# Patient Record
Sex: Male | Born: 1950
Health system: Southern US, Community
[De-identification: ages and names within clinical notes are randomized; demographics above are authoritative.]

## PROBLEM LIST (undated history)

## (undated) DIAGNOSIS — G8190 Hemiplegia, unspecified affecting unspecified side: Secondary | ICD-10-CM

## (undated) DIAGNOSIS — C4490 Unspecified malignant neoplasm of skin, unspecified: Secondary | ICD-10-CM

## (undated) DIAGNOSIS — I739 Peripheral vascular disease, unspecified: Secondary | ICD-10-CM

## (undated) DIAGNOSIS — E785 Hyperlipidemia, unspecified: Secondary | ICD-10-CM

## (undated) DIAGNOSIS — A809 Acute poliomyelitis, unspecified: Secondary | ICD-10-CM

## (undated) DIAGNOSIS — C801 Malignant (primary) neoplasm, unspecified: Secondary | ICD-10-CM

## (undated) DIAGNOSIS — M1990 Unspecified osteoarthritis, unspecified site: Secondary | ICD-10-CM

## (undated) DIAGNOSIS — J449 Chronic obstructive pulmonary disease, unspecified: Secondary | ICD-10-CM

## (undated) DIAGNOSIS — Z95818 Presence of other cardiac implants and grafts: Secondary | ICD-10-CM

## (undated) DIAGNOSIS — F32A Depression, unspecified: Secondary | ICD-10-CM

## (undated) DIAGNOSIS — J189 Pneumonia, unspecified organism: Secondary | ICD-10-CM

## (undated) DIAGNOSIS — K219 Gastro-esophageal reflux disease without esophagitis: Secondary | ICD-10-CM

## (undated) DIAGNOSIS — I639 Cerebral infarction, unspecified: Secondary | ICD-10-CM

## (undated) DIAGNOSIS — F329 Major depressive disorder, single episode, unspecified: Secondary | ICD-10-CM

## (undated) HISTORY — DX: Unspecified malignant neoplasm of skin, unspecified: C44.90

## (undated) HISTORY — PX: BRAIN TUMOR EXCISION: SHX577

## (undated) HISTORY — DX: Acute poliomyelitis, unspecified: A80.9

## (undated) HISTORY — DX: Unspecified osteoarthritis, unspecified site: M19.90

---

## 2011-04-09 HISTORY — PX: COLONOSCOPY: SHX174

## 2012-02-21 ENCOUNTER — Ambulatory Visit: Payer: Self-pay | Admitting: Unknown Physician Specialty

## 2014-03-14 ENCOUNTER — Ambulatory Visit: Payer: Self-pay | Admitting: Physician Assistant

## 2014-03-17 ENCOUNTER — Encounter: Payer: Self-pay | Admitting: General Surgery

## 2014-03-17 ENCOUNTER — Ambulatory Visit (INDEPENDENT_AMBULATORY_CARE_PROVIDER_SITE_OTHER): Payer: Worker's Compensation | Admitting: General Surgery

## 2014-03-17 VITALS — BP 130/82 | HR 80 | Resp 16 | Ht 67.0 in | Wt 132.0 lb

## 2014-03-17 DIAGNOSIS — K4090 Unilateral inguinal hernia, without obstruction or gangrene, not specified as recurrent: Secondary | ICD-10-CM

## 2014-03-17 NOTE — Progress Notes (Signed)
Patient ID: Jeremy Balfour., male   DOB: [birth year], 63 y.o.   MRN: [id number]  Chief Complaint  Patient presents with  . Hernia    HPI Jeremy Kenley Sr. is a 63 y.o. male.  Here today for evaluation of possible right inguinal hernia. The date of injury was Sunday night at work around 1:30 am on 03-14-14. He states he was lifting a piece of heavy equipment at work when he first noticed the right groin pain and felt "pull". He also states there is swelling in the area. Bowels are regular.  HPI  Past Medical History  Diagnosis Date  . Arthritis pain   . Polio     age 63    Past Surgical History  Procedure Laterality Date  . Colonoscopy  2013    Dr Vira Agar    Family History  Problem Relation Age of Onset  . Cancer Father     colon    Social History History  Substance Use Topics  . Smoking status: Current Every Day Smoker -- 1.00 packs/day for 40 years    Types: Cigarettes  . Smokeless tobacco: Never Used  . Alcohol Use: 0.0 oz/week    0 Not specified per week    No Known Allergies  No current outpatient prescriptions on file.   No current facility-administered medications for this visit.    Review of Systems Review of Systems  Constitutional: Negative.   Respiratory: Negative.   Cardiovascular: Negative.   Gastrointestinal: Negative for diarrhea and constipation.    Blood pressure 130/82, pulse 80, resp. rate 16, height 5\' 7"  (1.702 m), weight 132 lb (59.875 kg).  Physical Exam Physical Exam  Constitutional: He is oriented to person, place, and time. He appears well-developed and well-nourished.  Eyes: Conjunctivae are normal. No scleral icterus.  Neck: Neck supple.  Cardiovascular: Normal rate, regular rhythm and normal heart sounds.   Pulmonary/Chest: Effort normal and breath sounds normal.  Abdominal: Soft. Normal appearance and bowel sounds are normal. There is tenderness. A hernia is present. Hernia confirmed positive in the right  inguinal area.  Reducible right inguinal hernia , mildly tender right groin.   Lymphadenopathy:    He has no cervical adenopathy.  Neurological: He is alert and oriented to person, place, and time.  Skin: Skin is warm and dry.    Data Reviewed Referral note  Assessment    Right inguinal hernia. Likely this is cause of his pain but it is also possible he has a muscular starin from recent lifting. He does state he had no swelling in that area till now. Repair discusse in full. Given he is a a smoker would recommend open repair with mesh and can be done with local/sedation. Pt agreeable.     Plan    Hernia precautions and incarceration were discussed with the patient. If they develop symptoms of an incarcerated hernia, they were encouraged to seek prompt medical attention.  I have recommended repair of the hernia using mesh on an outpatient basis in the near future. The risk of infection was reviewed. The role of prosthetic mesh to minimize the risk of recurrence was reviewed.     Patient's surgery has been scheduled for 03-28-14 at [facility].  PCP:  Alex Gardener 03/17/2014, 2:28 PM

## 2014-03-17 NOTE — Patient Instructions (Addendum)

## 2014-03-28 ENCOUNTER — Ambulatory Visit: Payer: Self-pay | Admitting: General Surgery

## 2014-03-28 DIAGNOSIS — K4090 Unilateral inguinal hernia, without obstruction or gangrene, not specified as recurrent: Secondary | ICD-10-CM | POA: Diagnosis not present

## 2014-03-28 HISTORY — PX: HERNIA REPAIR: SHX51

## 2014-04-04 ENCOUNTER — Encounter: Payer: Self-pay | Admitting: General Surgery

## 2014-04-06 ENCOUNTER — Telehealth: Payer: Self-pay | Admitting: *Deleted

## 2014-04-06 NOTE — Telephone Encounter (Signed)
Advised he may try milk of magnesia or miralax, pt agrees.

## 2014-04-06 NOTE — Telephone Encounter (Signed)
Pt is calling because he had surgery on 03/28/14 and have taken a whole bottle of stool softners and has not went to the bathroom, he wanted to know what he should do.

## 2014-04-07 ENCOUNTER — Telehealth: Payer: Self-pay | Admitting: *Deleted

## 2014-04-07 NOTE — Telephone Encounter (Signed)
He will try dulcolax supp or fleets enema. He has not tried MOM as of yet.

## 2014-04-07 NOTE — Telephone Encounter (Signed)
Pt is still having trouble moving bowels after surgery, he feels bloated. He was wanting to know if there was an enemia he could use.

## 2014-04-14 ENCOUNTER — Encounter: Payer: Self-pay | Admitting: General Surgery

## 2014-04-14 ENCOUNTER — Ambulatory Visit (INDEPENDENT_AMBULATORY_CARE_PROVIDER_SITE_OTHER): Payer: Worker's Compensation | Admitting: General Surgery

## 2014-04-14 VITALS — BP 124/76 | HR 74 | Resp 12 | Ht 66.0 in | Wt 137.0 lb

## 2014-04-14 DIAGNOSIS — K4090 Unilateral inguinal hernia, without obstruction or gangrene, not specified as recurrent: Secondary | ICD-10-CM

## 2014-04-14 NOTE — Patient Instructions (Signed)
Patient to return in 1 month for follow up. Patient advised to start doing more things as tolerated. The patient is aware to call back for any questions or concerns.

## 2014-04-14 NOTE — Progress Notes (Signed)
Patient ID: Jeremy Harrell., male   DOB: 01/01/1951, 64 y.o.   MRN: [id number]  The patient presents for a post op right inguinal hernia repair. The procedure was performed on 03/28/14. He is doing well. No new complaints at this time.  He is still somewhat tender in right groin.   Repair intact. Incision is clean with no signs of infection or seroma  Patient to return in 1 month for follow up. He is asked to increase activity slowly as tolerated.

## 2014-04-15 ENCOUNTER — Encounter: Payer: Self-pay | Admitting: General Surgery

## 2014-05-12 ENCOUNTER — Encounter: Payer: Self-pay | Admitting: General Surgery

## 2014-05-12 ENCOUNTER — Ambulatory Visit: Payer: Self-pay | Admitting: General Surgery

## 2014-05-12 VITALS — BP 122/72 | HR 70 | Resp 12 | Ht 66.0 in | Wt 141.0 lb

## 2014-05-12 DIAGNOSIS — K4090 Unilateral inguinal hernia, without obstruction or gangrene, not specified as recurrent: Secondary | ICD-10-CM

## 2014-05-12 NOTE — Patient Instructions (Signed)
The patient is aware to call back for any questions or concerns.  

## 2014-05-12 NOTE — Progress Notes (Signed)
Here today for postoperative visit, right inguinal hernia 03-28-14. States he is doing well. Bowels moving regular. Still complains of some intermittent pain and also pain in the testicular area.   Exam shows well healed incision with intact repair. Moderate tenderness over the right testicle without any palpable nodule or visible swelling.   Advise use of Aleve daily for 2 wks and also requested Urology consult for testicular tenderness. Discussed fully with patient.  Return to work 05-30-14

## 2014-06-23 ENCOUNTER — Encounter: Payer: Self-pay | Admitting: General Surgery

## 2014-06-23 ENCOUNTER — Ambulatory Visit (INDEPENDENT_AMBULATORY_CARE_PROVIDER_SITE_OTHER): Payer: Self-pay | Admitting: General Surgery

## 2014-06-23 VITALS — BP 120/78 | HR 74 | Resp 12 | Ht 66.0 in | Wt 134.0 lb

## 2014-06-23 DIAGNOSIS — K4090 Unilateral inguinal hernia, without obstruction or gangrene, not specified as recurrent: Secondary | ICD-10-CM

## 2014-06-23 NOTE — Progress Notes (Signed)
Patient is here today for postoperative visit, right inguinal hernia 03-28-14. He states he doing ok.  Still has some pain over right pubic area but is getting better. Incision is intact and healing well. No hernia noted. Patient to return as needed.

## 2014-06-23 NOTE — Patient Instructions (Signed)
Patient to return as needed. 

## 2014-07-11 ENCOUNTER — Ambulatory Visit: Payer: Self-pay | Admitting: General Surgery

## 2014-07-30 NOTE — Op Note (Signed)
PATIENT NAME:  Jeremy Harrell, Jeremy Harrell MR#:  [id number] DATE OF BIRTH:  06/17/1950  DATE OF PROCEDURE:  03/28/2014  PREOPERATIVE DIAGNOSIS: Right inguinal hernia.   POSTOPERATIVE DIAGNOSIS: Right inguinal hernia.     OPERATION: Repair right inguinal hernia.   ANESTHESIA:  Local with a mixture of 0.5% Marcaine and 1% Xylocaine.   COMPLICATIONS: None.   ESTIMATED BLOOD LOSS: Minimal.   DRAINS: None.   DESCRIPTION OF PROCEDURE: The patient was placed in the supine position on the operating table. With adequate sedation and monitoring, the right groin was prepped and draped out as a sterile field. Timeout was performed and the anterior/superior iliac spine and the pubic tubercle area were marked, an incision was made along the medial two thirds of the line connecting these. Local anesthetic was used to a total of about 25-27 mL total. The incision was deepened through down to the external oblique and bleeding was controlled with cautery and ligatures of 3-0 Vicryl. The external oblique was opened along the line of its fibers and the contents were explored. The patient had an indirect sac along with a small lipoma of the cord.  The lipoma was excised and ligated with 3-0 Vicryl. The sac was then dissected free from the surrounding cord structures and it was somewhat adhered in places and this was carefully dissected all the way to the internal ring and a little bit higher. It was then opened to reveal there were no contents. Suture ligation of the sac was performed high with 2-0 Vicryl and was then amputated above this. The suture was used to transfix the stump to the undersurface of the internal oblique muscle. Fascia covering very cord structures was reapproximated with 2-0 Vicryl. The posterior wall was well-exposed. The Parietex Pro Grip mesh was brought up to the field. The edges were trimmed to match the small inguinal canal in this patient and placed around the cord and laid down. The medial end was  tacked to the pubic tubercle with a single stitch of 2-0 PDS and the lateral ends were tucked underneath the external oblique. After ensuring hemostasis, the wound was irrigated and the external oblique closed with running 2-0 PDS. The subcutaneous tissue was closed with 3-0 Vicryl and the skin closed with subcuticular 4-0 Vicryl, reinforced covered with LiquiBand.  The procedure was well tolerated.  He was subsequently extubated and returned to the recovery room in stable condition.    ____________________________ S.Robinette Haines, MD sgs:at D: 03/28/2014 13:23:00 ET T: 03/28/2014 14:51:00 ET JOB#: [id number]  cc: S.G. Jamal Collin, MD, <Dictator> [facility] Robinette Haines MD ELECTRONICALLY SIGNED 03/29/2014 8:44

## 2015-06-11 DIAGNOSIS — K5900 Constipation, unspecified: Secondary | ICD-10-CM | POA: Diagnosis not present

## 2015-06-11 DIAGNOSIS — I63032 Cerebral infarction due to thrombosis of left carotid artery: Secondary | ICD-10-CM | POA: Diagnosis not present

## 2015-06-11 DIAGNOSIS — I69391 Dysphagia following cerebral infarction: Secondary | ICD-10-CM | POA: Diagnosis not present

## 2015-06-11 DIAGNOSIS — I739 Peripheral vascular disease, unspecified: Secondary | ICD-10-CM | POA: Diagnosis not present

## 2015-06-11 DIAGNOSIS — R414 Neurologic neglect syndrome: Secondary | ICD-10-CM | POA: Diagnosis not present

## 2015-06-11 DIAGNOSIS — R471 Dysarthria and anarthria: Secondary | ICD-10-CM | POA: Diagnosis not present

## 2015-06-11 DIAGNOSIS — S0990XA Unspecified injury of head, initial encounter: Secondary | ICD-10-CM | POA: Diagnosis not present

## 2015-06-11 DIAGNOSIS — I160 Hypertensive urgency: Secondary | ICD-10-CM | POA: Diagnosis not present

## 2015-06-11 DIAGNOSIS — I69320 Aphasia following cerebral infarction: Secondary | ICD-10-CM | POA: Diagnosis not present

## 2015-06-11 DIAGNOSIS — J40 Bronchitis, not specified as acute or chronic: Secondary | ICD-10-CM | POA: Diagnosis not present

## 2015-06-11 DIAGNOSIS — H5347 Heteronymous bilateral field defects: Secondary | ICD-10-CM | POA: Diagnosis not present

## 2015-06-11 DIAGNOSIS — I63512 Cerebral infarction due to unspecified occlusion or stenosis of left middle cerebral artery: Secondary | ICD-10-CM | POA: Diagnosis not present

## 2015-06-11 DIAGNOSIS — R4701 Aphasia: Secondary | ICD-10-CM | POA: Diagnosis not present

## 2015-06-11 DIAGNOSIS — R29722 NIHSS score 22: Secondary | ICD-10-CM | POA: Diagnosis not present

## 2015-06-11 DIAGNOSIS — I63232 Cerebral infarction due to unspecified occlusion or stenosis of left carotid arteries: Secondary | ICD-10-CM | POA: Diagnosis not present

## 2015-06-11 DIAGNOSIS — M6281 Muscle weakness (generalized): Secondary | ICD-10-CM | POA: Diagnosis not present

## 2015-06-11 DIAGNOSIS — E785 Hyperlipidemia, unspecified: Secondary | ICD-10-CM | POA: Diagnosis not present

## 2015-06-11 DIAGNOSIS — R29720 NIHSS score 20: Secondary | ICD-10-CM | POA: Diagnosis not present

## 2015-06-11 DIAGNOSIS — I10 Essential (primary) hypertension: Secondary | ICD-10-CM | POA: Diagnosis not present

## 2015-06-11 DIAGNOSIS — I491 Atrial premature depolarization: Secondary | ICD-10-CM | POA: Diagnosis not present

## 2015-06-11 DIAGNOSIS — I639 Cerebral infarction, unspecified: Secondary | ICD-10-CM | POA: Diagnosis not present

## 2015-06-11 DIAGNOSIS — R482 Apraxia: Secondary | ICD-10-CM | POA: Diagnosis not present

## 2015-06-11 DIAGNOSIS — F17210 Nicotine dependence, cigarettes, uncomplicated: Secondary | ICD-10-CM | POA: Diagnosis not present

## 2015-06-11 DIAGNOSIS — R29898 Other symptoms and signs involving the musculoskeletal system: Secondary | ICD-10-CM | POA: Diagnosis not present

## 2015-06-11 DIAGNOSIS — R1310 Dysphagia, unspecified: Secondary | ICD-10-CM | POA: Diagnosis not present

## 2015-06-11 DIAGNOSIS — Z9282 Status post administration of tPA (rtPA) in a different facility within the last 24 hours prior to admission to current facility: Secondary | ICD-10-CM | POA: Diagnosis not present

## 2015-06-11 DIAGNOSIS — R911 Solitary pulmonary nodule: Secondary | ICD-10-CM | POA: Diagnosis not present

## 2015-06-11 DIAGNOSIS — J449 Chronic obstructive pulmonary disease, unspecified: Secondary | ICD-10-CM | POA: Diagnosis not present

## 2015-06-11 DIAGNOSIS — G8191 Hemiplegia, unspecified affecting right dominant side: Secondary | ICD-10-CM | POA: Diagnosis not present

## 2015-06-27 DIAGNOSIS — I69320 Aphasia following cerebral infarction: Secondary | ICD-10-CM | POA: Diagnosis not present

## 2015-06-27 DIAGNOSIS — Z7982 Long term (current) use of aspirin: Secondary | ICD-10-CM | POA: Diagnosis not present

## 2015-06-27 DIAGNOSIS — I69351 Hemiplegia and hemiparesis following cerebral infarction affecting right dominant side: Secondary | ICD-10-CM | POA: Diagnosis not present

## 2015-06-27 DIAGNOSIS — I69322 Dysarthria following cerebral infarction: Secondary | ICD-10-CM | POA: Diagnosis not present

## 2015-06-27 DIAGNOSIS — R1310 Dysphagia, unspecified: Secondary | ICD-10-CM | POA: Diagnosis not present

## 2015-06-27 DIAGNOSIS — Z720 Tobacco use: Secondary | ICD-10-CM | POA: Diagnosis not present

## 2015-06-27 DIAGNOSIS — I69391 Dysphagia following cerebral infarction: Secondary | ICD-10-CM | POA: Diagnosis not present

## 2015-06-28 DIAGNOSIS — I69322 Dysarthria following cerebral infarction: Secondary | ICD-10-CM | POA: Diagnosis not present

## 2015-06-28 DIAGNOSIS — Z7982 Long term (current) use of aspirin: Secondary | ICD-10-CM | POA: Diagnosis not present

## 2015-06-28 DIAGNOSIS — Z720 Tobacco use: Secondary | ICD-10-CM | POA: Diagnosis not present

## 2015-06-28 DIAGNOSIS — I69320 Aphasia following cerebral infarction: Secondary | ICD-10-CM | POA: Diagnosis not present

## 2015-06-28 DIAGNOSIS — R1310 Dysphagia, unspecified: Secondary | ICD-10-CM | POA: Diagnosis not present

## 2015-06-28 DIAGNOSIS — I69391 Dysphagia following cerebral infarction: Secondary | ICD-10-CM | POA: Diagnosis not present

## 2015-06-28 DIAGNOSIS — I69351 Hemiplegia and hemiparesis following cerebral infarction affecting right dominant side: Secondary | ICD-10-CM | POA: Diagnosis not present

## 2015-06-30 DIAGNOSIS — I69351 Hemiplegia and hemiparesis following cerebral infarction affecting right dominant side: Secondary | ICD-10-CM | POA: Diagnosis not present

## 2015-06-30 DIAGNOSIS — Z7982 Long term (current) use of aspirin: Secondary | ICD-10-CM | POA: Diagnosis not present

## 2015-06-30 DIAGNOSIS — I69391 Dysphagia following cerebral infarction: Secondary | ICD-10-CM | POA: Diagnosis not present

## 2015-06-30 DIAGNOSIS — I69322 Dysarthria following cerebral infarction: Secondary | ICD-10-CM | POA: Diagnosis not present

## 2015-06-30 DIAGNOSIS — I69320 Aphasia following cerebral infarction: Secondary | ICD-10-CM | POA: Diagnosis not present

## 2015-06-30 DIAGNOSIS — Z720 Tobacco use: Secondary | ICD-10-CM | POA: Diagnosis not present

## 2015-06-30 DIAGNOSIS — R1310 Dysphagia, unspecified: Secondary | ICD-10-CM | POA: Diagnosis not present

## 2015-07-03 DIAGNOSIS — R1310 Dysphagia, unspecified: Secondary | ICD-10-CM | POA: Diagnosis not present

## 2015-07-03 DIAGNOSIS — Z720 Tobacco use: Secondary | ICD-10-CM | POA: Diagnosis not present

## 2015-07-03 DIAGNOSIS — I69351 Hemiplegia and hemiparesis following cerebral infarction affecting right dominant side: Secondary | ICD-10-CM | POA: Diagnosis not present

## 2015-07-03 DIAGNOSIS — Z7982 Long term (current) use of aspirin: Secondary | ICD-10-CM | POA: Diagnosis not present

## 2015-07-03 DIAGNOSIS — I69322 Dysarthria following cerebral infarction: Secondary | ICD-10-CM | POA: Diagnosis not present

## 2015-07-03 DIAGNOSIS — I69391 Dysphagia following cerebral infarction: Secondary | ICD-10-CM | POA: Diagnosis not present

## 2015-07-03 DIAGNOSIS — I69320 Aphasia following cerebral infarction: Secondary | ICD-10-CM | POA: Diagnosis not present

## 2015-07-06 DIAGNOSIS — I69320 Aphasia following cerebral infarction: Secondary | ICD-10-CM | POA: Diagnosis not present

## 2015-07-06 DIAGNOSIS — I69322 Dysarthria following cerebral infarction: Secondary | ICD-10-CM | POA: Diagnosis not present

## 2015-07-06 DIAGNOSIS — R1310 Dysphagia, unspecified: Secondary | ICD-10-CM | POA: Diagnosis not present

## 2015-07-06 DIAGNOSIS — I69351 Hemiplegia and hemiparesis following cerebral infarction affecting right dominant side: Secondary | ICD-10-CM | POA: Diagnosis not present

## 2015-07-06 DIAGNOSIS — Z7982 Long term (current) use of aspirin: Secondary | ICD-10-CM | POA: Diagnosis not present

## 2015-07-06 DIAGNOSIS — I69391 Dysphagia following cerebral infarction: Secondary | ICD-10-CM | POA: Diagnosis not present

## 2015-07-06 DIAGNOSIS — Z720 Tobacco use: Secondary | ICD-10-CM | POA: Diagnosis not present

## 2015-07-10 DIAGNOSIS — I69391 Dysphagia following cerebral infarction: Secondary | ICD-10-CM | POA: Diagnosis not present

## 2015-07-10 DIAGNOSIS — I69320 Aphasia following cerebral infarction: Secondary | ICD-10-CM | POA: Diagnosis not present

## 2015-07-10 DIAGNOSIS — I69351 Hemiplegia and hemiparesis following cerebral infarction affecting right dominant side: Secondary | ICD-10-CM | POA: Diagnosis not present

## 2015-07-10 DIAGNOSIS — E785 Hyperlipidemia, unspecified: Secondary | ICD-10-CM | POA: Diagnosis not present

## 2015-07-10 DIAGNOSIS — I69322 Dysarthria following cerebral infarction: Secondary | ICD-10-CM | POA: Diagnosis not present

## 2015-07-10 DIAGNOSIS — I69359 Hemiplegia and hemiparesis following cerebral infarction affecting unspecified side: Secondary | ICD-10-CM | POA: Diagnosis not present

## 2015-07-10 DIAGNOSIS — Z7982 Long term (current) use of aspirin: Secondary | ICD-10-CM | POA: Diagnosis not present

## 2015-07-10 DIAGNOSIS — R1310 Dysphagia, unspecified: Secondary | ICD-10-CM | POA: Diagnosis not present

## 2015-07-10 DIAGNOSIS — Z1159 Encounter for screening for other viral diseases: Secondary | ICD-10-CM | POA: Diagnosis not present

## 2015-07-10 DIAGNOSIS — Z125 Encounter for screening for malignant neoplasm of prostate: Secondary | ICD-10-CM | POA: Diagnosis not present

## 2015-07-10 DIAGNOSIS — Z720 Tobacco use: Secondary | ICD-10-CM | POA: Diagnosis not present

## 2015-07-10 DIAGNOSIS — Z8673 Personal history of transient ischemic attack (TIA), and cerebral infarction without residual deficits: Secondary | ICD-10-CM | POA: Diagnosis not present

## 2015-07-13 DIAGNOSIS — I69391 Dysphagia following cerebral infarction: Secondary | ICD-10-CM | POA: Diagnosis not present

## 2015-07-13 DIAGNOSIS — R1310 Dysphagia, unspecified: Secondary | ICD-10-CM | POA: Diagnosis not present

## 2015-07-13 DIAGNOSIS — I69320 Aphasia following cerebral infarction: Secondary | ICD-10-CM | POA: Diagnosis not present

## 2015-07-13 DIAGNOSIS — Z7982 Long term (current) use of aspirin: Secondary | ICD-10-CM | POA: Diagnosis not present

## 2015-07-13 DIAGNOSIS — Z720 Tobacco use: Secondary | ICD-10-CM | POA: Diagnosis not present

## 2015-07-13 DIAGNOSIS — I69322 Dysarthria following cerebral infarction: Secondary | ICD-10-CM | POA: Diagnosis not present

## 2015-07-13 DIAGNOSIS — I69351 Hemiplegia and hemiparesis following cerebral infarction affecting right dominant side: Secondary | ICD-10-CM | POA: Diagnosis not present

## 2015-07-25 DIAGNOSIS — Z7982 Long term (current) use of aspirin: Secondary | ICD-10-CM | POA: Diagnosis not present

## 2015-07-25 DIAGNOSIS — I69322 Dysarthria following cerebral infarction: Secondary | ICD-10-CM | POA: Diagnosis not present

## 2015-07-25 DIAGNOSIS — I69351 Hemiplegia and hemiparesis following cerebral infarction affecting right dominant side: Secondary | ICD-10-CM | POA: Diagnosis not present

## 2015-07-25 DIAGNOSIS — I69320 Aphasia following cerebral infarction: Secondary | ICD-10-CM | POA: Diagnosis not present

## 2015-07-25 DIAGNOSIS — I69391 Dysphagia following cerebral infarction: Secondary | ICD-10-CM | POA: Diagnosis not present

## 2015-07-25 DIAGNOSIS — Z720 Tobacco use: Secondary | ICD-10-CM | POA: Diagnosis not present

## 2015-07-25 DIAGNOSIS — R1310 Dysphagia, unspecified: Secondary | ICD-10-CM | POA: Diagnosis not present

## 2015-07-27 DIAGNOSIS — I69391 Dysphagia following cerebral infarction: Secondary | ICD-10-CM | POA: Diagnosis not present

## 2015-07-27 DIAGNOSIS — I69320 Aphasia following cerebral infarction: Secondary | ICD-10-CM | POA: Diagnosis not present

## 2015-07-27 DIAGNOSIS — I69322 Dysarthria following cerebral infarction: Secondary | ICD-10-CM | POA: Diagnosis not present

## 2015-07-27 DIAGNOSIS — Z7982 Long term (current) use of aspirin: Secondary | ICD-10-CM | POA: Diagnosis not present

## 2015-07-27 DIAGNOSIS — Z720 Tobacco use: Secondary | ICD-10-CM | POA: Diagnosis not present

## 2015-07-27 DIAGNOSIS — I69351 Hemiplegia and hemiparesis following cerebral infarction affecting right dominant side: Secondary | ICD-10-CM | POA: Diagnosis not present

## 2015-07-27 DIAGNOSIS — R1310 Dysphagia, unspecified: Secondary | ICD-10-CM | POA: Diagnosis not present

## 2015-07-31 DIAGNOSIS — I69320 Aphasia following cerebral infarction: Secondary | ICD-10-CM | POA: Diagnosis not present

## 2015-07-31 DIAGNOSIS — Z720 Tobacco use: Secondary | ICD-10-CM | POA: Diagnosis not present

## 2015-07-31 DIAGNOSIS — I69391 Dysphagia following cerebral infarction: Secondary | ICD-10-CM | POA: Diagnosis not present

## 2015-07-31 DIAGNOSIS — R1310 Dysphagia, unspecified: Secondary | ICD-10-CM | POA: Diagnosis not present

## 2015-07-31 DIAGNOSIS — I69322 Dysarthria following cerebral infarction: Secondary | ICD-10-CM | POA: Diagnosis not present

## 2015-07-31 DIAGNOSIS — Z7982 Long term (current) use of aspirin: Secondary | ICD-10-CM | POA: Diagnosis not present

## 2015-07-31 DIAGNOSIS — I69351 Hemiplegia and hemiparesis following cerebral infarction affecting right dominant side: Secondary | ICD-10-CM | POA: Diagnosis not present

## 2015-08-03 DIAGNOSIS — R1310 Dysphagia, unspecified: Secondary | ICD-10-CM | POA: Diagnosis not present

## 2015-08-03 DIAGNOSIS — Z720 Tobacco use: Secondary | ICD-10-CM | POA: Diagnosis not present

## 2015-08-03 DIAGNOSIS — I69351 Hemiplegia and hemiparesis following cerebral infarction affecting right dominant side: Secondary | ICD-10-CM | POA: Diagnosis not present

## 2015-08-03 DIAGNOSIS — I69322 Dysarthria following cerebral infarction: Secondary | ICD-10-CM | POA: Diagnosis not present

## 2015-08-03 DIAGNOSIS — I69391 Dysphagia following cerebral infarction: Secondary | ICD-10-CM | POA: Diagnosis not present

## 2015-08-03 DIAGNOSIS — I69320 Aphasia following cerebral infarction: Secondary | ICD-10-CM | POA: Diagnosis not present

## 2015-08-03 DIAGNOSIS — Z7982 Long term (current) use of aspirin: Secondary | ICD-10-CM | POA: Diagnosis not present

## 2015-08-08 ENCOUNTER — Ambulatory Visit: Payer: PPO | Attending: Internal Medicine | Admitting: Occupational Therapy

## 2015-08-08 ENCOUNTER — Encounter: Payer: Self-pay | Admitting: Occupational Therapy

## 2015-08-08 DIAGNOSIS — M6281 Muscle weakness (generalized): Secondary | ICD-10-CM | POA: Diagnosis not present

## 2015-08-08 DIAGNOSIS — R262 Difficulty in walking, not elsewhere classified: Secondary | ICD-10-CM | POA: Insufficient documentation

## 2015-08-08 DIAGNOSIS — R278 Other lack of coordination: Secondary | ICD-10-CM | POA: Diagnosis not present

## 2015-08-08 DIAGNOSIS — R4701 Aphasia: Secondary | ICD-10-CM | POA: Insufficient documentation

## 2015-08-10 ENCOUNTER — Encounter: Payer: Self-pay | Admitting: Occupational Therapy

## 2015-08-11 NOTE — Therapy (Signed)
[place] MAIN [facility] SERVICES [address] [place], [place], [address] Phone: [phone]   Fax:  [phone]  Occupational Therapy Evaluation  Patient Details  Name: Jeremy Corbeil Sr. MRN: [id number] Date of Birth: Dec 13, 1950 Referring Provider: Ramonita Lab  Encounter Date: 08/08/2015      OT End of Session - 08/11/15 1053    Visit Number 1   Number of Visits 16   Date for OT Re-Evaluation 10/03/15   Authorization Type Medicare G codes 1   OT Start Time 0959   OT Stop Time 1056   OT Time Calculation (min) 57 min   Activity Tolerance Patient tolerated treatment well   Behavior During Therapy [facility] for tasks assessed/performed      Past Medical History  Diagnosis Date  . Arthritis pain   . Polio     age 47    Past Surgical History  Procedure Laterality Date  . Colonoscopy  2013    Dr Vira Agar  . Hernia repair Right 03/28/14    inguinal hernia repair    There were no vitals filed for this visit.      Subjective Assessment - 08/11/15 1048    Subjective  Patient reports the morning he had the stroke, he woke up and fell off the bed.  Wife called EMS, he was transported to [facility] where he was admitted and stayed for 1.5 weeks.    Patient is accompained by: Family member   Patient Stated Goals Use my right arm and leg again, write again and talk better.           [facility] OT Assessment - 08/11/15 1048    Assessment   Diagnosis CVA   Referring Provider klein, bert   Onset Date 06/10/15   Prior Therapy PT, SLP   Balance Screen   Has the patient fallen in the past 6 months Yes   How many times? once out of bed when CVA occurred   Home  Environment   Family/patient expects to be discharged to: Private residence   Living Arrangements Spouse/significant other   Available Help at Discharge Family   Type of [place] One level   Bathroom Building control surveyor;Door   Product/process development scientist - 2 wheels   Lives With Spouse   Prior Function   Level of Independence Independent   Vocation Retired   ADL   Eating/Feeding Modified independent   Grooming Minimal assistance   Upper Body Bathing Modified independent   Lower Body Bathing Modified independent   Upper Body Dressing Independent   Lower Body Dressing Minimal assistance   Toilet Tranfer Modified independent   Toileting - Clothing Manipulation Modified independent   Toileting -  Hygiene Modified Independent   Tub/Shower Transfer Modified independent   ADL comments Patient demonstrates difficulty with RUE holding objects, lifting objects, opening jars, handwriting, picking up small objects such as coins, higher level household chores, yard work and putting on socks.   IADL   Prior Level of Function Shopping independent   Shopping Assistance for transportation   Prior Level of Function Light Housekeeping independent   Light Housekeeping Performs light daily tasks such as dishwashing, bed making   Prior Level of Function Meal Prep independent   Meal Prep Able to complete simple cold meal and snack prep   Prior Level of Function Community Mobility independent  Community Mobility Relies on family or friends for transportation   Medication Management Is responsible for taking medication in correct dosages at correct time   Prior Level of Function Financial Management independent   Financial Management Requires assistance   Mobility   Mobility Status Independent   Mobility Status Comments no longer using walker   Vision - History   Baseline Vision Wears glasses only for reading   Cognition   Overall Cognitive Status Impaired/Different from baseline   Memory Impaired   Memory Impairment Decreased short term memory   Sensation   Light Touch Appears Intact   Stereognosis Appears Intact   Proprioception Appears Intact   Coordination   Gross Motor  Movements are Fluid and Coordinated No   Fine Motor Movements are Fluid and Coordinated No   Finger Nose Finger Test impaired on right   9 Hole Peg Test Right;Left   Right 9 Hole Peg Test 35   Left 9 Hole Peg Test 22   ROM / Strength   AROM / PROM / Strength AROM;Strength   AROM   Overall AROM  Within functional limits for tasks performed   Overall AROM Comments slight decrease in motion on right UE than compared to left   Strength   Overall Strength Deficits   Overall Strength Comments RUE 4/5 overall, LUE 5/5   Hand Function   Right Hand Grip (lbs) 40   Right Hand Lateral Pinch 16 lbs   Right Hand 3 Point Pinch 14 lbs   Left Hand Grip (lbs) 62   Left Hand Lateral Pinch 13 lbs   Left 3 point pinch 13 lbs          Patient seen for handwriting skills this date with use of a variety of pens, regular and built up handles.               OT Education - 08/11/15 1053    Education provided Yes   Education Details Role of OT, goals, exercises   Person(s) Educated Patient   Methods Explanation;Demonstration;Verbal cues   Comprehension Verbalized understanding;Returned demonstration;Verbal cues required;Need further instruction             OT Long Term Goals - 08/11/15 1107    OT LONG TERM GOAL #1   Title Patient will improve RUE strength by 1 mm grade to be able to lift items up to 8# without difficulty.     Baseline difficulty with using right hand to lift items such as milk jug.    Time 8   Period Weeks   Status New   OT LONG TERM GOAL #2   Title Patient will increase right hand grip strength by 10# to be able to open jars and containers with modified independence.   Baseline unable   Time 8   Period Weeks   Status New   OT LONG TERM GOAL #3   Title Patient will improve right hand coordination to write his name and address on important papers with 90% legibility.   Baseline legibility less than 25%.   Time 8   Period Weeks   Status New   OT LONG TERM  GOAL #4   Title Patient will complete lower body dressing with modified independence including donning socks.   Baseline difficulty with donning socks and needs occasional assist.   Time 8   Period Weeks   Status New   OT LONG TERM GOAL #5   Title Patient will perform house hold chores and yard work  with supervision.   Baseline unable    Time 8   Period Weeks   Status New   Long Term Additional Goals   Additional Long Term Goals Yes   OT LONG TERM GOAL #6   Title Patient will improve fine motor coordination to pick up and manipulate coins to make change independently.   Baseline difficulty with manipulation of small objects.   Time 8   Period Weeks   Status New               Plan - 08/11/15 1054    Clinical Impression Statement Patient is a 65 yo male who reports he woke up on 06-10-2015, fell out of bed.  His wife called EMS and he was transported to [facility] where he was diagnosed wtih a stroke and in the hospital for 1.5 weeks.  He discharged home where he had home health speech and PT, his last day was last Thursday.  He was seen for OT evaluation this date where he presents with RUE muscle weakness, decreased coordination skills, decreased functional mobility for distance walking and decreased ability to perform self care tasks.  He has difficulty with right hand use for holding items, lifting items, opening jars, handwriting legibility, manipulation of small objects, higher level IADL tasks such as household chores/yard work and donning socks.  He would benefit from skilled OT to maximize his safety and independence in necessary daily tasks.    Rehab Potential Good   OT Frequency 2x / week   OT Duration 8 weeks   OT Treatment/Interventions Self-care/ADL training;Therapeutic exercise;Patient/family education;Neuromuscular education;Manual Therapy;Balance training;Therapeutic exercises;DME and/or AE instruction;Therapeutic activities;Cognitive remediation/compensation   Consulted  and Agree with Plan of Care Patient      Patient will benefit from skilled therapeutic intervention in order to improve the following deficits and impairments:  Decreased activity tolerance, Decreased knowledge of use of DME, Decreased strength, Impaired flexibility, Decreased balance, Decreased cognition, Decreased range of motion, Decreased coordination, Impaired UE functional use, Difficulty walking, Decreased mobility  Visit Diagnosis: Muscle weakness (generalized) - Plan: Ot plan of care cert/re-cert  Other lack of coordination - Plan: Ot plan of care cert/re-cert    Problem List There are no active problems to display for this patient.  Achilles Dunk, OTR/L, CLT  Arlander Gillen 08/11/2015, 11:14 AM  [place] MAIN [facility] SERVICES [address] [place], [place], [address] Phone: [phone]   Fax:  [phone]  Name: Jeremy Sanda Sr. MRN: [id number] Date of Birth: [birth year]

## 2015-08-14 ENCOUNTER — Ambulatory Visit: Payer: PPO | Admitting: Physical Therapy

## 2015-08-14 ENCOUNTER — Ambulatory Visit: Payer: PPO | Admitting: Occupational Therapy

## 2015-08-14 ENCOUNTER — Encounter: Payer: Self-pay | Admitting: Physical Therapy

## 2015-08-14 DIAGNOSIS — R278 Other lack of coordination: Secondary | ICD-10-CM

## 2015-08-14 DIAGNOSIS — M6281 Muscle weakness (generalized): Secondary | ICD-10-CM

## 2015-08-14 DIAGNOSIS — R262 Difficulty in walking, not elsewhere classified: Secondary | ICD-10-CM

## 2015-08-14 NOTE — Therapy (Signed)
[place] MAIN [facility] SERVICES [address] [place], [place], [address] Phone: [phone]   Fax:  [phone]  Physical Therapy Evaluation  Patient Details  Name: Jeremy Goon Sr. MRN: [id number] Date of Birth: [birth year] Referring Provider: Dr. Kerrin Mo  Encounter Date: 08/14/2015      PT End of Session - 08/14/15 1705    Visit Number 1   Number of Visits 17   Date for PT Re-Evaluation 10/02/15   PT Start Time 0405   PT Stop Time 0445   PT Time Calculation (min) 40 min   Equipment Utilized During Treatment Gait belt      Past Medical History  Diagnosis Date  . Arthritis pain   . Polio     age 65    Past Surgical History  Procedure Laterality Date  . Colonoscopy  2013    Dr Vira Agar  . Hernia repair Right 03/28/14    inguinal hernia repair    There were no vitals filed for this visit.       Subjective Assessment - 08/14/15 1610    Subjective Pateint  is a little unsteady with his walking and his RLE gets tired.    Pertinent History cva 2 months ago   Currently in Pain? No/denies            [facility] PT Assessment - 08/14/15 0001    Assessment   Medical Diagnosis CVA   Referring Provider Dr. Kerrin Mo   Onset Date/Surgical Date 06/11/15   Hand Dominance Right   Next MD Visit June 2017   Prior Therapy in patient   Precautions   Precautions Fall   Restrictions   Weight Bearing Restrictions No   Balance Screen   Has the patient fallen in the past 6 months Yes   How many times? 1   Has the patient had a decrease in activity level because of a fear of falling?  Yes   Is the patient reluctant to leave their home because of a fear of falling?  Yes   [place] Private residence   Living Arrangements Spouse/significant other   Available Help at Discharge Family   Type of [place] to enter   Entrance Stairs-Number of Steps 3   Entrance Stairs-Rails None   Home Layout One level   [place] None   Prior Function   Level of Independence Independent   Vocation Retired        PAIN: no reports of pain  POSTURE: WFL   PROM/AROM:  STRENGTH:  Graded on a 0-5 scale Muscle Group Left Right  Shoulder flex    Shoulder Abd    Shoulder Ext    Shoulder IR/ER    Elbow    Wrist/hand    Hip Flex 5 5  Hip Abd 5 4  Hip Add 5 4  Hip Ext 2 2  Hip IR/ER 4 4  Knee Flex 4 5  Knee Ext 4 5  Ankle DF 5 5  Ankle PF 4 4   SENSATION:Intact   FUNCTIONAL MOBILITY: independent   BALANCE: Unable to tandem stand   GAIT: decreased gait speed, decreased arm swing  OUTCOME MEASURES: TEST Outcome Interpretation  5 times sit<>stand 16.95sec >60 yo, >15 sec indicates increased risk for falls  10 meter walk test    1.00             m/s <1.0 m/s indicates increased  risk for falls; limited community ambulator  Timed up and Go  12.5               sec <14 sec indicates increased risk for falls  6 minute walk test 1190               Feet 1000 feet is community ambulator                                 PT Education - Aug 22, 2015 1656    Education provided (p) Yes             PT Long Term Goals - 2015/08/22 1636    PT LONG TERM GOAL #1   Title Patient will be independent in home exercise program to improve strength/mobility for better functional independence with ADLs   Time 8   Period Weeks   Status New   PT LONG TERM GOAL #2   Title Patient (> 66 years old) will complete five times sit to stand test in < 15 seconds indicating an increased LE strength and improved balance.   Time 8   Period Weeks   Status New   PT LONG TERM GOAL #3   Title . Patient will increase 10 meter walk test to >1.40m/s as to improve gait speed for better community ambulation and to reduce fall risk   Time 8   Period Weeks   Status New               Plan - 08-22-2015 1700    Clinical Impression Statement Patient  is s/ p CVA and has  deficits of weakness in RLE, difficulty walking with decreased step height and decreased long distance ambulation ability.    Rehab Potential Good   Clinical Impairments Affecting Rehab Potential  impaired standing balance, weakess RLE hip add/extension and PF B ankles   PT Frequency 2x / week   PT Duration 8 weeks   PT Treatment/Interventions Therapeutic activities;Therapeutic exercise;Balance training;Functional mobility training;Gait training;Stair training   PT Next Visit Plan strengthening and balance training      Patient will benefit from skilled therapeutic intervention in order to improve the following deficits and impairments:  Abnormal gait, Decreased mobility, Decreased activity tolerance, Decreased endurance, Decreased strength, Decreased balance, Difficulty walking  Visit Diagnosis: Muscle weakness (generalized)  Difficulty in walking, not elsewhere classified      G-Codes - Aug 22, 2015 1653    Functional Assessment Tool Used 5 x sit to stand, TUg, 10 MW, 6 MW   Functional Limitation Mobility: Walking and moving around   Mobility: Walking and Moving Around Current Status [phone]) At least 20 percent but less than 40 percent impaired, limited or restricted   Mobility: Walking and Moving Around Goal Status [phone]) At least 1 percent but less than 20 percent impaired, limited or restricted       Problem List There are no active problems to display for this patient.  Alanson Puls, PT, [name] [place], Minette Headland S 08-22-2015, 5:06 PM  [place] MAIN [facility] SERVICES [address] [place], [place], [address] Phone: [phone]   Fax:  [phone]  Name: Jeremy Jahoda Sr. MRN: [id number] Date of Birth: 1950-09-28

## 2015-08-14 NOTE — Therapy (Signed)
[place] MAIN [facility] SERVICES [address] [place], [place], [address] Phone: [phone]   Fax:  [phone]  Occupational Therapy Treatment  Patient Details  Name: Jeremy Mcannally Sr. MRN: [id number] Date of Birth: 1951/02/06 Referring Provider: Ramonita Lab  Encounter Date: 08/14/2015      OT End of Session - 08/14/15 1707    Visit Number 2   Number of Visits 16   Date for OT Re-Evaluation 10/03/15   Authorization Type Medicare G codes 2   OT Start Time [id number]   OT Stop Time 1600   OT Time Calculation (min) 45 min   Activity Tolerance Patient tolerated treatment well   Behavior During Therapy [facility] for tasks assessed/performed      Past Medical History  Diagnosis Date  . Arthritis pain   . Polio     age 65    Past Surgical History  Procedure Laterality Date  . Colonoscopy  2013    Dr Vira Agar  . Hernia repair Right 03/28/14    inguinal hernia repair    There were no vitals filed for this visit.      Subjective Assessment - 08/14/15 1706    Subjective  Patient reports the morning he had the stroke, he woke up and fell off the bed.  Wife called EMS, he was transported to [facility] where he was admitted and stayed for 1.5 weeks.    Patient is accompained by: Family member   Patient Stated Goals Use my right arm and leg again, write again and talk better.        OT TREATMENT    Neuro muscular re-education:  Pt. Worked on grasping coins from a tabletop surface, placing them into a resistive container, and pushing them through the slot while isolating his 2nd digit. Pt. Had difficulty grasping them from the tabletop surface. Pt. Worked on a wrist maze while maintaining grip on the handle, with and without a 2# weight.  Therapeutic Exercise: Pt. worked on strengthening his RUE with a 2# dumbbell weight, for elbow flexion, extension, forearm supination, pronation, wrist flexion extension, radial/ulnar deviation. Cues were  required for proper position of the RUE. Pt. performed gross gripping with grip strengthener. Pt. Required cues for the placement and position of the gripper. Pt. Worked on the digiflex to isolate each digit flexion. Pt. Had difficulty with this. Pt. worked on pinch strengthening in the right hand for lateral, 3pt., and 2pt. pinch using yellow, red, green, blue, and black resistive clips.Tactlie and verbal cues were required difficulty gripping for eliciting the desired movement, and wrist extension. Pt. Worked on grasping coins from a tabletop surface, placing them into a resistive container, and pushing them through the slot while isolating his 2nd digit. Pt. Had difficulty grasping them from the tabletop surface. Pt. Worked on a wrist maze while maintaining grip an the handle. With and without 2# weight on the handle.                               OT Long Term Goals - 08/11/15 1107    OT LONG TERM GOAL #1   Title Patient will improve RUE strength by 1 mm grade to be able to lift items up to 8# without difficulty.     Baseline difficulty with using right hand to lift items such as milk jug.    Time 8   Period Weeks   Status New  OT LONG TERM GOAL #2   Title Patient will increase right hand grip strength by 10# to be able to open jars and containers with modified independence.   Baseline unable   Time 8   Period Weeks   Status New   OT LONG TERM GOAL #3   Title Patient will improve right hand coordination to write his name and address on important papers with 90% legibility.   Baseline legibility less than 25%.   Time 8   Period Weeks   Status New   OT LONG TERM GOAL #4   Title Patient will complete lower body dressing with modified independence including donning socks.   Baseline difficulty with donning socks and needs occasional assist.   Time 8   Period Weeks   Status New   OT LONG TERM GOAL #5   Title Patient will perform house hold chores and yard work  with supervision.   Baseline unable    Time 8   Period Weeks   Status New   Long Term Additional Goals   Additional Long Term Goals Yes   OT LONG TERM GOAL #6   Title Patient will improve fine motor coordination to pick up and manipulate coins to make change independently.   Baseline difficulty with manipulation of small objects.   Time 8   Period Weeks   Status New               Plan - 08/14/15 1709    Clinical Impression Statement Pt. continues to work on improving RUE strength, and coordination. Pt. continues to have difficulty lifting, gripping objects, and grasping/picking up small, flat objects.Pt. conitnues to require work on improving RUE strength, coordination, and hand function for use during ADL and IADL tasks.   Rehab Potential Good   OT Frequency 2x / week   OT Duration 8 weeks   OT Treatment/Interventions Self-care/ADL training;Therapeutic exercise;Patient/family education;Neuromuscular education;Manual Therapy;Balance training;Therapeutic exercises;DME and/or AE instruction;Therapeutic activities;Cognitive remediation/compensation   Consulted and Agree with Plan of Care Patient      Patient will benefit from skilled therapeutic intervention in order to improve the following deficits and impairments:  Decreased activity tolerance, Decreased knowledge of use of DME, Decreased strength, Impaired flexibility, Decreased balance, Decreased cognition, Decreased range of motion, Decreased coordination, Impaired UE functional use, Difficulty walking, Decreased mobility  Visit Diagnosis: Other lack of coordination  Muscle weakness (generalized)    Problem List There are no active problems to display for this patient.  Harrel Carina, MS, OTR/L  Harrel Carina 08/14/2015, 5:26 PM  [place] MAIN [facility] SERVICES [address] [place], [place], [address] Phone: [phone]   Fax:  [phone]  Name: Jeremy Nakanishi Sr. MRN: [id number] Date of Birth: [birth year]

## 2015-08-14 NOTE — Patient Instructions (Signed)
OT TREATMENT    Neuro muscular re-education:  Pt. Worked on grasping coins from a tabletop surface, placing them into a resistive container, and pushing them through the slot while isolating his 2nd digit. Pt. Had difficulty grasping them from the tabletop surface. Pt. Worked on a wrist maze while maintaining grip on the handle, with and without a 2# weight.  Therapeutic Exercise: Pt. worked on strengthening his RUE with a 2# dumbbell weight, for elbow flexion, extension, forearm supination, pronation, wrist flexion extension, radial/ulnar deviation. Cues were required for proper position of the RUE. Pt. performed gross gripping with grip strengthener. Pt. Required cues for the placement and position of the gripper. Pt. Worked on pinch strengthening in the right hand for latera, 3pt., and 2pt. pinch using yellow, red, green, blue, and black resistive clips.Tactlie and verbal cues were required for eliciting the desired movement, and wrist extension. Pt. Worked on grasping coins from a tabletop surface, placing them into a resistive container, and pushing them through the slot while isolating his 2nd digit. Pt. Had difficulty grasping them from the tabletop surface. Pt. Worked on a wrist maze while maintaining grip an the handle. Pt.

## 2015-08-15 ENCOUNTER — Encounter: Payer: Self-pay | Admitting: Occupational Therapy

## 2015-08-16 ENCOUNTER — Encounter: Payer: Self-pay | Admitting: Physical Therapy

## 2015-08-16 ENCOUNTER — Encounter: Payer: PPO | Admitting: Occupational Therapy

## 2015-08-16 ENCOUNTER — Ambulatory Visit: Payer: PPO | Admitting: Physical Therapy

## 2015-08-16 DIAGNOSIS — M6281 Muscle weakness (generalized): Secondary | ICD-10-CM | POA: Diagnosis not present

## 2015-08-16 DIAGNOSIS — R262 Difficulty in walking, not elsewhere classified: Secondary | ICD-10-CM

## 2015-08-16 NOTE — Therapy (Signed)
[place] MAIN [facility] SERVICES [address] [place], [place], [address] Phone: [phone]   Fax:  [phone]  Physical Therapy Treatment  Patient Details  Name: Jeremy Yara Sr. MRN: [id number] Date of Birth: 06-09-1950 Referring Provider: Dr. Kerrin Mo  Encounter Date: 08/16/2015      PT End of Session - 08/16/15 1616    Visit Number 2   PT Start Time 0400   PT Stop Time 0445   PT Time Calculation (min) 45 min   Behavior During Therapy [facility] for tasks assessed/performed      Past Medical History  Diagnosis Date  . Arthritis pain   . Polio     age 65    Past Surgical History  Procedure Laterality Date  . Colonoscopy  2013    Dr Vira Agar  . Hernia repair Right 03/28/14    inguinal hernia repair    There were no vitals filed for this visit.      Subjective Assessment - 08/16/15 1616    Subjective Pateint  is a little unsteady with his walking and his RLE gets tired.    Pertinent History cva 2 months ago   Currently in Pain? No/denies    Therapeutic exercise:  TM walking 1.4 with incline 1 for 5 minutes with LLE getting fatigued after 2 minutes TM walking at . 5 miles / hour for 1 minute and 2 minutes with fatigue in LLE  Leg press 100 lbs x 20 x 2 Leg press 75 lbs heel raises x 20 x 2  Standing on blue foam with tapping alternating LE one step x 20, then alternating one higher step BLE on foam x 13 Star stepping left and right 3 star and 4 star   Tilt board side to side and fwd/bwd  Patient has incoordination of stepping with RLE placement 4 square left and right/ diagonals/ fwd and bwd Min cueing needed to appropriately perform  tasks with leg, hand, and head position. Decreased coordination demonstrated requiring consistent verbal cueing to correct form.  Patient continues to demonstrate some in coordination of movement with select exercises. Patient responds well to verbal and tactile cues to correct form and  technique.  CGA to SBA for safety with activities.                               PT Education - 08/16/15 1616    Education provided Yes   Education Details plan of care and HEP   Person(s) Educated Patient   Methods Explanation   Comprehension Verbalized understanding             PT Long Term Goals - 08/14/15 1636    PT LONG TERM GOAL #1   Title Patient will be independent in home exercise program to improve strength/mobility for better functional independence with ADLs   Time 8   Period Weeks   Status New   PT LONG TERM GOAL #2   Title Patient (> 55 years old) will complete five times sit to stand test in < 15 seconds indicating an increased LE strength and improved balance.   Time 8   Period Weeks   Status New   PT LONG TERM GOAL #3   Title . Patient will increase 10 meter walk test to >1.63m/s as to improve gait speed for better community ambulation and to reduce fall risk   Time 8   Period Weeks  Status New               Plan - 08/16/15 1617    Clinical Impression Statement Pateint is able to perform standing exercsies for strengthening and machine exercises to improve strength in LLE hip and ankle.    Rehab Potential Good   Clinical Impairments Affecting Rehab Potential  impaired standing balance, weakess RLE hip add/extension and PF B ankles   PT Frequency 2x / week   PT Duration 8 weeks   PT Treatment/Interventions Therapeutic activities;Therapeutic exercise;Balance training;Functional mobility training;Gait training;Stair training   PT Next Visit Plan strengthening and balance training      Patient will benefit from skilled therapeutic intervention in order to improve the following deficits and impairments:  Abnormal gait, Decreased mobility, Decreased activity tolerance, Decreased endurance, Decreased strength, Decreased balance, Difficulty walking  Visit Diagnosis: Muscle weakness (generalized)  Difficulty in walking, not  elsewhere classified     Problem List There are no active problems to display for this patient. Alanson Puls, PT, [name]  [place], [place] S 08/16/2015, 4:20 PM  [place] MAIN [facility] SERVICES [address] [place], [place], [address] Phone: [phone]   Fax:  [phone]  Name: Jeremy Jove Sr. MRN: [id number] Date of Birth: [birth year]

## 2015-08-17 ENCOUNTER — Encounter: Payer: Self-pay | Admitting: Occupational Therapy

## 2015-08-22 ENCOUNTER — Ambulatory Visit: Payer: PPO | Admitting: Physical Therapy

## 2015-08-22 ENCOUNTER — Ambulatory Visit: Payer: PPO | Admitting: Occupational Therapy

## 2015-08-22 ENCOUNTER — Encounter: Payer: Self-pay | Admitting: Physical Therapy

## 2015-08-22 DIAGNOSIS — R262 Difficulty in walking, not elsewhere classified: Secondary | ICD-10-CM

## 2015-08-22 DIAGNOSIS — M6281 Muscle weakness (generalized): Secondary | ICD-10-CM

## 2015-08-22 DIAGNOSIS — R278 Other lack of coordination: Secondary | ICD-10-CM

## 2015-08-22 NOTE — Patient Instructions (Signed)
SIT TO STAND: No Device   Sit with feet shoulder-width apart, on floor.(Make sure that you are in a chair that won't move like a chair against a wall or couch etc) Lean chest forward, raise hips up from surface. Straighten hips and knees. Weight bear equally on left and right sides. 10___ reps per set, _2__ sets per day, _5__ days per week Place left leg closer to sitting surface.  Copyright  VHI. All rights reserved.  Backward Walking   Walk backward, toes of each foot coming down first. Take long, even strides. Make sure you have a clear pathway with no obstructions when you do this. Stand beside counter and walk backward  And then walk forward doing opposite directions; repeat 10 laps 2x a day at least 5 days a week.  Balance, Proprioception: Hip Abduction With Tubing   With tubing attached to both ankles, Standing holding onto counter, kick one leg out to side and then Return.  Repeat _10___ times  On each side.  Do ___2_ sessions per day.  http://cc.exer.us/20    Copyright  VHI. All rights reserved.  Balance, Proprioception: Hip Extension With Tubing   With tubing tied around both legs, holding onto kitchen counter, swing leg back. Return. Repeat _10___ times . Do __2__ sessions per day.  http://cc.exer.us/19    Copyright  VHI. All rights reserved.  Band Walk: Side Stepping   Tie band around legs,around ankles. Step _10__ feet to one side, then step back to start. Repeat _2-3__ feet per session. Note: Small towel between band and skin eases rubbing.  http://plyo.exer.us/76    Toe / Heel Raise (Standing)    Standing with support, raise heels, then rock back on heels and raise toes. Repeat _10-15___ times.  Copyright  VHI. All rights reserved.  PRE GAIT: Marching    March in place by lifting left leg, then right. Alternate. __Repeat for 2 minutes, _1-2__ sets per day, _5__ days per week Hold onto a support like a chair or the kitchen sink.  Copyright   VHI. All rights reserved.

## 2015-08-22 NOTE — Therapy (Signed)
[place] MAIN [facility] SERVICES [address] [place], [place], [address] Phone: [phone]   Fax:  [phone]  Physical Therapy Treatment  Patient Details  Name: Jeremy Mckinstry Sr. MRN: [id number] Date of Birth: December 17, 1950 Referring Provider: Dr. Kerrin Mo  Encounter Date: 08/22/2015      PT End of Session - 08/22/15 1030    Visit Number 3   Number of Visits 17   Date for PT Re-Evaluation 10/02/15   PT Start Time 1025   PT Stop Time 1108   PT Time Calculation (min) 43 min   Equipment Utilized During Treatment Gait belt   Activity Tolerance Patient tolerated treatment well   Behavior During Therapy [facility] for tasks assessed/performed      Past Medical History  Diagnosis Date  . Arthritis pain   . Polio     age 8    Past Surgical History  Procedure Laterality Date  . Colonoscopy  2013    Dr Vira Agar  . Hernia repair Right 03/28/14    inguinal hernia repair    There were no vitals filed for this visit.      Subjective Assessment - 08/22/15 1029    Subjective Patient reports no new changes. He reports not doing exercises at home due to fatigue. He denies any pain today;    Pertinent History cva 2 months ago   Currently in Pain? No/denies      TREATMENT: Warm up on Nustep BUE/BLE level 3 x5 min with HEP instructions;  Sit<>Stand without HHA x10 Forward/backward walking 10 feet x3 laps unsupported with min Vcs to increase step length for better foot clearance;  Standing with green tband: Hip abduction x10 bilaterally Hip extension x10 bilaterally Side stepping 10 feet x3 laps each direction Patient required min VCs to increase step length with side stepping and to avoid tilting with band exercise for better hip strengthening;  Standing: Heel/toe raises x15 Alternate march x2 min unsupported with  Min VCs to increase hip flexion for better strengthening and flexibility;  Advanced HEP- see patient instructions;    Resisted weighted gait 12.5# forward/backward x2 laps, cross step side stepping x2 laps each direction; Patient required min A for balance and mod VCs to slow down eccentric return for better balance control; Forward step ups x10 each LE without rail assist with cues to increase hip extension with RLE when stepping back for better foot clearance; Standing on upside down 1/2 bolster with rail assist:  Heel/toe raises x10 BUE wand flexion x10 with cues to keep feet in neutral and improve balance control; BUE balloon taps x2 min with cues to improve erect posture and balance with standing on unsupported surface.                           PT Education - 08/22/15 1155    Education provided Yes   Education Details HEP advanced   Person(s) Educated Patient   Methods Explanation;Verbal cues;Handout   Comprehension Verbalized understanding;Returned demonstration;Verbal cues required             PT Long Term Goals - 08/14/15 1636    PT LONG TERM GOAL #1   Title Patient will be independent in home exercise program to improve strength/mobility for better functional independence with ADLs   Time 8   Period Weeks   Status New   PT LONG TERM GOAL #2   Title Patient (> 77 years old) will complete  five times sit to stand test in < 15 seconds indicating an increased LE strength and improved balance.   Time 8   Period Weeks   Status New   PT LONG TERM GOAL #3   Title . Patient will increase 10 meter walk test to >1.46m/s as to improve gait speed for better community ambulation and to reduce fall risk   Time 8   Period Weeks   Status New               Plan - 08/22/15 1237    Clinical Impression Statement Instructed patient in advanced LE strengthening and balance exercise for HEP; Provided written handout for increase compliance. Patient required min VCs for correct exercise technique. He does exhibit impaired coordination with dynamic tasks. Patient would  benefit from additional skilled PT Intervention to improve strength, balance and gait safety;    Rehab Potential Good   Clinical Impairments Affecting Rehab Potential  impaired standing balance, weakess RLE hip add/extension and PF B ankles   PT Frequency 2x / week   PT Duration 8 weeks   PT Treatment/Interventions Therapeutic activities;Therapeutic exercise;Balance training;Functional mobility training;Gait training;Stair training   PT Next Visit Plan strengthening and balance training   PT Home Exercise Plan initiated- see patient instructions;    Consulted and Agree with Plan of Care Patient      Patient will benefit from skilled therapeutic intervention in order to improve the following deficits and impairments:  Abnormal gait, Decreased mobility, Decreased activity tolerance, Decreased endurance, Decreased strength, Decreased balance, Difficulty walking  Visit Diagnosis: Muscle weakness (generalized)  Difficulty in walking, not elsewhere classified     Problem List There are no active problems to display for this patient.   Baley Lorimer PT, [name] 08/22/2015, 12:39 PM  [place] MAIN [facility] SERVICES [address] [place], [place], [address] Phone: [phone]   Fax:  [phone]  Name: Jeremy Hartman Sr. MRN: [id number] Date of Birth: [birth year]

## 2015-08-23 ENCOUNTER — Encounter: Payer: Self-pay | Admitting: Occupational Therapy

## 2015-08-23 NOTE — Therapy (Signed)
[place] MAIN [facility] SERVICES [address] [place], [place], [address] Phone: [phone]   Fax:  [phone]  Occupational Therapy Treatment  Patient Details  Name: Jeremy Gurian Sr. MRN: [id number] Date of Birth: 03/14/1951 Referring Provider: Ramonita Lab  Encounter Date: 08/22/2015      OT End of Session - 08/23/15 1454    Visit Number 3   Number of Visits 16   Date for OT Re-Evaluation 10/03/15   Authorization Type Medicare G codes 3   OT Start Time Jul 12, 1115   OT Stop Time 1205   OT Time Calculation (min) 48 min   Activity Tolerance Patient tolerated treatment well   Behavior During Therapy [facility] for tasks assessed/performed      Past Medical History  Diagnosis Date  . Arthritis pain   . Polio     age 65    Past Surgical History  Procedure Laterality Date  . Colonoscopy  07-12-2011    Dr Vira Agar  . Hernia repair Right 03/28/14    inguinal hernia repair    There were no vitals filed for this visit.      Subjective Assessment - 08/23/15 1452    Subjective  Patient reports he is doing well, wife is asking for exercises to patient to perform at home.  "He thinks I am trying to be mean but I know he needs to be doing more everyday."  Patient reports he is frustrated with his speech and is ready to get started this week with SLP.   Patient is accompained by: Family member   Patient Stated Goals Use my right arm and leg again, write again and talk better.   Currently in Pain? No/denies   Multiple Pain Sites No                      OT Treatments/Exercises (OP) - 08/23/15 1509    ADLs   Writing Patient seen for handwriting tasks this date with cues for stroke formation and size of letters in printed form for increased legibility.   Fine Motor Coordination   Other Fine Motor Exercises Patient seen for right hand coordination skills with [place] discs for 3 sets, 1st set with right hand only flipping and turning  discs and attempting to increase speed, 2nd set with both right and left hand simultaneously and then last set with right hand only in combination with active reaching in multiple directions.  Patient seen for manipulation of grooved pegs into board with cues for isolated finger movements.  Coin manipulation from tabletop with cues for prehension patterns and not to slide objects to edge of table.  Patient instructed on using the hand for storage and translatory skills of the right hand.  Patient seen for manipulation of cards for shuffling , dealing and flipping cards with focus on thumb and finger combinations.  Issued and instructed with written handout for HEP for coordination and strengthening tasks.    Neurological Re-education Exercises   Other Exercises 1 Patient seen for UB strengthening tasks with right hand for sustained grip acts with 17# for 25 reps and then 23# for 10 reps.  UBE for 6 minutes with resistance of 2.0 to 2.5, forwards/backwards alternating directions and therapist in constant attendance to ensure grip and adjust settings.                 OT Education - 08/22/15 1454    Education provided Yes   Education Details HEP  for coordination and strength   Person(s) Educated Patient;Spouse   Methods Explanation;Demonstration;Tactile cues;Handout;Verbal cues   Comprehension Verbal cues required;Returned demonstration;Verbalized understanding;Tactile cues required;Need further instruction             OT Long Term Goals - 08/11/15 1107    OT LONG TERM GOAL #1   Title Patient will improve RUE strength by 1 mm grade to be able to lift items up to 8# without difficulty.     Baseline difficulty with using right hand to lift items such as milk jug.    Time 8   Period Weeks   Status New   OT LONG TERM GOAL #2   Title Patient will increase right hand grip strength by 10# to be able to open jars and containers with modified independence.   Baseline unable   Time 8    Period Weeks   Status New   OT LONG TERM GOAL #3   Title Patient will improve right hand coordination to write his name and address on important papers with 90% legibility.   Baseline legibility less than 25%.   Time 8   Period Weeks   Status New   OT LONG TERM GOAL #4   Title Patient will complete lower body dressing with modified independence including donning socks.   Baseline difficulty with donning socks and needs occasional assist.   Time 8   Period Weeks   Status New   OT LONG TERM GOAL #5   Title Patient will perform house hold chores and yard work with supervision.   Baseline unable    Time 8   Period Weeks   Status New   Long Term Additional Goals   Additional Long Term Goals Yes   OT LONG TERM GOAL #6   Title Patient will improve fine motor coordination to pick up and manipulate coins to make change independently.   Baseline difficulty with manipulation of small objects.   Time 8   Period Weeks   Status New               Plan - 08/23/15 1455    Clinical Impression Statement Patient has made good progress with right hand strength and coordination over the last week, dropping items less, able to pick up coins with improved form and accuracy.  Handwriting remains illegible even with printed versions.  He is able to demo increased speed and dexterity of the right hand but still drops items occasionally but less often.  Continue to work towards improving right hand and arm use for necessary daily tasks.    Rehab Potential Good   OT Frequency 2x / week   OT Duration 8 weeks   OT Treatment/Interventions Self-care/ADL training;Therapeutic exercise;Patient/family education;Neuromuscular education;Manual Therapy;Balance training;Therapeutic exercises;DME and/or AE instruction;Therapeutic activities;Cognitive remediation/compensation   Consulted and Agree with Plan of Care Patient      Patient will benefit from skilled therapeutic intervention in order to improve the  following deficits and impairments:  Decreased activity tolerance, Decreased knowledge of use of DME, Decreased strength, Impaired flexibility, Decreased balance, Decreased cognition, Decreased range of motion, Decreased coordination, Impaired UE functional use, Difficulty walking, Decreased mobility  Visit Diagnosis: Muscle weakness (generalized)  Other lack of coordination    Problem List There are no active problems to display for this patient.  Achilles Dunk, OTR/L, CLT  Lovett,Amy 08/23/2015, 3:19 PM  [place] MAIN [facility] SERVICES [address] [place], [place], [address] Phone: [phone]   Fax:  [phone]  Name: Jeremy Masterman Sr. MRN: [id number] Date of Birth: [birth year]

## 2015-08-24 ENCOUNTER — Encounter: Payer: Self-pay | Admitting: Physical Therapy

## 2015-08-24 ENCOUNTER — Ambulatory Visit: Payer: PPO | Admitting: Speech Pathology

## 2015-08-24 ENCOUNTER — Ambulatory Visit: Payer: PPO | Admitting: Physical Therapy

## 2015-08-24 DIAGNOSIS — R262 Difficulty in walking, not elsewhere classified: Secondary | ICD-10-CM

## 2015-08-24 DIAGNOSIS — M6281 Muscle weakness (generalized): Secondary | ICD-10-CM | POA: Diagnosis not present

## 2015-08-24 DIAGNOSIS — R278 Other lack of coordination: Secondary | ICD-10-CM

## 2015-08-24 DIAGNOSIS — R4701 Aphasia: Secondary | ICD-10-CM

## 2015-08-24 NOTE — Therapy (Signed)
[place] MAIN [facility] SERVICES [address] [place], [place], [address] Phone: [phone]   Fax:  [phone]  Physical Therapy Treatment  Patient Details  Name: Jeremy Nethercott Sr. MRN: [id number] Date of Birth: 1950/05/04 Referring Provider: Dr. Kerrin Mo  Encounter Date: 08/24/2015      PT End of Session - 08/24/15 1642    Visit Number 4   Number of Visits 17   Date for PT Re-Evaluation 10/02/15   PT Start Time 1602   PT Stop Time 1645   PT Time Calculation (min) 43 min   Equipment Utilized During Treatment Gait belt   Activity Tolerance Patient tolerated treatment well   Behavior During Therapy [facility] for tasks assessed/performed      Past Medical History  Diagnosis Date  . Arthritis pain   . Polio     age 39    Past Surgical History  Procedure Laterality Date  . Colonoscopy  2013    Dr Vira Agar  . Hernia repair Right 03/28/14    inguinal hernia repair    There were no vitals filed for this visit.      Subjective Assessment - 08/24/15 1613    Subjective Patient denies any new changes; He reports a little soreness after last session; He reports doing HEP without difficulty;    Pertinent History cva 2 months ago   Currently in Pain? No/denies         TREATMENT: Warm up on Nustep BUE/BLE level 3 x5 min with HEP instructions;  Sit<>Stand with yellow weighted ball overhead press x10 with min VCs to increase forward lean for better transfer ability;   Tandem stance on airex beam 10 sec hold x2 each foot in front unsupported; Tandem stance on airex beam with BUE ball pass x3 passes, x2 each foot in front; Side stepping on airex beam without rail assist x3 laps; Feet apart on airex beam: balloon taps x3-4 min unsupported with min VCs to improve trunk control for better balance support;  Patient required min VCs for balance stability, including to increase trunk control for less loss of balance with smaller base of  support  Ladder drills: Forward reciprocal x4 laps with cues to increase erect posture; Out-out-in-in x4 laps with cues to increase speed of movement and pick feet up for less drag; Forward step with opposite LE SLS 3 sec hold x1 lap, min A for balance;  Forward lunges in multiple directions working on stepping outside base of support x3 min each direction with CGA for balance and mod VCs to increase push back through LE for better dynamic control;  Quantum: Leg press BLE 105# x10 Leg press heel raises BLE 105# x10 Leg press RLE only 60# 2x10 Patient required min VCs to slow down LE movement and to improve positioning for better strengthening; Patient reports minimal fatigue after treatment session;                                PT Education - 08/24/15 1642    Education provided Yes   Education Details balance exercise, strengthening;    Person(s) Educated Patient   Methods Explanation;Verbal cues   Comprehension Verbalized understanding;Returned demonstration;Verbal cues required             PT Long Term Goals - 08/14/15 1636    PT LONG TERM GOAL #1   Title Patient will be independent in home exercise program to  improve strength/mobility for better functional independence with ADLs   Time 8   Period Weeks   Status New   PT LONG TERM GOAL #2   Title Patient (> 2 years old) will complete five times sit to stand test in < 15 seconds indicating an increased LE strength and improved balance.   Time 8   Period Weeks   Status New   PT LONG TERM GOAL #3   Title . Patient will increase 10 meter walk test to >1.61m/s as to improve gait speed for better community ambulation and to reduce fall risk   Time 8   Period Weeks   Status New               Plan - 08/24/15 1642    Clinical Impression Statement Patient demonstrates improved balance requiring less rail assist with advanced exercise. He was able to stand on uneven surface with better  trunk control. Patient did have trouble with dynamic balance and coordination with ladder drills and stepping outside base of support. He would benefit from additional skilled PT Intervention to improve balance/gait safety and reduce fall risk;    Rehab Potential Good   Clinical Impairments Affecting Rehab Potential  impaired standing balance, weakess RLE hip add/extension and PF B ankles   PT Frequency 2x / week   PT Duration 8 weeks   PT Treatment/Interventions Therapeutic activities;Therapeutic exercise;Balance training;Functional mobility training;Gait training;Stair training   PT Next Visit Plan strengthening and balance training   PT Home Exercise Plan continue as given;    Consulted and Agree with Plan of Care Patient      Patient will benefit from skilled therapeutic intervention in order to improve the following deficits and impairments:  Abnormal gait, Decreased mobility, Decreased activity tolerance, Decreased endurance, Decreased strength, Decreased balance, Difficulty walking  Visit Diagnosis: Muscle weakness (generalized)  Other lack of coordination  Difficulty in walking, not elsewhere classified     Problem List There are no active problems to display for this patient.   Capitola Ladson PT, [name] 08/24/2015, 4:43 PM  [place] MAIN [facility] SERVICES [address] [place], [place], [address] Phone: [phone]   Fax:  [phone]  Name: Jeremy Stfort Sr. MRN: [id number] Date of Birth: [birth year]

## 2015-08-25 ENCOUNTER — Encounter: Payer: Self-pay | Admitting: Speech Pathology

## 2015-08-25 NOTE — Therapy (Signed)
[place] MAIN [facility] SERVICES [address] [place], [place], [address] Phone: [phone]   Fax:  [phone]  Speech Language Pathology Evaluation  Patient Details  Name: Jeremy Barraclough Sr. MRN: [id number] Date of Birth: April 13, 1950 Referring Provider: Dr. Caryl Comes  Encounter Date: 08/24/2015      End of Session - 08/25/15 1333    Visit Number 1   Number of Visits 17   Date for SLP Re-Evaluation 10/27/15   SLP Start Time 1500   SLP Stop Time  1600   SLP Time Calculation (min) 60 min   Activity Tolerance Patient tolerated treatment well      Past Medical History  Diagnosis Date  . Arthritis pain   . Polio     age 65    Past Surgical History  Procedure Laterality Date  . Colonoscopy  2013    Dr Vira Agar  . Hernia repair Right 03/28/14    inguinal hernia repair    There were no vitals filed for this visit.      Subjective Assessment - 08/25/15 1337    Subjective 65 year old man S/P left ICA ischemic stroke 06/11/2015 with resultant aphasia and dysphagia.  The patient has received SLP services with inpatient rehab and via home health.  The patient states that dysphagia has resolved and he is eating a regular diet.            SLP Evaluation [facility] - 08/25/15 0001    SLP Visit Information   SLP Received On 08/24/15   Referring Provider Dr. Caryl Comes   Onset Date 06/11/2015   Medical Diagnosis Left ICA ischemic stroke   Subjective   Subjective "I can't talk"   Pain Assessment   Currently in Pain? No/denies   Prior Functional Status   Cognitive/Linguistic Baseline Within functional limits   Cognition   Overall Cognitive Status Within Functional Limits for tasks assessed   Auditory Comprehension   Overall Auditory Comprehension Appears within functional limits for tasks assessed   Verbal Expression   Overall Verbal Expression Impaired   Oral Motor/Sensory Function   Overall Oral Motor/Sensory Function Appears within  functional limits for tasks assessed   Motor Speech   Overall Motor Speech Appears within functional limits for tasks assessed   Standardized Assessments   Standardized Assessments  Western Aphasia Battery revised      Western Aphasia Battery- Revised  Spontaneous Speech      Information content  8/10       Fluency   5/10     Comprehension     Yes/No questions  54/60        Auditory Word Recognition 60/60        Sequential Commands 80/80    Repetition   97/100     Naming    Object Naming  57/60        Word Fluency   12/20        Sentence Completion 10/10        Responsive Speech 10/10     Aphasia Quotient  81.2/100   Reading and Writing    Reading   77/100        Writing   63.5/100   Language Quotient  78.4/100         SLP Education - 08/25/15 1332    Education provided Yes   Education Details Goals for speech therapy   Person(s) Educated Patient;Spouse   Methods Explanation   Comprehension Verbalized understanding  SLP Long Term Goals - 08/25/15 1337    SLP LONG TERM GOAL #1   Title Patient will generate grammatical and cogent sentence to complete simple/concrete linguistic task with 80% accuracy.   Time 8   Period Weeks   Status New   SLP LONG TERM GOAL #2   Title Patient will generate grammatical and cogent sentence to complete abstract/complex linguistic task with 80% accuracy.   Time 8   Period Weeks   Status New   SLP LONG TERM GOAL #3   Title Patient will complete high level word finding tasks with 80% accuracy.   Time 8   Period Weeks   Status New          Plan - 08/25/15 1334    Clinical Impression Statement At 10 weeks post onset of CVA, the patient is presenting with mild aphasia characterized by reduced information content, fluency, grammatical competence, and paraphasias of spontaneous speech.  He will benefit from continued speech therapy for restorative and compensatory treatment of aphasia.   Speech Therapy Frequency 2x /  week   Duration Other (comment)  8 weeks   Treatment/Interventions Language facilitation;Patient/family education;SLP instruction and feedback;Multimodal communcation approach   Potential to Achieve Goals Good   Potential Considerations Ability to learn/carryover information;Co-morbidities;Cooperation/participation level;Medical prognosis;Previous level of function;Severity of impairments;Family/community support   SLP Home Exercise Plan To be determined   Consulted and Agree with Plan of Care Patient;Family member/caregiver   Family Member Consulted Spouse      Patient will benefit from skilled therapeutic intervention in order to improve the following deficits and impairments:   Aphasia - Plan: SLP plan of care cert/re-cert      G-Codes - [id number]    Functional Assessment Tool Used Western Aphasia Battery- Revised, clinical judgment   Functional Limitations Spoken language expressive   Spoken Language Expression Current Status [phone]) At least 20 percent but less than 40 percent impaired, limited or restricted   Spoken Language Expression Goal Status [id number]) At least 1 percent but less than 20 percent impaired, limited or restricted      Problem List There are no active problems to display for this patient.  [name], MS/CCC- SLP  [name] 08/25/2015, 1:42 PM  [place] MAIN [facility] SERVICES [address] [place], [place], [address] Phone: [phone]   Fax:  [phone]  Name: Jeremy Shingleton Sr. MRN: [id number] Date of Birth: July 07, 1950

## 2015-08-28 ENCOUNTER — Ambulatory Visit: Payer: PPO | Admitting: Physical Therapy

## 2015-08-28 ENCOUNTER — Encounter: Payer: Self-pay | Admitting: Physical Therapy

## 2015-08-28 ENCOUNTER — Ambulatory Visit: Payer: PPO | Admitting: Occupational Therapy

## 2015-08-28 DIAGNOSIS — M6281 Muscle weakness (generalized): Secondary | ICD-10-CM | POA: Diagnosis not present

## 2015-08-28 DIAGNOSIS — R278 Other lack of coordination: Secondary | ICD-10-CM

## 2015-08-28 DIAGNOSIS — R262 Difficulty in walking, not elsewhere classified: Secondary | ICD-10-CM
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
## 2015-08-28 NOTE — Therapy (Signed)
[place] MAIN [facility] SERVICES [address] [place], [place], [address] Phone: [phone]   Fax:  [phone]  Physical Therapy Treatment  Patient Details  Name: Jeremy Castiglioni Sr. MRN: [id number] Date of Birth: 07/26/1950 Referring Provider: Dr. Kerrin Mo  Encounter Date: 08/28/2015      PT End of Session - 08/28/15 1610    Visit Number 5   Number of Visits 17   Date for PT Re-Evaluation 10/02/15   PT Start Time 1605   PT Stop Time 1645   PT Time Calculation (min) 40 min   Equipment Utilized During Treatment Gait belt   Activity Tolerance Patient tolerated treatment well   Behavior During Therapy [facility] for tasks assessed/performed      Past Medical History  Diagnosis Date  . Arthritis pain   . Polio     age 65    Past Surgical History  Procedure Laterality Date  . Colonoscopy  2013    Dr Vira Agar  . Hernia repair Right 03/28/14    inguinal hernia repair    There were no vitals filed for this visit.      Subjective Assessment - 08/28/15 1609    Subjective Patient denies any new changes;  He reports doing HEP without difficulty;    Pertinent History cva 2 months ago   Currently in Pain? No/denies      Neuromuscular education:  Blue balance beam side stepping left and right with head turns left and right Blue balance beam with side stepping slow pace Trampoline jumping and marching  Marching on blue foam , tandem standing on blue with head turns Leg press 100 lbs x 15 x 2 Matrix side stepping/fwd/bwd walking Patient needs occasional verbal cueing to improve posture and cueing to correctly perform exercises slowly, holding at end of range to increase motor firing of desired muscle to encourage fatigue.                            PT Education - 08/28/15 1610    Education provided Yes   Education Details Progression of HEP   Person(s) Educated Patient   Methods Explanation   Comprehension  Verbalized understanding             PT Long Term Goals - 08/14/15 1636    PT LONG TERM GOAL #1   Title Patient will be independent in home exercise program to improve strength/mobility for better functional independence with ADLs   Time 8   Period Weeks   Status New   PT LONG TERM GOAL #2   Title Patient (> 53 years old) will complete five times sit to stand test in < 15 seconds indicating an increased LE strength and improved balance.   Time 8   Period Weeks   Status New   PT LONG TERM GOAL #3   Title . Patient will increase 10 meter walk test to >1.42m/s as to improve gait speed for better community ambulation and to reduce fall risk   Time 8   Period Weeks   Status New               Plan - 08/28/15 1611    Clinical Impression Statement Patient is able to perform his exercises with modeling and verbal correction. He has fatigue with dynamic standing balance exercises.    Rehab Potential Good   Clinical Impairments Affecting Rehab Potential  impaired standing balance, weakess RLE  hip add/extension and PF B ankles   PT Frequency 2x / week   PT Duration 8 weeks   PT Treatment/Interventions Therapeutic activities;Therapeutic exercise;Balance training;Functional mobility training;Gait training;Stair training   PT Next Visit Plan strengthening and balance training   PT Home Exercise Plan continue as given;    Consulted and Agree with Plan of Care Patient      Patient will benefit from skilled therapeutic intervention in order to improve the following deficits and impairments:  Abnormal gait, Decreased mobility, Decreased activity tolerance, Decreased endurance, Decreased strength, Decreased balance, Difficulty walking  Visit Diagnosis: Difficulty in walking, not elsewhere classified     Problem List There are no active problems to display for this patient.  Alanson Puls, PT, [name] [place], [place] S 08/28/2015, 4:15 PM  [place] MAIN [facility] SERVICES [address] [place], [place], [address] Phone: [phone]   Fax:  [phone]  Name: Jeremy Delarocha Sr. MRN: [id number] Date of Birth: 03-03-1951

## 2015-08-30 ENCOUNTER — Ambulatory Visit: Payer: PPO | Admitting: Speech Pathology

## 2015-08-30 DIAGNOSIS — R4701 Aphasia: Secondary | ICD-10-CM

## 2015-08-30 DIAGNOSIS — M6281 Muscle weakness (generalized): Secondary | ICD-10-CM | POA: Diagnosis not present

## 2015-08-31 ENCOUNTER — Ambulatory Visit: Payer: Self-pay | Admitting: Physical Therapy

## 2015-08-31 ENCOUNTER — Encounter: Payer: Self-pay | Admitting: Speech Pathology

## 2015-08-31 DIAGNOSIS — Z4509 Encounter for adjustment and management of other cardiac device: Secondary | ICD-10-CM | POA: Diagnosis not present

## 2015-08-31 NOTE — Therapy (Signed)
[place] MAIN [facility] SERVICES [address] [place], [place], [address] Phone: [phone]   Fax:  [phone]  Speech Language Pathology Treatment  Patient Details  Name: Jeremy Zellar Sr. MRN: [id number] Date of Birth: 1950/12/09 Referring Provider: Dr. Caryl Comes  Encounter Date: 08/30/2015      End of Session - 08/31/15 1734    Visit Number 2   Number of Visits 17   Date for SLP Re-Evaluation 10/27/15   SLP Start Time 1500   SLP Stop Time  1600   SLP Time Calculation (min) 60 min   Activity Tolerance Patient tolerated treatment well      Past Medical History  Diagnosis Date  . Arthritis pain   . Polio     age 65    Past Surgical History  Procedure Laterality Date  . Colonoscopy  2013    Dr Vira Agar  . Hernia repair Right 03/28/14    inguinal hernia repair    There were no vitals filed for this visit.      Subjective Assessment - 08/31/15 1733    Subjective The patient's wife is concerned that the patient's language is worse.  The patient feels that his language simple goes up and down.   Currently in Pain? No/denies               ADULT SLP TREATMENT - 08/31/15 0001    General Information   Behavior/Cognition Alert;Cooperative;Pleasant mood   HPI CVA 06/11/2015   Treatment Provided   Treatment provided Cognitive-Linquistic   Pain Assessment   Pain Assessment No/denies pain   Cognitive-Linquistic Treatment   Treatment focused on Aphasia   Skilled Treatment CONNECTED SPEECH: The patient demonstrates reduced information content, fluency, grammatical competence, and paraphasias of spontaneous speech that is worse than we saw at the eval last week.   HIGH LEVEL WORD FINDING:  Name (concrete) category given 3 members with 68% accuracy.  Deduce item given 3 clue words with 68% accuracy.   Assessment / Recommendations / Plan   Plan Continue with current plan of care   Progression Toward Goals   Progression toward  goals Progressing toward goals          SLP Education - 08/31/15 1734    Education provided Yes   Education Details Consider contacting MD if language continue to change   Person(s) Educated Patient   Methods Explanation   Comprehension Verbalized understanding            SLP Long Term Goals - 08/25/15 1337    SLP LONG TERM GOAL #1   Title Patient will generate grammatical and cogent sentence to complete simple/concrete linguistic task with 80% accuracy.   Time 8   Period Weeks   Status New   SLP LONG TERM GOAL #2   Title Patient will generate grammatical and cogent sentence to complete abstract/complex linguistic task with 80% accuracy.   Time 8   Period Weeks   Status New   SLP LONG TERM GOAL #3   Title Patient will complete high level word finding tasks with 80% accuracy.   Time 8   Period Weeks   Status New          Plan - 08/31/15 1735    Clinical Impression Statement The patient demonstrates reduced information content, fluency, grammatical competence, and paraphasias of spontaneous speech that is worse than we saw at the eval last week. The patient's wife is concerned that the patient's language is worse.  The patient feels that his language simple goes up and down.   Speech Therapy Frequency 2x / week   Duration Other (comment)  8 weeks   Treatment/Interventions Language facilitation;Patient/family education;SLP instruction and feedback;Multimodal communcation approach   Potential to Achieve Goals Good   Potential Considerations Ability to learn/carryover information;Co-morbidities;Cooperation/participation level;Medical prognosis;Previous level of function;Severity of impairments;Family/community support   Consulted and Agree with Plan of Care Patient;Family member/caregiver   Family Member Consulted Spouse      Patient will benefit from skilled therapeutic intervention in order to improve the following deficits and impairments:   Aphasia    Problem  List There are no active problems to display for this patient.  [name], [place], [place] 08/31/2015, 5:36 PM  [place] MAIN [facility] SERVICES [address] [place], [place], [address] Phone: [phone]   Fax:  [phone]   Name: Jeremy Diles Sr. MRN: [id number] Date of Birth: June 29, 1950

## 2015-09-05 ENCOUNTER — Ambulatory Visit: Payer: PPO | Admitting: Physical Therapy

## 2015-09-05 ENCOUNTER — Encounter: Payer: Self-pay | Admitting: Physical Therapy

## 2015-09-05 ENCOUNTER — Encounter: Payer: PPO | Admitting: Occupational Therapy

## 2015-09-05 DIAGNOSIS — M6281 Muscle weakness (generalized): Secondary | ICD-10-CM | POA: Diagnosis not present

## 2015-09-05 DIAGNOSIS — R262 Difficulty in walking, not elsewhere classified: Secondary | ICD-10-CM

## 2015-09-05 NOTE — Therapy (Signed)
[place] MAIN [facility] SERVICES [address] [place], [place], [address] Phone: [phone]   Fax:  [phone]  Physical Therapy Treatment  Patient Details  Name: Jeremy Mazzarese Sr. MRN: [id number] Date of Birth: 23-Apr-1950 Referring Provider: Dr. Kerrin Mo  Encounter Date: 09/05/2015      PT End of Session - 09/05/15 1131    Visit Number 6   Number of Visits 17   Date for PT Re-Evaluation 10/02/15   PT Start Time 1130   PT Stop Time 1210   PT Time Calculation (min) 40 min      Past Medical History  Diagnosis Date  . Arthritis pain   . Polio     age 54    Past Surgical History  Procedure Laterality Date  . Colonoscopy  2013    Dr Vira Agar  . Hernia repair Right 03/28/14    inguinal hernia repair    There were no vitals filed for this visit.      Subjective Assessment - 09/05/15 1128    Subjective Patient denies any new changes;  He reports doing HEP without difficulty;    Pertinent History cva 2 months ago   Currently in Pain? No/denies   Pain Onset In the past 7 days          Therapeutic exercise:  Sit to stand 10 x 3 sets with sitting on pillow to increase height   Heel raises 10 x 3 sets Standing hip abduction, flexion and extension in // bars 10 x 2 sets bilaterally Matrix fwd/bwd/ side stepping left and right TM x 5 minutes 1.2 and elevation of 3   Pt required min to mod cues for correct exercise sequencing with verbal and tactile cues and CGA for safety  Neuromuscular re ed:  NBOS feet together on airex with eyes close 15 sec x 5 sets NBOS feet together on airex with lateral and up and down head movement 30 sec x 3 sets Tandem stance on airex 20 sec x 2 sets Walking on 2 x 4 with min assist   Pt required min to mod assistance for safety with min verbal cues for correct form          CGA needed with all activities except walking on 2x4 which needed Min A                   PT Education - 09/05/15 1131    Education provided Yes   Education Details HEP review   Person(s) Educated Patient   Methods Explanation   Comprehension Verbalized understanding             PT Long Term Goals - 08/14/15 1636    PT LONG TERM GOAL #1   Title Patient will be independent in home exercise program to improve strength/mobility for better functional independence with ADLs   Time 8   Period Weeks   Status New   PT LONG TERM GOAL #2   Title Patient (> 15 years old) will complete five times sit to stand test in < 15 seconds indicating an increased LE strength and improved balance.   Time 8   Period Weeks   Status New   PT LONG TERM GOAL #3   Title . Patient will increase 10 meter walk test to >1.72m/s as to improve gait speed for better community ambulation and to reduce fall risk   Time 8   Period Weeks   Status New  Plan - 09/05/15 1132    Clinical Impression Statement Patient performs dyanmic standing balance without pain behaviors.    Rehab Potential Good   Clinical Impairments Affecting Rehab Potential  impaired standing balance, weakess RLE hip add/extension and PF B ankles   PT Frequency 2x / week   PT Duration 8 weeks   PT Treatment/Interventions Therapeutic activities;Therapeutic exercise;Balance training;Functional mobility training;Gait training;Stair training   PT Next Visit Plan strengthening and balance training   PT Home Exercise Plan continue as given;    Consulted and Agree with Plan of Care Patient      Patient will benefit from skilled therapeutic intervention in order to improve the following deficits and impairments:  Abnormal gait, Decreased mobility, Decreased activity tolerance, Decreased endurance, Decreased strength, Decreased balance, Difficulty walking  Visit Diagnosis: Difficulty in walking, not elsewhere classified  Muscle weakness (generalized)     Problem List There are no active problems to display  for this patient.  Alanson Puls, PT, [name] [place], [place] S 09/05/2015, 11:34 AM  [place] MAIN [facility] SERVICES [address] [place], [place], [address] Phone: [phone]   Fax:  [phone]  Name: Jeremy Emard Sr. MRN: [id number] Date of Birth: Feb 17, 1951

## 2015-09-07 ENCOUNTER — Ambulatory Visit: Payer: PPO | Admitting: Physical Therapy

## 2015-09-07 ENCOUNTER — Encounter: Payer: Self-pay | Admitting: Physical Therapy

## 2015-09-07 ENCOUNTER — Encounter: Payer: Self-pay | Admitting: Speech Pathology

## 2015-09-07 ENCOUNTER — Ambulatory Visit: Payer: PPO | Admitting: Occupational Therapy

## 2015-09-07 ENCOUNTER — Encounter: Payer: Self-pay | Admitting: Occupational Therapy

## 2015-09-07 ENCOUNTER — Ambulatory Visit: Payer: PPO | Attending: Internal Medicine | Admitting: Speech Pathology

## 2015-09-07 DIAGNOSIS — M6281 Muscle weakness (generalized): Secondary | ICD-10-CM

## 2015-09-07 DIAGNOSIS — R262 Difficulty in walking, not elsewhere classified: Secondary | ICD-10-CM

## 2015-09-07 DIAGNOSIS — R278 Other lack of coordination: Secondary | ICD-10-CM

## 2015-09-07 DIAGNOSIS — R4701 Aphasia: Secondary | ICD-10-CM | POA: Diagnosis not present

## 2015-09-07 NOTE — Therapy (Signed)
[place] MAIN [facility] SERVICES [address] [place], [place], [address] Phone: [phone]   Fax:  [phone]  Physical Therapy Treatment  Patient Details  Name: Jeremy Winschel Sr. MRN: [id number] Date of Birth: [birth year] Referring Provider: Dr. Kerrin Mo  Encounter Date: 09/07/2015      PT End of Session - 09/07/15 1022    Visit Number 7   Number of Visits 17   Date for PT Re-Evaluation 10/02/15   Authorization Type gcode 7   Authorization Time Period 10   PT Start Time 1030   PT Stop Time 1125   PT Time Calculation (min) 55 min   Equipment Utilized During Treatment Gait belt   Activity Tolerance Patient tolerated treatment well   Behavior During Therapy WFL for tasks assessed/performed      Past Medical History  Diagnosis Date  . Arthritis pain   . Polio     age 65    Past Surgical History  Procedure Laterality Date  . Colonoscopy  2013    Dr Vira Agar  . Hernia repair Right 03/28/14    inguinal hernia repair    There were no vitals filed for this visit.      Subjective Assessment - 09/07/15 1032    Subjective Patient denies any new changes; He reports mixed compliance with HEP, "I do them sometimes." He reports mostly doing the band exercises;    Pertinent History cva 2 months ago   Currently in Pain? No/denies   Pain Onset In the past 7 days        TREATMENT: Warm up on Nustep BUE/BLE level 3 x5 min with HEP instructions;  Sit<>Stand with yellow weighted ball toss x10 with min VCs to increase forward lean for better transfer ability;   Tandem stance on firm surface with BUE ball toss and catch x5 each foot in front; SLS on firm surface with BUE  Ball toss and catch x5 each LE; Forward step airex-BOSU-big box and back down without rail assist x5 each LE with cues for foot placement and to improve trunk control for balance; Standing one foot on airex,, other foot on BOSU:  -BUE ball pass side/side x5 each  foot in front; -BUE wand flexion x10 each foot in front with  Patient required min VCs for balance stability, including to increase trunk control for less loss of balance with smaller base of support  Resisted weighted gait, forward/backward, side/side (4 way) with side step cross over, 12.5# x2 laps each with min-mod A for balance and cues to increase foot clearance on right side for better safety and balance control;  Diagonal steps forward/backward over narrow beam with green tband for resistance x2 laps; Side stepping on narrow beam with green tband, min A for balance x3 laps each direction with cues to increase step length and slow down LE movement for better balance challenge;  Quantum: Leg press BLE 120# 2x10 Leg press heel raises BLE 120# 2x10 Leg press RLE only 60# 2x10 Patient required min VCs to slow down LE movement and to improve positioning for better strengthening;  Gait in hallway: Forward/backward walking x100 feet with cues to increase right LE foot clearance; Forward walking with quick pivot turns x6 reps; Patient required min VCs to slow down prior to turning and to stop after turn prior to starting gait for better balance;  Patient reports minimal fatigue after treatment session;   Reinforced HEP with instruction to increase RLE SLS with standing tband  exercise;                          PT Education - 09/07/15 1033    Education provided Yes   Education Details HEP reinforced, balance, strengtheing;    Person(s) Educated Patient   Methods Explanation;Verbal cues   Comprehension Verbalized understanding;Returned demonstration;Verbal cues required             PT Long Term Goals - 08/14/15 1636    PT LONG TERM GOAL #1   Title Patient will be independent in home exercise program to improve strength/mobility for better functional independence with ADLs   Time 8   Period Weeks   Status New   PT LONG TERM GOAL #2   Title Patient (> 46 years  old) will complete five times sit to stand test in < 15 seconds indicating an increased LE strength and improved balance.   Time 8   Period Weeks   Status New   PT LONG TERM GOAL #3   Title . Patient will increase 10 meter walk test to >1.9m/s as to improve gait speed for better community ambulation and to reduce fall risk   Time 8   Period Weeks   Status New               Plan - 09/07/15 1138    Clinical Impression Statement Patient instructed in advanced balance exercise. He is able to demonstrate good stance control with just tandem stance but had difficulty with reaching outside base of support. Patient does fatigue quickly, especially in RLE; He demonstrates increased right foot drag with gait tasks when fatigued. Patient would benefit from additional skilled PT intervention to improve balance/gait safety and reduce fall risk;    Rehab Potential Good   Clinical Impairments Affecting Rehab Potential  impaired standing balance, weakess RLE hip add/extension and PF B ankles   PT Frequency 2x / week   PT Duration 8 weeks   PT Treatment/Interventions Therapeutic activities;Therapeutic exercise;Balance training;Functional mobility training;Gait training;Stair training   PT Next Visit Plan strengthening and balance training   PT Home Exercise Plan continue as given;    Consulted and Agree with Plan of Care Patient      Patient will benefit from skilled therapeutic intervention in order to improve the following deficits and impairments:  Abnormal gait, Decreased mobility, Decreased activity tolerance, Decreased endurance, Decreased strength, Decreased balance, Difficulty walking  Visit Diagnosis: Difficulty in walking, not elsewhere classified  Muscle weakness (generalized)     Problem List There are no active problems to display for this patient.   Trotter,Margaret PT, [name] 09/07/2015, 11:40 AM  [place] MAIN [facility] SERVICES [address] [place], [place], [address] Phone: [phone]   Fax:  [phone]  Name: Jeremy Filmore Sr. MRN: [id number] Date of Birth: 12/19/1950

## 2015-09-07 NOTE — Therapy (Signed)
[place] MAIN [facility] SERVICES [address] [place], [place], [address] Phone: [phone]   Fax:  [phone]  Speech Language Pathology Treatment  Patient Details  Name: Jeremy Harrell Sr. MRN: [id number] Date of Birth: 1950/11/02 Referring Provider: Dr. Caryl Comes  Encounter Date: 09/07/2015      End of Session - 09/07/15 1055    Visit Number 3   Number of Visits 17   Date for SLP Re-Evaluation 10/27/15   SLP Start Time 0900   SLP Stop Time  [id number]   SLP Time Calculation (min) 55 min   Activity Tolerance Patient tolerated treatment well      Past Medical History  Diagnosis Date  . Arthritis pain   . Polio     age 65    Past Surgical History  Procedure Laterality Date  . Colonoscopy  2013    Dr Vira Agar  . Hernia repair Right 03/28/14    inguinal hernia repair    There were no vitals filed for this visit.      Subjective Assessment - 09/07/15 1054    Subjective Patient's language skills are back to baseline established 08/25/2015    Patient is accompained by: Family member   Currently in Pain? No/denies               ADULT SLP TREATMENT - 09/07/15 0001    General Information   Behavior/Cognition Alert;Cooperative;Pleasant mood   HPI CVA 06/11/2015   Treatment Provided   Treatment provided Cognitive-Linquistic   Pain Assessment   Pain Assessment No/denies pain   Cognitive-Linquistic Treatment   Treatment focused on Aphasia   Skilled Treatment HIGH LEVEL WORD FINDING:  Name (concrete) category given 3 members with 68% accuracy.  Deduce item given 3 clue words with 68% accuracy.  Rapid naming: name pictured objects with 90% accuracy.  CONNECTED SPEECH: Generate simple phrase/sentence given simple picture stimulus with 70% accuracy.   Assessment / Recommendations / Plan   Plan Continue with current plan of care   Progression Toward Goals   Progression toward goals Progressing toward goals          SLP  Education - 09/07/15 1055    Education provided Yes   Education Details strategies to convey meaning   Person(s) Educated Patient;Spouse   Methods Explanation   Comprehension Verbalized understanding            SLP Long Term Goals - 08/25/15 1337    SLP LONG TERM GOAL #1   Title Patient will generate grammatical and cogent sentence to complete simple/concrete linguistic task with 80% accuracy.   Time 8   Period Weeks   Status New   SLP LONG TERM GOAL #2   Title Patient will generate grammatical and cogent sentence to complete abstract/complex linguistic task with 80% accuracy.   Time 8   Period Weeks   Status New   SLP LONG TERM GOAL #3   Title Patient will complete high level word finding tasks with 80% accuracy.   Time 8   Period Weeks   Status New          Plan - 09/07/15 1055    Clinical Impression Statement The patient demonstrates information content, fluency, grammatical competence, and paraphasias of spontaneous speech back to baseline.    Speech Therapy Frequency 2x / week   Duration Other (comment)   Treatment/Interventions Language facilitation;Patient/family education;SLP instruction and feedback;Multimodal communcation approach   Potential to Achieve Goals Good   Potential Considerations Ability  to learn/carryover information;Co-morbidities;Cooperation/participation level;Medical prognosis;Previous level of function;Severity of impairments;Family/community support   Consulted and Agree with Plan of Care Patient;Family member/caregiver   Family Member Consulted Spouse      Patient will benefit from skilled therapeutic intervention in order to improve the following deficits and impairments:   Aphasia    Problem List There are no active problems to display for this patient.  [name], MS/CCC- SLP   [name] 09/07/2015, 10:56 AM  [place] MAIN [facility] SERVICES [address] [place], [place],  [address] Phone: [phone]   Fax:  [phone]   Name: Zaquan Whisler Sr. MRN: [id number] Date of Birth: [birth year]

## 2015-09-08 NOTE — Therapy (Signed)
[place] MAIN [facility] SERVICES [address] [place], [place], [address] Phone: [phone]   Fax:  [phone]  Occupational Therapy Treatment  Patient Details  Name: Jeremy Bacho Sr. MRN: [id number] Date of Birth: December 24, 1950 Referring Provider: Ramonita Lab  Encounter Date: 09/07/2015      OT End of Session - 09/07/15 1628    Visit Number 5   Number of Visits 16   Date for OT Re-Evaluation 10/03/15   Authorization Type Medicare G codes 5   OT Start Time 1000   OT Stop Time 1030   OT Time Calculation (min) 30 min   Activity Tolerance Patient tolerated treatment well   Behavior During Therapy [facility] for tasks assessed/performed      Past Medical History  Diagnosis Date  . Arthritis pain   . Polio     age 65    Past Surgical History  Procedure Laterality Date  . Colonoscopy  2013    Dr Vira Agar  . Hernia repair Right 03/28/14    inguinal hernia repair    There were no vitals filed for this visit.      Subjective Assessment - 09/07/15 1626    Subjective  Patient reports he has not been doing exercises at home, when asked why, he responded he has been busy.  His wife was present during session and shaking her head indicating patient has not been busy.     Patient is accompained by: Family member   Patient Stated Goals "to use my right hand better especially for writing"   Currently in Pain? No/denies   Pain Score 0-No pain   Multiple Pain Sites No                      OT Treatments/Exercises (OP) - 09/07/15 1600    Fine Motor Coordination   Other Fine Motor Exercises Patient seen this date for fine motor coordination tasks with manipulation of coins from tabletop with right hand and placing into resistive bank to combine finger strengthening, he was able to pick up and move coins through his hand and into the palm, using the hand for storage, moved coins back to fingertips and placed into bank with cues.  Manipulation of [place] discs for turning and flipping for manipulation skills as well as speed and dexterity with cues from therapist. Knotting and unknotting exercises with bilateral UE use with right hand has the lead on the task.      Neurological Re-education Exercises   Other Exercises 1 Dowel exercises with 3# for shoulder flexion, ABD, ADD, chest press, forwards circles, backwards circles, elbow flexion extension for 15 reps each for 2 sets for UE stregnthening, rest breaks as needed.  Cues for technique and form.                 OT Education - 09/07/15 1627    Education provided Yes   Education Details HEP, coordination, strengthening.   Person(s) Educated Patient;Spouse   Methods Explanation;Demonstration;Verbal cues   Comprehension Verbal cues required;Returned demonstration;Verbalized understanding             OT Long Term Goals - 08/28/15 1717    OT LONG TERM GOAL #1   Title Patient will improve RUE strength by 1 mm grade to be able to lift items up to 8# without difficulty.     Baseline difficulty with using right hand to lift items such as milk jug.    Time 8  Period Weeks   Status On-going   OT LONG TERM GOAL #2   Title Patient will increase right hand grip strength by 10# to be able to open jars and containers with modified independence.   Baseline unable   Time 8   Period Weeks   Status On-going   OT LONG TERM GOAL #3   Title Patient will improve right hand coordination to write his name and address on important papers with 90% legibility.   Baseline legibility less than 25%.   Time 8   Period Weeks   Status On-going   OT LONG TERM GOAL #4   Title Patient will complete lower body dressing with modified independence including donning socks.   Baseline difficulty with donning socks and needs occasional assist.   Time 8   Period Weeks   Status On-going   OT LONG TERM GOAL #5   Title Patient will perform house hold chores and yard work with  supervision.   Baseline unable    Time 8   Period Weeks   Status On-going   OT LONG TERM GOAL #6   Title Patient will improve fine motor coordination to pick up and manipulate coins to make change independently.   Baseline difficulty with manipulation of small objects.   Time 8   Period Weeks   Status On-going               Plan - 09/07/15 1629    Clinical Impression Statement Patient has continued to make progress with coordination skills and manipulation of items, strength is also improving but still needs additional focus to increase in order to perform daily tasks. Wife is concerned about patient not doing much at home and not performing exercises routinely.  Encouraged patient to perform exercises and be more active to achieve the best results for his UE.    Rehab Potential Good   OT Frequency 2x / week   OT Duration 8 weeks   OT Treatment/Interventions Self-care/ADL training;Therapeutic exercise;Patient/family education;Neuromuscular education;Manual Therapy;Balance training;Therapeutic exercises;DME and/or AE instruction;Therapeutic activities;Cognitive remediation/compensation   Consulted and Agree with Plan of Care Patient      Patient will benefit from skilled therapeutic intervention in order to improve the following deficits and impairments:  Decreased activity tolerance, Decreased knowledge of use of DME, Decreased strength, Impaired flexibility, Decreased balance, Decreased cognition, Decreased range of motion, Decreased coordination, Impaired UE functional use, Difficulty walking, Decreased mobility  Visit Diagnosis: Muscle weakness (generalized)  Other lack of coordination    Problem List There are no active problems to display for this patient.  Achilles Dunk, OTR/L, CLT  Lovett,Amy 09/08/2015, 1:14 PM  [place] MAIN [facility] SERVICES [address] [place], [place], [address] Phone: [phone]   Fax:   [phone]  Name: Jeremy Cochrane Sr. MRN: [id number] Date of Birth: 26-Oct-1950

## 2015-09-11 ENCOUNTER — Encounter: Payer: Self-pay | Admitting: Physical Therapy

## 2015-09-11 ENCOUNTER — Ambulatory Visit: Payer: PPO | Admitting: Occupational Therapy

## 2015-09-11 ENCOUNTER — Ambulatory Visit: Payer: PPO | Admitting: Physical Therapy

## 2015-09-11 ENCOUNTER — Encounter: Payer: Self-pay | Admitting: Speech Pathology

## 2015-09-11 ENCOUNTER — Ambulatory Visit: Payer: PPO | Admitting: Speech Pathology

## 2015-09-11 DIAGNOSIS — R262 Difficulty in walking, not elsewhere classified: Secondary | ICD-10-CM

## 2015-09-11 DIAGNOSIS — M6281 Muscle weakness (generalized): Secondary | ICD-10-CM

## 2015-09-11 DIAGNOSIS — E785 Hyperlipidemia, unspecified: Secondary | ICD-10-CM | POA: Diagnosis not present

## 2015-09-11 DIAGNOSIS — R4701 Aphasia: Secondary | ICD-10-CM | POA: Diagnosis not present

## 2015-09-11 DIAGNOSIS — Z125 Encounter for screening for malignant neoplasm of prostate: Secondary | ICD-10-CM | POA: Diagnosis not present

## 2015-09-11 DIAGNOSIS — Z7982 Long term (current) use of aspirin: Secondary | ICD-10-CM | POA: Diagnosis not present

## 2015-09-11 DIAGNOSIS — R278 Other lack of coordination: Secondary | ICD-10-CM

## 2015-09-11 DIAGNOSIS — Z1159 Encounter for screening for other viral diseases: Secondary | ICD-10-CM | POA: Diagnosis not present

## 2015-09-11 NOTE — Therapy (Signed)
[place] MAIN [facility] SERVICES [address] [place], [place], [address] Phone: [phone]   Fax:  [phone]  Physical Therapy Treatment  Patient Details  Name: Eiji Bodle Sr. MRN: [id number] Date of Birth: 08-08-1950 Referring Provider: Dr. Kerrin Mo  Encounter Date: 09/11/2015      PT End of Session - 09/11/15 0944    Visit Number 8   Number of Visits 17   Date for PT Re-Evaluation 10/02/15   Authorization Type gcode 7   Authorization Time Period 10   PT Start Time 0930   PT Stop Time 1010   PT Time Calculation (min) 40 min   Equipment Utilized During Treatment Gait belt   Activity Tolerance Patient tolerated treatment well   Behavior During Therapy [facility] for tasks assessed/performed      Past Medical History  Diagnosis Date  . Arthritis pain   . Polio     age 65    Past Surgical History  Procedure Laterality Date  . Colonoscopy  2013    Dr Vira Agar  . Hernia repair Right 03/28/14    inguinal hernia repair    There were no vitals filed for this visit.      Subjective Assessment - 09/11/15 0938    Subjective Patient moved over the weekend and he is sore in his legs and low back.    Currently in Pain? Yes   Pain Score 8    Pain Location Back   Pain Descriptors / Indicators Burning   Pain Type Acute pain   Pain Radiating Towards BLE pain L > R    Pain Onset Today   Aggravating Factors  lifting and carrying     Patient in hooklying with trunk rotation left and right with TA contraction x 20 x 2 Hooklying marching with TA x 20 x 2 LE hip flex stretch to chest BLE 30 sec x 3 Standing lumbar extension x 10 x 2 Patient has decreased pain from 8/10 to 6/10  Instructed in patient HEP for heat and ice alternating and standing lumbar extension and hooklying exercises.                            PT Education - 09/11/15 0941    Education provided Yes   Education Details heat   Person(s)  Educated Patient   Methods Explanation   Comprehension Verbalized understanding             PT Long Term Goals - 08/14/15 1636    PT LONG TERM GOAL #1   Title Patient will be independent in home exercise program to improve strength/mobility for better functional independence with ADLs   Time 8   Period Weeks   Status New   PT LONG TERM GOAL #2   Title Patient (> 3 years old) will complete five times sit to stand test in < 15 seconds indicating an increased LE strength and improved balance.   Time 8   Period Weeks   Status New   PT LONG TERM GOAL #3   Title . Patient will increase 10 meter walk test to >1.53m/s as to improve gait speed for better community ambulation and to reduce fall risk   Time 8   Period Weeks   Status New               Plan - 09/11/15 0944    Clinical Impression Statement Patient has slow gait  speed and pain in low back 8/10. Patient is able to perform basic LE and low back exercises today to decrease his pain behaviors.   Rehab Potential Good   Clinical Impairments Affecting Rehab Potential  impaired standing balance, weakess RLE hip add/extension and PF B ankles   PT Frequency 2x / week   PT Duration 8 weeks   PT Treatment/Interventions Therapeutic activities;Therapeutic exercise;Balance training;Functional mobility training;Gait training;Stair training   PT Next Visit Plan strengthening and balance training   PT Home Exercise Plan continue as given;    Consulted and Agree with Plan of Care Patient      Patient will benefit from skilled therapeutic intervention in order to improve the following deficits and impairments:  Abnormal gait, Decreased mobility, Decreased activity tolerance, Decreased endurance, Decreased strength, Decreased balance, Difficulty walking  Visit Diagnosis: Difficulty in walking, not elsewhere classified  Muscle weakness (generalized)       G-Codes - 2015/10/08 0946    Functional Assessment Tool Used clinical  judgement unable to perform outcomemeasures due to 8/10 low back pain   Functional Limitation Mobility: Walking and moving around   Mobility: Walking and Moving Around Current Status [id number]) At least 1 percent but less than 20 percent impaired, limited or restricted   Mobility: Walking and Moving Around Goal Status [id number]) At least 1 percent but less than 20 percent impaired, limited or restricted      Problem List There are no active problems to display for this patient. Alanson Puls, PT, [name]  [place], Minette Headland S October 08, 2015, 10:25 AM  [place] MAIN [facility] SERVICES [address] [place], [place], [address] Phone: [phone]   Fax:  [phone]  Name: Aethan Barnaba Sr. MRN: [id number] Date of Birth: 27-Oct-1950

## 2015-09-11 NOTE — Therapy (Signed)
[place] MAIN [facility] SERVICES [address] [place], [place], [address] Phone: [phone]   Fax:  [phone]  Speech Language Pathology Treatment  Patient Details  Name: Jeremy Stallman Sr. MRN: [id number] Date of Birth: [birth year] Referring Provider: Dr. Caryl Comes  Encounter Date: 09/11/2015      End of Session - 09/11/15 1457    Visit Number 4   Number of Visits 17   Date for SLP Re-Evaluation 10/27/15   SLP Start Time 41   SLP Stop Time  1200   SLP Time Calculation (min) 60 min   Activity Tolerance Patient tolerated treatment well      Past Medical History  Diagnosis Date  . Arthritis pain   . Polio     age 2    Past Surgical History  Procedure Laterality Date  . Colonoscopy  2013    Dr Vira Agar  . Hernia repair Right 03/28/14    inguinal hernia repair    There were no vitals filed for this visit.      Subjective Assessment - 09/11/15 1453    Subjective Patient's language skills are back to baseline established 08/25/2015    Patient is accompained by: Family member   Currently in Pain? No/denies               ADULT SLP TREATMENT - 09/11/15 0001    General Information   Behavior/Cognition Alert;Cooperative;Pleasant mood   HPI CVA 06/11/2015   Treatment Provided   Treatment provided Cognitive-Linquistic   Pain Assessment   Pain Assessment No/denies pain   Cognitive-Linquistic Treatment   Treatment focused on Aphasia   Skilled Treatment HIGH LEVEL WORD FINDING:  Name (abstract) category given 3 members with 65% accuracy.  Deduce item given 3 clue words with 67% accuracy.  CONNECTED SPEECH: Generate phrase/sentence given moderately complex picture stimulus with 65% accuracy.   Assessment / Recommendations / Plan   Plan Continue with current plan of care   Progression Toward Goals   Progression toward goals Progressing toward goals          SLP Education - 09/11/15 1457    Education provided Yes    Education Details Strategies to improve word finding   Person(s) Educated Patient;Spouse   Methods Explanation   Comprehension Verbalized understanding            SLP Long Term Goals - 08/25/15 1337    SLP LONG TERM GOAL #1   Title Patient will generate grammatical and cogent sentence to complete simple/concrete linguistic task with 80% accuracy.   Time 8   Period Weeks   Status New   SLP LONG TERM GOAL #2   Title Patient will generate grammatical and cogent sentence to complete abstract/complex linguistic task with 80% accuracy.   Time 8   Period Weeks   Status New   SLP LONG TERM GOAL #3   Title Patient will complete high level word finding tasks with 80% accuracy.   Time 8   Period Weeks   Status New          Plan - 09/11/15 1458    Clinical Impression Statement The patient demonstrates information content, fluency, grammatical competence, and paraphasias of spontaneous speech back to baseline.    Speech Therapy Frequency 2x / week   Duration Other (comment)   Treatment/Interventions Language facilitation;Patient/family education;SLP instruction and feedback;Multimodal communcation approach   Potential to Achieve Goals Good   Potential Considerations Ability to learn/carryover information;Co-morbidities;Cooperation/participation level;Medical prognosis;Previous level of function;Severity  of impairments;Family/community support   Consulted and Agree with Plan of Care Patient;Family member/caregiver   Family Member Consulted Spouse      Patient will benefit from skilled therapeutic intervention in order to improve the following deficits and impairments:   Aphasia    Problem List There are no active problems to display for this patient.  [name], MS/CCC- SLP [name] 09/11/2015, 2:58 PM  [place] MAIN [facility] SERVICES [address] [place], [place], [address] Phone: [phone]   Fax:   [phone]   Name: Jeremy Batterton Sr. MRN: [id number] Date of Birth: February 06, 1951

## 2015-09-13 ENCOUNTER — Encounter: Payer: Self-pay | Admitting: Speech Pathology

## 2015-09-13 ENCOUNTER — Encounter: Payer: Self-pay | Admitting: Physical Therapy

## 2015-09-13 ENCOUNTER — Ambulatory Visit: Payer: PPO | Admitting: Physical Therapy

## 2015-09-13 ENCOUNTER — Encounter: Payer: Self-pay | Admitting: Occupational Therapy

## 2015-09-13 ENCOUNTER — Ambulatory Visit: Payer: PPO | Admitting: Speech Pathology

## 2015-09-13 DIAGNOSIS — R4701 Aphasia: Secondary | ICD-10-CM

## 2015-09-13 DIAGNOSIS — M6281 Muscle weakness (generalized): Secondary | ICD-10-CM

## 2015-09-13 DIAGNOSIS — R262 Difficulty in walking, not elsewhere classified: Secondary | ICD-10-CM

## 2015-09-13 NOTE — Therapy (Signed)
[place] MAIN [facility] SERVICES [address] [place], [place], [address] Phone: [phone]   Fax:  [phone]  Speech Language Pathology Treatment  Patient Details  Name: Jeremy Ada Harrell. MRN: [id number] Date of Birth: 1950-09-17 Referring Provider: Dr. Caryl Comes  Encounter Date: 09/13/2015      End of Session - 09/13/15 1602    Visit Number 5   Number of Visits 17   Date for SLP Re-Evaluation 10/27/15   SLP Start Time 65   SLP Stop Time  1200   SLP Time Calculation (min) 60 min   Activity Tolerance Patient tolerated treatment well      Past Medical History  Diagnosis Date  . Arthritis pain   . Polio     age 65    Past Surgical History  Procedure Laterality Date  . Colonoscopy  2013    Dr Vira Agar  . Hernia repair Right 03/28/14    inguinal hernia repair    There were no vitals filed for this visit.      Subjective Assessment - 09/13/15 1602    Subjective Jeremy Harrell was alert and engaged throughout the session. He feels that he is continuing to make progress but that some days are better than others. He also expressed that he misses his old voice but would rather target speech first and then address volume and quality concerns. When asked, he said that he notices his paraphasias "sometimes".   Currently in Pain? No/denies               ADULT SLP TREATMENT - 09/13/15 0001    General Information   Behavior/Cognition Alert;Cooperative;Pleasant mood   HPI CVA 06/11/2015   Treatment Provided   Treatment provided Cognitive-Linquistic   Pain Assessment   Pain Assessment No/denies pain   Cognitive-Linquistic Treatment   Treatment focused on Aphasia   Skilled Treatment HIGH LEVEL WORD FINDING: [name] was able to name an item when given 3 associated words with 60% accuracy. This increased to 95% accuracy with moderate cues (repetition, talking through the reasoning) from the clinician. When provided with 5 words,  he was able to determine which didn't match the others and why 50% of the time independently and 100% of the time with moderate cues (repetition, forced choice, leading questions) from the clinician. CONNECTED SPEECH: When provided with picture stimuli, Jeremy Harrell was able to produce moderately complex phrases and sentences to describe and discuss the images with 75% accuracy. Persistent paraphasias were noted during both spontaneous and structured speech.   Assessment / Recommendations / Plan   Plan Continue with current plan of care   Progression Toward Goals   Progression toward goals Progressing toward goals          SLP Education - 09/13/15 1602    Education provided Yes   Education Details Vocal projection   Person(s) Educated Patient   Methods Explanation   Comprehension Verbalized understanding            SLP Long Term Goals - 08/25/15 1337    SLP LONG TERM GOAL #1   Title Patient will generate grammatical and cogent sentence to complete simple/concrete linguistic task with 80% accuracy.   Time 8   Period Weeks   Status New   SLP LONG TERM GOAL #2   Title Patient will generate grammatical and cogent sentence to complete abstract/complex linguistic task with 80% accuracy.   Time 8   Period Weeks   Status New  SLP LONG TERM GOAL #3   Title Patient will complete high level word finding tasks with 80% accuracy.   Time 8   Period Weeks   Status New          Plan - 09/13/15 1603    Clinical Impression Statement Jeremy Harrell has maintained his progress from the previous session. He continues to struggle with word finding as well as phonemic and semantic paraphasias which hinder his ability to communicate efficiently. He will benefit from continued skilled intervention to work on awareness and strategies to improve his communication.   Speech Therapy Frequency 2x / week   Duration Other (comment)   Treatment/Interventions Language facilitation;Patient/family  education;SLP instruction and feedback;Multimodal communcation approach   Potential to Achieve Goals Good   Potential Considerations Ability to learn/carryover information;Co-morbidities;Cooperation/participation level;Medical prognosis;Previous level of function;Severity of impairments;Family/community support   Consulted and Agree with Plan of Care Patient      Patient will benefit from skilled therapeutic intervention in order to improve the following deficits and impairments:   Aphasia    Problem List There are no active problems to display for this patient.  [name], [place], Susie 09/13/2015, 4:03 PM  [place] MAIN [facility] SERVICES [address] [place], [place], [address] Phone: [phone]   Fax:  [phone]   Name: Jeremy Harrell. MRN: [id number] Date of Birth: 11-Sep-1950

## 2015-09-13 NOTE — Therapy (Signed)
[place] MAIN [facility] SERVICES [address] [place], [place], [address] Phone: [phone]   Fax:  [phone]  Occupational Therapy Treatment  Patient Details  Name: Jeremy Bendik Sr. MRN: [id number] Date of Birth: March 08, 1951 Referring Provider: Ramonita Lab  Encounter Date: 09/11/2015      OT End of Session - 09/13/15 1112    Visit Number 6   Number of Visits 16   Date for OT Re-Evaluation 10/03/15   Authorization Type Medicare G codes 6   OT Start Time [id number]   OT Stop Time 1100   OT Time Calculation (min) 45 min   Activity Tolerance Patient tolerated treatment well   Behavior During Therapy [facility] for tasks assessed/performed      Past Medical History  Diagnosis Date  . Arthritis pain   . Polio     age 65    Past Surgical History  Procedure Laterality Date  . Colonoscopy  2013    Dr Vira Agar  . Hernia repair Right 03/28/14    inguinal hernia repair    There were no vitals filed for this visit.      Subjective Assessment - 09/13/15 1049    Subjective  Patient reports they were moving this past weekend and he hurt his back.  Willing to work with therapy but requested to not perform lifting or overhead activities.    Patient is accompained by: Family member   Patient Stated Goals "to use my right hand better especially for writing"   Currently in Pain? Yes   Pain Score 6    Pain Location Back   Pain Orientation Lower   Pain Descriptors / Indicators Discomfort;Aching   Pain Onset In the past 7 days   Pain Frequency Intermittent   Multiple Pain Sites No                      OT Treatments/Exercises (OP) - 09/13/15 1052    ADLs   Writing Patient seen this date for emphasis on handwriting skills with right hand to complete demographic information to fill out important documents/papers.  Patient utilizing a regular pen and then a built up pen for comparison.  Instructed patient to print information for  increased legibility.  Cues for managing pen and picking up the pen between letters to avoid extra strokes.   Fine Motor Coordination   Other Fine Motor Exercises Patient seen this date for fine motor coordination tasks with manipulation of small pegs  inch in size to place and remove into board, cues for translatory skills and using the hand for storage.  Completed grooved pegs with cues, speed and dexterity improving with manipulation of smaller objects.   Neurological Re-education Exercises   Other Exercises 1 Patient seen for grip strengthening tasks on the right UE, 17# for 25 reps then 23# for 25 reps each, cues as needed for technique.                  OT Education - 09/13/15 1112    Education provided Yes   Education Details HEP   Person(s) Educated Patient   Methods Explanation;Demonstration;Verbal cues   Comprehension Verbal cues required;Returned demonstration;Verbalized understanding             OT Long Term Goals - 08/28/15 1717    OT LONG TERM GOAL #1   Title Patient will improve RUE strength by 1 mm grade to be able to lift items up to 8# without difficulty.  Baseline difficulty with using right hand to lift items such as milk jug.    Time 8   Period Weeks   Status On-going   OT LONG TERM GOAL #2   Title Patient will increase right hand grip strength by 10# to be able to open jars and containers with modified independence.   Baseline unable   Time 8   Period Weeks   Status On-going   OT LONG TERM GOAL #3   Title Patient will improve right hand coordination to write his name and address on important papers with 90% legibility.   Baseline legibility less than 25%.   Time 8   Period Weeks   Status On-going   OT LONG TERM GOAL #4   Title Patient will complete lower body dressing with modified independence including donning socks.   Baseline difficulty with donning socks and needs occasional assist.   Time 8   Period Weeks   Status On-going   OT  LONG TERM GOAL #5   Title Patient will perform house hold chores and yard work with supervision.   Baseline unable    Time 8   Period Weeks   Status On-going   OT LONG TERM GOAL #6   Title Patient will improve fine motor coordination to pick up and manipulate coins to make change independently.   Baseline difficulty with manipulation of small objects.   Time 8   Period Weeks   Status On-going               Plan - 09/13/15 1112    Clinical Impression Statement Patient was limited this date by back pain from moving this weekend from his home to an apartment.  He was able to participate in light therapeutic activities, with focus on handwriting, coordination and hand strength for the right UE. Will continue to progress in these areas to improve function for daily tasks at home.    Rehab Potential Good   OT Frequency 2x / week   OT Duration 8 weeks   OT Treatment/Interventions Self-care/ADL training;Therapeutic exercise;Patient/family education;Neuromuscular education;Manual Therapy;Balance training;Therapeutic exercises;DME and/or AE instruction;Therapeutic activities;Cognitive remediation/compensation   Consulted and Agree with Plan of Care Patient      Patient will benefit from skilled therapeutic intervention in order to improve the following deficits and impairments:  Decreased activity tolerance, Decreased knowledge of use of DME, Decreased strength, Impaired flexibility, Decreased balance, Decreased cognition, Decreased range of motion, Decreased coordination, Impaired UE functional use, Difficulty walking, Decreased mobility  Visit Diagnosis: Muscle weakness (generalized)  Other lack of coordination    Problem List There are no active problems to display for this patient.  Achilles Dunk, OTR/L, CLT  Lovett,Amy 09/13/2015, 11:16 AM  [place] MAIN [facility] SERVICES [address] [place], [place], [address] Phone: [phone]    Fax:  [phone]  Name: Jeremy Sarpy Sr. MRN: [id number] Date of Birth: Jul 01, 1950

## 2015-09-13 NOTE — Therapy (Signed)
[place] MAIN [facility] SERVICES [address] [place], [place], [address] Phone: [phone]   Fax:  [phone]  Physical Therapy Treatment  Patient Details  Name: Jeremy Sibbett Sr. MRN: [id number] Date of Birth: [birth year] Referring Provider: Dr. Kerrin Mo  Encounter Date: 09/13/2015      PT End of Session - 09/13/15 1031    Visit Number 9   Number of Visits 17   Date for PT Re-Evaluation 10/02/15   Authorization Type gcode 7   Authorization Time Period 10   PT Start Time 1015   PT Stop Time 1100   PT Time Calculation (min) 45 min   Equipment Utilized During Treatment Gait belt   Activity Tolerance Patient tolerated treatment well   Behavior During Therapy [employer] for tasks assessed/performed      Past Medical History  Diagnosis Date  . Arthritis pain   . Polio     age 65    Past Surgical History  Procedure Laterality Date  . Colonoscopy  2013    Dr Vira Agar  . Hernia repair Right 03/28/14    inguinal hernia repair    There were no vitals filed for this visit.      Subjective Assessment - 09/13/15 1030    Subjective Patient moved over the weekend and he is sore in his legs and low back.    Pertinent History cva 2 months ago   Currently in Pain? Yes   Pain Score 5    Pain Location Back   Pain Onset Today      OUTCOME MEASURES: TEST  Outcome  Interpretation   5 times sit<>stand  10.05sec  >72 yo, >15 sec indicates increased risk for falls   10 meter walk test    1.35           m/s  <1.0 m/s indicates increased risk for falls; limited community ambulator   Timed up and Go   8.14             sec  <14 sec indicates increased risk for falls   6 minute walk test    1064           Feet  1000 feet is community ambulator      THER-EX TM  1.2 miles / hour x 5 minutes for warm-up , side stepping left and right for 3 mins at . 4 miles / hour Quantum double leg press 90 lbs Heel raises with UE support and toes on 2x4, 2 x  10; Mini squats with RTB around knees to prevent valgus 2 x 10; RTB side stepping in // bars 4 lengths x 2; Sit to stand without UE support RTB around knees to prevent valgus 2 x 10 Patient is progressing towards goals and is back pain is somewhat better and he was able to perform his outcome measures today .                          PT Education - 09/13/15 1031    Education provided Yes   Education Details ice and heat   Person(s) Educated Patient   Methods Explanation   Comprehension Verbalized understanding             PT Long Term Goals - 08/14/15 1636    PT LONG TERM GOAL #1   Title Patient will be independent in home exercise program to improve strength/mobility for better functional independence with ADLs  Time 8   Period Weeks   Status New   PT LONG TERM GOAL #2   Title Patient (> 34 years old) will complete five times sit to stand test in < 15 seconds indicating an increased LE strength and improved balance.   Time 8   Period Weeks   Status New   PT LONG TERM GOAL #3   Title . Patient will increase 10 meter walk test to >1.10m/s as to improve gait speed for better community ambulation and to reduce fall risk   Time 8   Period Weeks   Status New               Plan - 2015-09-26 1032    Clinical Impression Statement Pt needed mod cueing when performing standing hip abd to stand up tall and keep toe pointed forward. Patient is able to perform outcome measures today and is  ambulating with less antalgic gait.    Rehab Potential Good   Clinical Impairments Affecting Rehab Potential  impaired standing balance, weakess RLE hip add/extension and PF B ankles   PT Frequency 2x / week   PT Duration 8 weeks   PT Treatment/Interventions Therapeutic activities;Therapeutic exercise;Balance training;Functional mobility training;Gait training;Stair training   PT Next Visit Plan strengthening and balance training   PT Home Exercise Plan continue as  given;    Consulted and Agree with Plan of Care Patient      Patient will benefit from skilled therapeutic intervention in order to improve the following deficits and impairments:  Abnormal gait, Decreased mobility, Decreased activity tolerance, Decreased endurance, Decreased strength, Decreased balance, Difficulty walking  Visit Diagnosis: Difficulty in walking, not elsewhere classified  Muscle weakness (generalized)       G-Codes - 2015-09-26 1040    Functional Assessment Tool Used 5 times sit to stand , TUg, 10 MW 6 MW   Functional Limitation Mobility: Walking and moving around   Mobility: Walking and Moving Around Current Status [id number]) At least 1 percent but less than 20 percent impaired, limited or restricted   Mobility: Walking and Moving Around Goal Status [id number]) 0 percent impaired, limited or restricted      Problem List There are no active problems to display for this patient. Alanson Puls, PT, [name]  [place], Minette Headland S 2015-09-26, 10:45 AM  [place] MAIN [facility] SERVICES [address] [place], [place], [address] Phone: [phone]   Fax:  [phone]  Name: Jeremy Kister Sr. MRN: [id number] Date of Birth: [birth year]

## 2015-09-18 ENCOUNTER — Encounter: Payer: Self-pay | Admitting: Occupational Therapy

## 2015-09-18 ENCOUNTER — Encounter: Payer: Self-pay | Admitting: Speech Pathology

## 2015-09-18 ENCOUNTER — Ambulatory Visit: Payer: Self-pay | Admitting: Physical Therapy

## 2015-09-18 DIAGNOSIS — I739 Peripheral vascular disease, unspecified: Secondary | ICD-10-CM | POA: Diagnosis not present

## 2015-09-18 DIAGNOSIS — R739 Hyperglycemia, unspecified: Secondary | ICD-10-CM | POA: Diagnosis not present

## 2015-09-18 DIAGNOSIS — E785 Hyperlipidemia, unspecified: Secondary | ICD-10-CM | POA: Diagnosis not present

## 2015-09-18 DIAGNOSIS — I69359 Hemiplegia and hemiparesis following cerebral infarction affecting unspecified side: Secondary | ICD-10-CM | POA: Diagnosis not present

## 2015-09-18 DIAGNOSIS — F325 Major depressive disorder, single episode, in full remission: Secondary | ICD-10-CM | POA: Diagnosis not present

## 2015-09-18 DIAGNOSIS — Z8673 Personal history of transient ischemic attack (TIA), and cerebral infarction without residual deficits: Secondary | ICD-10-CM | POA: Diagnosis not present

## 2015-09-20 ENCOUNTER — Ambulatory Visit: Payer: PPO | Admitting: Speech Pathology

## 2015-09-20 ENCOUNTER — Ambulatory Visit: Payer: PPO | Admitting: Physical Therapy

## 2015-09-20 ENCOUNTER — Encounter: Payer: Self-pay | Admitting: Speech Pathology

## 2015-09-20 ENCOUNTER — Encounter: Payer: Self-pay | Admitting: Occupational Therapy

## 2015-09-20 ENCOUNTER — Ambulatory Visit: Payer: PPO | Admitting: Occupational Therapy

## 2015-09-20 ENCOUNTER — Encounter: Payer: Self-pay | Admitting: Physical Therapy

## 2015-09-20 DIAGNOSIS — R278 Other lack of coordination: Secondary | ICD-10-CM

## 2015-09-20 DIAGNOSIS — M6281 Muscle weakness (generalized): Secondary | ICD-10-CM

## 2015-09-20 DIAGNOSIS — R262 Difficulty in walking, not elsewhere classified: Secondary | ICD-10-CM

## 2015-09-20 DIAGNOSIS — R4701 Aphasia: Secondary | ICD-10-CM | POA: Diagnosis not present

## 2015-09-20 NOTE — Therapy (Signed)
[place] MAIN [facility] SERVICES [address] [place], [place], [address] Phone: [phone]   Fax:  [phone]  Speech Language Pathology Treatment  Patient Details  Name: Jeremy Pavlock Sr. MRN: [id number] Date of Birth: Jan 08, 1951 Referring Provider: Dr. Caryl Comes  Encounter Date: 09/20/2015      End of Session - 09/20/15 1706    Visit Number 6   Number of Visits 17   Date for SLP Re-Evaluation 10/27/15   SLP Start Time 8   SLP Stop Time  1200   SLP Time Calculation (min) 60 min   Activity Tolerance Patient tolerated treatment well      Past Medical History  Diagnosis Date  . Arthritis pain   . Polio     age 38    Past Surgical History  Procedure Laterality Date  . Colonoscopy  2013    Dr Vira Agar  . Hernia repair Right 03/28/14    inguinal hernia repair    There were no vitals filed for this visit.      Subjective Assessment - 09/20/15 1705    Subjective Jeremy Harrell reports that he is feeling better about his speech and that he can usually say what he want to as long as he doesn't speak too quickly. He states that he and his wife talk some at home and they are always able to communicate effectively.   Patient is accompained by: Family member   Currently in Pain? No/denies               ADULT SLP TREATMENT - 09/20/15 0001    General Information   Behavior/Cognition Alert;Cooperative;Pleasant mood   HPI CVA 06/11/2015   Treatment Provided   Treatment provided Cognitive-Linquistic   Pain Assessment   Pain Assessment No/denies pain   Cognitive-Linquistic Treatment   Treatment focused on Aphasia   Skilled Treatment HIGH LEVEL WORD FINDING: Jeremy Harrell was able to name an item when given 3 associated words with 65% accuracy. This increased to 95% accuracy with moderate cues (thinking of other words which describe the object, open ended questions) from the clinician. When provided with 5 words, he was able to  determine which didn't match the others and why 75% of the time independently and 100% of the time with moderate cues (repetition, forced choice) from the clinician. CONNECTED SPEECH: When provided with an open ended analogy, Jeremy Harrell was able to successfully complete the analogy with 90% accuracy with minimal cues and explain his reasoning in a complete sentence with 70% accuracy with moderate clinician cues (providing the sentence format). He produced several phonemic paraphasias during the session which he corrected when made aware.   Assessment / Recommendations / Plan   Plan Continue with current plan of care   Progression Toward Goals   Progression toward goals Progressing toward goals          SLP Education - 09/20/15 1705    Education provided Yes   Education Details [place] finding strategies   Person(s) Educated Patient;Spouse   Methods Explanation   Comprehension Verbalized understanding            SLP Long Term Goals - 08/25/15 1337    SLP LONG TERM GOAL #1   Title Patient will generate grammatical and cogent sentence to complete simple/concrete linguistic task with 80% accuracy.   Time 8   Period Weeks   Status New   SLP LONG TERM GOAL #2   Title Patient will generate grammatical and cogent  sentence to complete abstract/complex linguistic task with 80% accuracy.   Time 8   Period Weeks   Status New   SLP LONG TERM GOAL #3   Title Patient will complete high level word finding tasks with 80% accuracy.   Time 8   Period Weeks   Status New          Plan - 09/20/15 1706    Clinical Impression Statement Jeremy Harrell continues to make progress on his word finding ability. He often speaks softly which affects his intelligibility, but has not been interested in addressing this. Further skilled intervention will address advanced word finding skills and conversational strategies.   Speech Therapy Frequency 2x / week   Duration Other (comment)   Treatment/Interventions  Language facilitation;Patient/family education;SLP instruction and feedback;Multimodal communcation approach   Potential to Achieve Goals Good   Potential Considerations Ability to learn/carryover information;Co-morbidities;Cooperation/participation level;Medical prognosis;Previous level of function;Severity of impairments;Family/community support   SLP Home Exercise Plan To be determined   Consulted and Agree with Plan of Care Patient   Family Member Consulted Spouse      Patient will benefit from skilled therapeutic intervention in order to improve the following deficits and impairments:   Aphasia    Problem List There are no active problems to display for this patient.   Leroy Kennedy 09/20/2015, 5:07 PM  [place] MAIN [facility] SERVICES [address] [place], [place], [address] Phone: [phone]   Fax:  [phone]   Name: Jeremy Motton Sr. MRN: [id number] Date of Birth: 10/07/1950

## 2015-09-20 NOTE — Therapy (Signed)
[place] MAIN [facility] SERVICES [address] [place], [place], [address] Phone: [phone]   Fax:  [phone]  Physical Therapy Treatment  Patient Details  Name: Jeremy Phelan Sr. MRN: [id number] Date of Birth: 1951-01-28 Referring Provider: Dr. Kerrin Mo  Encounter Date: 09/20/2015      PT End of Session - 09/20/15 1056    Visit Number 10   Number of Visits 17   Date for PT Re-Evaluation 10/02/15   Authorization Type gcode 8   Authorization Time Period 10   PT Start Time 1016   PT Stop Time 1056   PT Time Calculation (min) 40 min   Equipment Utilized During Treatment Gait belt   Activity Tolerance Patient tolerated treatment well   Behavior During Therapy WFL for tasks assessed/performed      Past Medical History  Diagnosis Date  . Arthritis pain   . Polio     age 51    Past Surgical History  Procedure Laterality Date  . Colonoscopy  2013    Dr Vira Agar  . Hernia repair Right 03/28/14    inguinal hernia repair    There were no vitals filed for this visit.      Subjective Assessment - 09/20/15 1025    Subjective (p) Pt reports he is doing well. Still has some LBP after moving.   Pertinent History (p) cva 2 months ago   Currently in Pain? (p) Yes   Pain Score (p) 2   LBP   Pain Descriptors / Indicators (p) Aching   Pain Type (p) Acute pain   Pain Onset (p) Today   Pain Frequency (p) Intermittent     Neuro:  Agility ladder  Forward single box x2  Forward every other box x2  Side grapevine x2  Side in/out x2 with faster speed second round Resisted walking  F/B, B/F, L/R, R/L x2 each 17.5 plate  Cues for increased BOS as needed and weight shift correction to improve balance.  Gait with ball toss up/catch/bounce, side toss on wall, forward toss with PT, around the waist to challenge dynamic gait/balance and coordination for ~500 ft with min guard. No LOB.  OTIS on Airex 2x30 sec each B LE SLS, most  difficulty on L LE Seated theraball   Alt UE/LE x10 each  Marchx10 each  UE rotation x10 each Cues for postural correction and weight shifting to improve balance.                            PT Education - 09/20/15 1055    Education provided Yes   Education Details SLS corner HEP   Person(s) Educated Patient;Spouse   Methods Explanation;Demonstration;Handout   Comprehension Verbalized understanding;Returned demonstration             PT Long Term Goals - 08/14/15 1636    PT LONG TERM GOAL #1   Title Patient will be independent in home exercise program to improve strength/mobility for better functional independence with ADLs   Time 8   Period Weeks   Status New   PT LONG TERM GOAL #2   Title Patient (> 27 years old) will complete five times sit to stand test in < 15 seconds indicating an increased LE strength and improved balance.   Time 8   Period Weeks   Status New   PT LONG TERM GOAL #3   Title . Patient will increase 10 meter walk test  to >1.19m/s as to improve gait speed for better community ambulation and to reduce fall risk   Time 8   Period Weeks   Status New               Plan - 09/20/15 1056    Clinical Impression Statement Pt was able to participate in increased intensity balance training today. He demonstrated the most difficulty with SLS and side to side tasks involving multiple steps. Pt given SLS in corner for HEP.   Rehab Potential Good   Clinical Impairments Affecting Rehab Potential  impaired standing balance, weakess RLE hip add/extension and PF B ankles   PT Frequency 2x / week   PT Duration 8 weeks   PT Treatment/Interventions Therapeutic activities;Therapeutic exercise;Balance training;Functional mobility training;Gait training;Stair training   PT Next Visit Plan strengthening and balance training   PT Home Exercise Plan SLS in corner   Consulted and Agree with Plan of Care Patient      Patient will benefit from  skilled therapeutic intervention in order to improve the following deficits and impairments:  Abnormal gait, Decreased mobility, Decreased activity tolerance, Decreased endurance, Decreased strength, Decreased balance, Difficulty walking  Visit Diagnosis: Difficulty in walking, not elsewhere classified  Muscle weakness (generalized)  Other lack of coordination     Problem List There are no active problems to display for this patient.   [name] [name], PT, [name]  09/20/2015, 11:01 AM [phone]  [place] MAIN [facility] SERVICES [address] [place], [place], [address] Phone: [phone]   Fax:  [phone]  Name: Jeremy Campus Sr. MRN: [id number] Date of Birth: 03/20/1951

## 2015-09-20 NOTE — Therapy (Signed)
[place] MAIN [facility] SERVICES [address] [place], [place], [address] Phone: [phone]   Fax:  [phone]  Occupational Therapy Treatment  Patient Details  Name: Jeremy Arlinghaus Sr. MRN: [id number] Date of Birth: [birth year] Referring Provider: Ramonita Lab  Encounter Date: 09/20/2015      OT End of Session - 09/20/15 1043    Visit Number 7   Number of Visits 16   Date for OT Re-Evaluation 10/03/15   Authorization Type Medicare G codes 7   OT Start Time 0934   OT Stop Time 1015   OT Time Calculation (min) 41 min   Activity Tolerance Patient tolerated treatment well   Behavior During Therapy [facility] for tasks assessed/performed      Past Medical History  Diagnosis Date  . Arthritis pain   . Polio     age 65    Past Surgical History  Procedure Laterality Date  . Colonoscopy  2013    Dr Vira Agar  . Hernia repair Right 03/28/14    inguinal hernia repair    There were no vitals filed for this visit.      Subjective Assessment - 09/20/15 0945    Subjective  Patient reports they still have been unpacking boxes at home in their new apartment.  Got all the stuff from the house to the apartment.     Patient Stated Goals "to use my right hand better especially for writing"   Currently in Pain? No/denies                      OT Treatments/Exercises (OP) - 09/20/15 1027    ADLs   Writing Patient seen for handwriting task of making a grocery list, therapist attempting to read each item for legibility, 60% legibility this date.  After writing 10 items and writing name and address, patient's legiblity decreases and lacks strength and endurance for additional handwriting tasks,   Fine Motor Coordination   Other Fine Motor Exercises Patient seen this date for right hand manipulation of small items 1/2 inch in size and formulating 4 design template with 1-2 cues.  Manipulation of small 3/4 inch washers placing on small long  dowels placed in multi directions, cues at times for technique.  Working towards improved speed and dexterity.   Neurological Re-education Exercises   Other Exercises 1 Patient seen for reciprocal arm strengthening with UBE for 6 minutes, alternating directions forwards/backwards, resistance of 3.0 to 3.5, with therapist in constant attendance to ensure grip and change settings.    Other Exercises 2 Patient performing 3.5# dowel exercise for RUE strengthening, shoulder flexion, ABD/ADD, forwards circles, backwards circles, chest press for 10 reps for 2 sets each exercise with cues and rest breaks as needed.                  OT Education - 09/20/15 1037    Education provided Yes   Education Details HEP, encouraged patient to participate in exercises at home on  a daily basis for strength and coordination.     Person(s) Educated Patient   Methods Explanation;Demonstration;Verbal cues   Comprehension Verbal cues required;Returned demonstration;Verbalized understanding             OT Long Term Goals - 08/28/15 1717    OT LONG TERM GOAL #1   Title Patient will improve RUE strength by 1 mm grade to be able to lift items up to 8# without difficulty.     Baseline  difficulty with using right hand to lift items such as milk jug.    Time 8   Period Weeks   Status On-going   OT LONG TERM GOAL #2   Title Patient will increase right hand grip strength by 10# to be able to open jars and containers with modified independence.   Baseline unable   Time 8   Period Weeks   Status On-going   OT LONG TERM GOAL #3   Title Patient will improve right hand coordination to write his name and address on important papers with 90% legibility.   Baseline legibility less than 25%.   Time 8   Period Weeks   Status On-going   OT LONG TERM GOAL #4   Title Patient will complete lower body dressing with modified independence including donning socks.   Baseline difficulty with donning socks and needs  occasional assist.   Time 8   Period Weeks   Status On-going   OT LONG TERM GOAL #5   Title Patient will perform house hold chores and yard work with supervision.   Baseline unable    Time 8   Period Weeks   Status On-going   OT LONG TERM GOAL #6   Title Patient will improve fine motor coordination to pick up and manipulate coins to make change independently.   Baseline difficulty with manipulation of small objects.   Time 8   Period Weeks   Status On-going               Plan - 09/20/15 1044    Clinical Impression Statement Patient admits he has not been doing much execise at home but reports he is walking the dog and has been unpacking boxes from moving recently.  Encouraged patient to engage in daily exercises for RUE strengthening and coordination tasks.  He states he understands.  He has shown improvement in speed and dexterity with manipulation of small objects, however handwriting is still poor at times and demos decreased legibilty.     Rehab Potential Good   OT Frequency 2x / week   OT Duration 8 weeks   OT Treatment/Interventions Self-care/ADL training;Therapeutic exercise;Patient/family education;Neuromuscular education;Manual Therapy;Balance training;Therapeutic exercises;DME and/or AE instruction;Therapeutic activities;Cognitive remediation/compensation   Consulted and Agree with Plan of Care Patient      Patient will benefit from skilled therapeutic intervention in order to improve the following deficits and impairments:  Decreased activity tolerance, Decreased knowledge of use of DME, Decreased strength, Impaired flexibility, Decreased balance, Decreased cognition, Decreased range of motion, Decreased coordination, Impaired UE functional use, Difficulty walking, Decreased mobility  Visit Diagnosis: Muscle weakness (generalized)  Other lack of coordination    Problem List There are no active problems to display for this patient.  Achilles Dunk, OTR/L,  CLT Jeremy Harrell 09/20/2015, 10:47 AM  [place] MAIN [facility] SERVICES [address] [place], [place], [address] Phone: [phone]   Fax:  [phone]  Name: Jeremy Loiacono Sr. MRN: [id number] Date of Birth: [birth year]

## 2015-09-25 ENCOUNTER — Encounter: Payer: Self-pay | Admitting: Occupational Therapy

## 2015-09-25 ENCOUNTER — Encounter: Payer: Self-pay | Admitting: Physical Therapy

## 2015-09-25 ENCOUNTER — Ambulatory Visit: Payer: PPO | Admitting: Physical Therapy

## 2015-09-25 ENCOUNTER — Ambulatory Visit: Payer: PPO | Admitting: Occupational Therapy

## 2015-09-25 ENCOUNTER — Ambulatory Visit: Payer: PPO | Admitting: Speech Pathology

## 2015-09-25 ENCOUNTER — Encounter: Payer: Self-pay | Admitting: Speech Pathology

## 2015-09-25 DIAGNOSIS — R278 Other lack of coordination: Secondary | ICD-10-CM

## 2015-09-25 DIAGNOSIS — R4701 Aphasia: Secondary | ICD-10-CM | POA: Diagnosis not present

## 2015-09-25 DIAGNOSIS — M6281 Muscle weakness (generalized): Secondary | ICD-10-CM

## 2015-09-25 DIAGNOSIS — R262 Difficulty in walking, not elsewhere classified: Secondary | ICD-10-CM

## 2015-09-25 NOTE — Therapy (Signed)
[place] MAIN [facility] SERVICES [address] [place], [place], [address] Phone: [phone]   Fax:  [phone]  Speech Language Pathology Treatment  Patient Details  Name: Jeremy Huyck Sr. MRN: [id number] Date of Birth: [birth year] Referring Provider: Dr. Caryl Comes  Encounter Date: 09/25/2015      End of Session - 09/25/15 1335    Visit Number 7   Number of Visits 17   Date for SLP Re-Evaluation 10/27/15   SLP Start Time 11   SLP Stop Time  1200   SLP Time Calculation (min) 60 min   Activity Tolerance Patient tolerated treatment well      Past Medical History  Diagnosis Date  . Arthritis pain   . Polio     age 5    Past Surgical History  Procedure Laterality Date  . Colonoscopy  2013    Dr Vira Agar  . Hernia repair Right 03/28/14    inguinal hernia repair    There were no vitals filed for this visit.      Subjective Assessment - 09/25/15 1335    Subjective Jeremy Harrell was alert and engaged throughout the session. He feels his speech is alright, and that if he is having a good day he can say what he wants to. His family feels that his speech is still hard to understand.               ADULT SLP TREATMENT - 09/25/15 0001    General Information   Behavior/Cognition Alert;Cooperative;Pleasant mood   HPI CVA 06/11/2015   Treatment Provided   Treatment provided Cognitive-Linquistic   Pain Assessment   Pain Assessment No/denies pain   Cognitive-Linquistic Treatment   Treatment focused on Aphasia   Skilled Treatment HIGH LEVEL WORD FINDING: When provided with 2 items, Jeremy Harrell was able to name their category, how they were the same and different, and think of another object in that category with 95% accuracy with moderate cues from the clinician (open ended sentences, providing model). When provided 5 words, Jeremy Harrell was able to identify which didn't belong and why with 50% accuracy. This rose to 85% accuracy with  moderate cues from the clinician (forced choice, providing the "why"). He was able to think of synonyms for provided words with 98% accuracy with minimal cues from the clinician. When given 3 words, Jeremy Harrell was able to deduce the target word with 100% accuracy with minimal cues from the clinician. He continues to have many phonemic and semantic paraphasias at all complexities of speech.   Assessment / Recommendations / Plan   Plan Continue with current plan of care   Progression Toward Goals   Progression toward goals Progressing toward goals          SLP Education - 09/25/15 1335    Education provided Yes   Education Details Word finding strategies   Person(s) Educated Patient;Spouse   Methods Explanation   Comprehension Verbalized understanding            SLP Long Term Goals - 08/25/15 1337    SLP LONG TERM GOAL #1   Title Patient will generate grammatical and cogent sentence to complete simple/concrete linguistic task with 80% accuracy.   Time 8   Period Weeks   Status New   SLP LONG TERM GOAL #2   Title Patient will generate grammatical and cogent sentence to complete abstract/complex linguistic task with 80% accuracy.   Time 8   Period Weeks   Status  New   SLP LONG TERM GOAL #3   Title Patient will complete high level word finding tasks with 80% accuracy.   Time 8   Period Weeks   Status New          Plan - 09/25/15 1336    Clinical Impression Statement Jeremy Harrell continues to make progress with his word finding strategies. He is very good at pantomiming what he means, and needs to be encouraged during ST to use his words. He will benefit from continued skilled intervention to improve his ability to be understood outside the therapy environment.   Speech Therapy Frequency 2x / week   Duration Other (comment)   Treatment/Interventions Language facilitation;Patient/family education;SLP instruction and feedback;Multimodal communcation approach   Potential to  Achieve Goals Good   Potential Considerations Ability to learn/carryover information;Co-morbidities;Cooperation/participation level;Medical prognosis;Previous level of function;Severity of impairments;Family/community support   SLP Home Exercise Plan To be determined   Consulted and Agree with Plan of Care Patient;Family member/caregiver   Family Member Consulted Spouse      Patient will benefit from skilled therapeutic intervention in order to improve the following deficits and impairments:   Aphasia    Problem List There are no active problems to display for this patient.   Leroy Kennedy 09/25/2015, 1:36 PM  [place] MAIN [facility] SERVICES [address] [place], [place], [address] Phone: [phone]   Fax:  [phone]   Name: Jeremy Lupe Sr. MRN: [id number] Date of Birth: 1950-12-30

## 2015-09-25 NOTE — Therapy (Signed)
[place] MAIN [facility] SERVICES [address] [place], [place], [address] Phone: [phone]   Fax:  [phone]  Physical Therapy Treatment  Patient Details  Name: Jeremy Pyatt Sr. MRN: [id number] Date of Birth: [birth year] Referring Provider: Dr. Kerrin Mo  Encounter Date: 09/25/2015      PT End of Session - 09/25/15 1057    Visit Number 11   Number of Visits 17   Date for PT Re-Evaluation 10/02/15   Authorization Type gcode 9   Authorization Time Period 10   PT Start Time 1017   PT Stop Time 1057   PT Time Calculation (min) 40 min   Equipment Utilized During Treatment Gait belt   Activity Tolerance Patient tolerated treatment well   Behavior During Therapy WFL for tasks assessed/performed      Past Medical History  Diagnosis Date  . Arthritis pain   . Polio     age 39    Past Surgical History  Procedure Laterality Date  . Colonoscopy  2013    Dr Vira Agar  . Hernia repair Right 03/28/14    inguinal hernia repair    There were no vitals filed for this visit.      Subjective Assessment - 09/25/15 1024    Subjective (p) Pt reports he is doing well. No new issues since last visit. He reports he feels walking on uneven surfaces is his biggest deficit.    Pertinent History (p) cva 2 months ago   Currently in Pain? (p) Yes   Pain Score (p) 3   LBP   Pain Onset (p) Today      Neuro:  Resisted walking             F/B, B/F, L/R, R/L x2 each 22.5 plate  Cues for increased BOS as needed and weight shift correction to improve balance.  Gait with ball toss up/catch/bounce, side toss on wall, around the waist to challenge dynamic gait/balance and coordination for ~400 ft with min guard. No LOB.  Gait outdoors on uneven surfaces including in grass with min guard. No LOB.  OTIS on Airex 2x30 sec each B LE SLS, most difficulty on L LE Seated theraball               Alt UE/LE x20 each             March x20 each      UE rotation x20 each  Cone taps in SLS without UE support x 5 cones x3 each LE Cone pick ups in SLS without UE support  Cues for postural correction, increased BOS and weight shifting to improve balance. Most difficulty with SLS tasks on R LE.                           PT Education - 09/25/15 1056    Education provided Yes   Education Details continue HEP   Person(s) Educated Patient   Methods Explanation;Demonstration   Comprehension Verbalized understanding;Returned demonstration             PT Long Term Goals - 08/14/15 1636    PT LONG TERM GOAL #1   Title Patient will be independent in home exercise program to improve strength/mobility for better functional independence with ADLs   Time 8   Period Weeks   Status New   PT LONG TERM GOAL #2   Title Patient (> 66 years old) will complete five times  sit to stand test in < 15 seconds indicating an increased LE strength and improved balance.   Time 8   Period Weeks   Status New   PT LONG TERM GOAL #3   Title . Patient will increase 10 meter walk test to >1.54m/s as to improve gait speed for better community ambulation and to reduce fall risk   Time 8   Period Weeks   Status New               Plan - 09/25/15 1058    Clinical Impression Statement Pt gradually progressing towards functional goals. His balance is improving and is able ot perform higher intensity balance activities during this session. Pt has the most difficulty in SLS on the R LE.   Rehab Potential Good   Clinical Impairments Affecting Rehab Potential  impaired standing balance, weakess RLE hip add/extension and PF B ankles   PT Frequency 2x / week   PT Duration 8 weeks   PT Treatment/Interventions Therapeutic activities;Therapeutic exercise;Balance training;Functional mobility training;Gait training;Stair training   PT Next Visit Plan strengthening and balance training   PT Home Exercise Plan SLS in corner   Consulted and  Agree with Plan of Care Patient      Patient will benefit from skilled therapeutic intervention in order to improve the following deficits and impairments:  Abnormal gait, Decreased mobility, Decreased activity tolerance, Decreased endurance, Decreased strength, Decreased balance, Difficulty walking  Visit Diagnosis: Muscle weakness (generalized)  Other lack of coordination  Difficulty in walking, not elsewhere classified     Problem List There are no active problems to display for this patient.   Jeremy Harrell, PT, [name]  09/25/2015, 11:00 AM [place] MAIN [facility] SERVICES [address] [place], [place], [address] Phone: [phone]   Fax:  [phone]  Name: Jeremy [place] Sr. MRN: [id number] Date of Birth: Apr 09, 1950

## 2015-09-26 ENCOUNTER — Encounter: Payer: Self-pay | Admitting: Occupational Therapy

## 2015-09-26 NOTE — Therapy (Signed)
[place] MAIN [facility] SERVICES [address] [place], [place], [address] Phone: [phone]   Fax:  [phone]  Occupational Therapy Treatment  Patient Details  Name: Jeremy Hierholzer Sr. MRN: [id number] Date of Birth: 19-Jul-1950 Referring Provider: Ramonita Lab  Encounter Date: 09/25/2015      OT End of Session - 09/26/15 2004    Visit Number 8   Number of Visits 16   Date for OT Re-Evaluation 10/03/15   Authorization Type Medicare G codes 8   OT Start Time 0930   OT Stop Time 1015   OT Time Calculation (min) 45 min   Activity Tolerance Patient tolerated treatment well   Behavior During Therapy Jeremy Harrell for tasks assessed/performed      Past Medical History  Diagnosis Date  . Arthritis pain   . Polio     age 39    Past Surgical History  Procedure Laterality Date  . Colonoscopy  2013    Dr Vira Agar  . Hernia repair Right 03/28/14    inguinal hernia repair    There were no vitals filed for this visit.      Subjective Assessment - 09/26/15 1959    Subjective  Patient reports he feels he is doing better, still feels some weakness and decreased coordination and especially for handwriting.    Patient is accompained by: Family member   Patient Stated Goals "to use my right hand better especially for writing"   Currently in Pain? No/denies   Pain Score 0-No pain                      OT Treatments/Exercises (OP) - 09/26/15 1959    ADLs   Writing Patient seen this date with focus on handwriting skills combined with formulation of lists to determine legibility.  Completed 3 lists of 10 items which he had to think about and write out.  Each list resulted in 60% legibility overall.  Instructed patient to work on handwriting at home to help with building small muscles of the hand as well as hand endurance.    Fine Motor Coordination   Other Fine Motor Exercises Patient seen for manpulation of small pieces of [place]  pegboard which consisted of small dowel, washer and collar to assemble with right hand, cues for removal to use translatory skills of the hand and using the hand for storage.    Neurological Re-education Exercises   Other Exercises 1 Patient seen for right hand strengthening with green theraputty for grip, lateral pinch, 3 point pinch and 2 point pinch skills with cues for technique.  10 reps each for 2 sets.                 OT Education - 09/26/15 2003    Education provided Yes   Education Details HEP, handwriting skills with formulating list of objects   Person(s) Educated Patient;Spouse   Methods Explanation;Demonstration;Verbal cues   Comprehension Verbal cues required;Returned demonstration;Verbalized understanding             OT Long Term Goals - 08/28/15 1717    OT LONG TERM GOAL #1   Title Patient will improve RUE strength by 1 mm grade to be able to lift items up to 8# without difficulty.     Baseline difficulty with using right hand to lift items such as milk jug.    Time 8   Period Weeks   Status On-going   OT LONG TERM GOAL #2  Title Patient will increase right hand grip strength by 10# to be able to open jars and containers with modified independence.   Baseline unable   Time 8   Period Weeks   Status On-going   OT LONG TERM GOAL #3   Title Patient will improve right hand coordination to write his name and address on important papers with 90% legibility.   Baseline legibility less than 25%.   Time 8   Period Weeks   Status On-going   OT LONG TERM GOAL #4   Title Patient will complete lower body dressing with modified independence including donning socks.   Baseline difficulty with donning socks and needs occasional assist.   Time 8   Period Weeks   Status On-going   OT LONG TERM GOAL #5   Title Patient will perform house hold chores and yard work with supervision.   Baseline unable    Time 8   Period Weeks   Status On-going   OT LONG TERM GOAL  #6   Title Patient will improve fine motor coordination to pick up and manipulate coins to make change independently.   Baseline difficulty with manipulation of small objects.   Time 8   Period Weeks   Status On-going               Plan - 09/26/15 2004    Clinical Impression Statement Patient demonstrated some increased difficulty with formulating lists of common objects which may be related to speech and processing difficulties.  His handwriting remains poor but legibility improving to around 60%.  His speed and dexterity continue to improve for manipulation of small objects. Continue to work towards these skills to increase independence in daily tasks.    Rehab Potential Good   OT Frequency 2x / week   OT Duration 8 weeks   OT Treatment/Interventions Self-care/ADL training;Therapeutic exercise;Patient/family education;Neuromuscular education;Manual Therapy;Balance training;Therapeutic exercises;DME and/or AE instruction;Therapeutic activities;Cognitive remediation/compensation   Consulted and Agree with Plan of Care Patient      Patient will benefit from skilled therapeutic intervention in order to improve the following deficits and impairments:  Decreased activity tolerance, Decreased knowledge of use of DME, Decreased strength, Impaired flexibility, Decreased balance, Decreased cognition, Decreased range of motion, Decreased coordination, Impaired UE functional use, Difficulty walking, Decreased mobility  Visit Diagnosis: Muscle weakness (generalized)  Other lack of coordination    Problem List There are no active problems to display for this patient.  Achilles Dunk, OTR/L, CLT  Lovett,Amy 09/26/2015, 8:07 PM  [place] MAIN [facility] SERVICES [address] [place], [place], [address] Phone: [phone]   Fax:  [phone]  Name: Jeremy Salk Sr. MRN: [id number] Date of Birth: 1951-03-30

## 2015-09-27 ENCOUNTER — Encounter: Payer: Self-pay | Admitting: Occupational Therapy

## 2015-09-27 ENCOUNTER — Ambulatory Visit: Payer: PPO | Admitting: Physical Therapy

## 2015-09-27 ENCOUNTER — Encounter: Payer: Self-pay | Admitting: Speech Pathology

## 2015-09-27 ENCOUNTER — Ambulatory Visit: Payer: PPO | Admitting: Speech Pathology

## 2015-09-27 ENCOUNTER — Encounter: Payer: Self-pay | Admitting: Physical Therapy

## 2015-09-27 DIAGNOSIS — R262 Difficulty in walking, not elsewhere classified: Secondary | ICD-10-CM

## 2015-09-27 DIAGNOSIS — R4701 Aphasia: Secondary | ICD-10-CM | POA: Diagnosis not present

## 2015-09-27 DIAGNOSIS — M6281 Muscle weakness (generalized): Secondary | ICD-10-CM

## 2015-09-27 NOTE — Therapy (Signed)
[place] MAIN [facility] SERVICES [address] [place], [place], [address] Phone: [phone]   Fax:  [phone]  Physical Therapy Treatment  Patient Details  Name: Jeremy Rill Sr. MRN: [id number] Date of Birth: [birth year] Referring Provider: Dr. Kerrin Mo  Encounter Date: 09/27/2015      PT End of Session - 09/27/15 1016    Visit Number 12   Number of Visits 17   Date for PT Re-Evaluation 10/02/15   Authorization Type gcode 9   Authorization Time Period 10   PT Start Time 1000   PT Stop Time 1040   PT Time Calculation (min) 40 min   Equipment Utilized During Treatment Gait belt   Activity Tolerance Patient tolerated treatment well   Behavior During Therapy [facility] for tasks assessed/performed      Past Medical History  Diagnosis Date  . Arthritis pain   . Polio     age 65    Past Surgical History  Procedure Laterality Date  . Colonoscopy  2013    Dr Vira Agar  . Hernia repair Right 03/28/14    inguinal hernia repair    There were no vitals filed for this visit.      Subjective Assessment - 09/27/15 1015    Subjective Patient moved over the weekend and he is sore in his legs and is not having any back pain.   Pertinent History cva 2 months ago   Currently in Pain? No/denies   Pain Onset In the past 7 days      Neuromuscular Re-education   Marching in place on blue foam pad x 30 seconds for 2 sets   Tandem standing in // bars  x 1 minute Step ups to blue foam pad x 10 bilaterally for 2 sets bilaterally   Heel raises bilateral feet  Sit to stand training x5 repetitions for 3 set Matrix x 5 reps fwd/bwd/side stepping Four square stepping.side to side, front and back and diagonals Feet together on blue foam and holding ball x 1 minute, holding ball and fwd and bwd movement elbow flex/ext x 20, horizontal abd/add with ball and arms extended. balloon toss at parallel bars x 5 minutes Toe taps on AIREX + step 3x10 no  Ue Fwd step up onto 6inch step from AIREX no UE Fwd step up onto 4inch step no UE x 10 each side CGA to min A needed cues for wt  CGA needed for theraball balance with pt demonstrating good stability during twisting and single leg standing activities                           PT Education - 09/27/15 1015    Education provided Yes   Education Details safety with stepping up on a curb.   Person(s) Educated Patient   Methods Explanation   Comprehension Verbalized understanding             PT Long Term Goals - 08/14/15 1636    PT LONG TERM GOAL #1   Title Patient will be independent in home exercise program to improve strength/mobility for better functional independence with ADLs   Time 8   Period Weeks   Status New   PT LONG TERM GOAL #2   Title Patient (> 65 years old) will complete five times sit to stand test in < 15 seconds indicating an increased LE strength and improved balance.   Time 8   Period Weeks  Status New   PT LONG TERM GOAL #3   Title . Patient will increase 10 meter walk test to >1.42m/s as to improve gait speed for better community ambulation and to reduce fall risk   Time 8   Period Weeks   Status New               Plan - 09/27/15 1017    Clinical Impression Statement Patient has weakness in RLE ankle and has decreased high level dynamic standing balance. Pt continues to improve in these areas and has no complaints during therapy session.    Rehab Potential Good   Clinical Impairments Affecting Rehab Potential  impaired standing balance, weakess RLE hip add/extension and PF B ankles   PT Frequency 2x / week   PT Duration 8 weeks   PT Treatment/Interventions Therapeutic activities;Therapeutic exercise;Balance training;Functional mobility training;Gait training;Stair training   PT Next Visit Plan strengthening and balance training   PT Home Exercise Plan SLS in corner   Consulted and Agree with Plan of Care Patient       Patient will benefit from skilled therapeutic intervention in order to improve the following deficits and impairments:  Abnormal gait, Decreased mobility, Decreased activity tolerance, Decreased endurance, Decreased strength, Decreased balance, Difficulty walking  Visit Diagnosis: Muscle weakness (generalized)  Difficulty in walking, not elsewhere classified     Problem List There are no active problems to display for this patient.  Alanson Puls, PT, [name] [place], Minette Headland S 09/27/2015, 10:18 AM  [place] MAIN [facility] SERVICES [address] [place], [place], [address] Phone: [phone]   Fax:  [phone]  Name: Jeremy Lipper Sr. MRN: [id number] Date of Birth: Nov 21, 1950

## 2015-09-27 NOTE — Therapy (Signed)
[place] MAIN [facility] SERVICES [address] [place], [place], [address] Phone: [phone]   Fax:  [phone]  Speech Language Pathology Treatment  Patient Details  Name: Jeremy Elm Sr. MRN: [id number] Date of Birth: Jul 29, 1950 Referring Provider: Dr. Caryl Comes  Encounter Date: 09/27/2015      End of Session - 09/27/15 1306    Visit Number 8   Number of Visits 17   Date for SLP Re-Evaluation 10/27/15   SLP Start Time 31   SLP Stop Time  1200   SLP Time Calculation (min) 60 min   Activity Tolerance Patient tolerated treatment well      Past Medical History  Diagnosis Date  . Arthritis pain   . Polio     age 72    Past Surgical History  Procedure Laterality Date  . Colonoscopy  2013    Dr Vira Agar  . Hernia repair Right 03/28/14    inguinal hernia repair    There were no vitals filed for this visit.      Subjective Assessment - 09/27/15 1306    Subjective Jeremy Harrell was alert and engaged throughout the session. He reports that he is able to make himself understood in most situations, but that his words still come out wrong a lot. When asked about his soft voice, he replied that "it will come back when it comes back".   Patient is accompained by: Family member   Currently in Pain? No/denies               ADULT SLP TREATMENT - 09/27/15 0001    General Information   Behavior/Cognition Alert;Cooperative;Pleasant mood   HPI CVA 06/11/2015   Treatment Provided   Treatment provided Cognitive-Linquistic   Pain Assessment   Pain Assessment No/denies pain   Cognitive-Linquistic Treatment   Treatment focused on Aphasia   Skilled Treatment Jeremy Harrell was educated about voice recovery and reminded that it will take effort on his part to restore his voice. HIGH LEVEL WORD FINDING When provided with 5 words, Jeremy Harrell was able to state which word didn't belong and why with 90% accuracy with moderate cues from the  clinician (repetitions, providing first sound). When given 3 words, he was able to determine the object being described with 75% accuracy independently and 100% accuracy with minimal cues from the clinician. During a game of [place], Jeremy Harrell was able to participate fully, but would often wave his hand or mumble to "hold the floor" until he could think of what he wanted to say. He was encouraged during all of these activities to speak at least his one-word answers with more volume than he typically does. He was able to increase his volume a very small amount only when reminded by the clinician.   Assessment / Recommendations / Plan   Plan Continue with current plan of care   Progression Toward Goals   Progression toward goals Progressing toward goals          SLP Education - 09/27/15 1306    Education provided Yes   Education Details Strategies to improve intelligibility   Person(s) Educated Patient   Methods Explanation   Comprehension Verbalized understanding            SLP Long Term Goals - 08/25/15 1337    SLP LONG TERM GOAL #1   Title Patient will generate grammatical and cogent sentence to complete simple/concrete linguistic task with 80% accuracy.   Time 8   Period  Weeks   Status New   SLP LONG TERM GOAL #2   Title Patient will generate grammatical and cogent sentence to complete abstract/complex linguistic task with 80% accuracy.   Time 8   Period Weeks   Status New   SLP LONG TERM GOAL #3   Title Patient will complete high level word finding tasks with 80% accuracy.   Time 8   Period Weeks   Status New          Plan - 09/27/15 1307    Clinical Impression Statement Jeremy Harrell continues to learn more strategies for word-finding including slower rate and pausing to think about what he is going to say. He will benefit from continued skilled intervention to improve his intelligibility at the sentence and conversational level.   Speech Therapy Frequency 2x /  week   Duration Other (comment)   Treatment/Interventions Language facilitation;Patient/family education;SLP instruction and feedback;Multimodal communcation approach   Potential to Achieve Goals Good   Potential Considerations Ability to learn/carryover information;Co-morbidities;Cooperation/participation level;Medical prognosis;Previous level of function;Severity of impairments;Family/community support   SLP Home Exercise Plan To be determined   Consulted and Agree with Plan of Care Patient;Family member/caregiver   Family Member Consulted Spouse      Patient will benefit from skilled therapeutic intervention in order to improve the following deficits and impairments:   Aphasia    Problem List There are no active problems to display for this patient.   Leroy Kennedy 09/27/2015, 1:07 PM  [place] MAIN [facility] SERVICES [address] [place], [place], [address] Phone: [phone]   Fax:  [phone]   Name: Jeremy Topel Sr. MRN: [id number] Date of Birth: 09-27-1950

## 2015-10-02 ENCOUNTER — Ambulatory Visit: Payer: PPO | Admitting: Occupational Therapy

## 2015-10-02 ENCOUNTER — Encounter: Payer: Self-pay | Admitting: Physical Therapy

## 2015-10-02 ENCOUNTER — Encounter: Payer: Self-pay | Admitting: Speech Pathology

## 2015-10-02 ENCOUNTER — Ambulatory Visit: Payer: PPO | Admitting: Speech Pathology

## 2015-10-02 ENCOUNTER — Encounter: Payer: Self-pay | Admitting: Occupational Therapy

## 2015-10-02 ENCOUNTER — Ambulatory Visit: Payer: PPO | Admitting: Physical Therapy

## 2015-10-02 DIAGNOSIS — R4701 Aphasia: Secondary | ICD-10-CM | POA: Diagnosis not present

## 2015-10-02 DIAGNOSIS — M6281 Muscle weakness (generalized): Secondary | ICD-10-CM

## 2015-10-02 DIAGNOSIS — R278 Other lack of coordination: Secondary | ICD-10-CM

## 2015-10-02 DIAGNOSIS — R262 Difficulty in walking, not elsewhere classified: Secondary | ICD-10-CM

## 2015-10-02 NOTE — Therapy (Signed)
[place] MAIN [facility] SERVICES [address] [place], [place], [address] Phone: [phone]   Fax:  [phone]  Physical Therapy Treatment  Patient Details  Name: Jeremy Kucharski Sr. MRN: [id number] Date of Birth: 08-11-1950 Referring Provider: Dr. Kerrin Mo  Encounter Date: 10/02/2015      PT End of Session - 10/02/15 1003    Visit Number 13   Number of Visits 17   Date for PT Re-Evaluation 10/02/15   Authorization Type gcode 9   Authorization Time Period 10   PT Start Time 1015   PT Stop Time 1055   PT Time Calculation (min) 40 min   Equipment Utilized During Treatment Gait belt   Activity Tolerance Patient tolerated treatment well   Behavior During Therapy WFL for tasks assessed/performed      Past Medical History  Diagnosis Date  . Arthritis pain   . Polio     age 65    Past Surgical History  Procedure Laterality Date  . Colonoscopy  2013    Dr Vira Agar  . Hernia repair Right 03/28/14    inguinal hernia repair    There were no vitals filed for this visit.      Subjective Assessment - 10/02/15 1004    Subjective Patient is having pain in his left groin after he begins his walking.    Pertinent History cva 2 months ago   Currently in Pain? Yes   Pain Score 5    Pain Location Hip   Pain Orientation Left   Pain Descriptors / Indicators Aching   Pain Onset In the past 7 days     Gait training with RLE AFO with increased gait speed and some fatigue following ambulation on TM x 10 minutes at 1.5 miles/ hour   and elevation of 1. Gait training without AD  And with R AFO in hall ways with head turns and various speeds 1000 feet. . Patient reports increased LLE hip and back pain due to walking faster today.    PA glides to L1-L5, S1 grade 3 and 4 x 30 bouts, followed by prone extension x 10 and prone extension with over pressure to L4.  Patient respond to manual therapy and has pain change from 5/10 in left groin  to 0/10 following manual therapy.                             PT Education - 10/02/15 1003    Education provided Yes   Education Details goals and safety             PT Long Term Goals - 08/14/15 1636    PT LONG TERM GOAL #1   Title Patient will be independent in home exercise program to improve strength/mobility for better functional independence with ADLs   Time 8   Period Weeks   Status New   PT LONG TERM GOAL #2   Title Patient (> 71 years old) will complete five times sit to stand test in < 15 seconds indicating an increased LE strength and improved balance.   Time 8   Period Weeks   Status New   PT LONG TERM GOAL #3   Title . Patient will increase 10 meter walk test to >1.45m/s as to improve gait speed for better community ambulation and to reduce fall risk   Time 8   Period Weeks   Status New  Plan - 10/02/15 1108    Clinical Impression Statement Patient trys the RLE AFO for walking and feels better with the AFO. He has increased pain in left hip with prolonged walking and he was able to have pain relief from [facility] therapy to lumbar spine.    Rehab Potential Good   Clinical Impairments Affecting Rehab Potential  impaired standing balance, weakess RLE hip add/extension and PF B ankles   PT Frequency 2x / week   PT Duration 8 weeks   PT Treatment/Interventions Therapeutic activities;Therapeutic exercise;Balance training;Functional mobility training;Gait training;Stair training   PT Next Visit Plan strengthening and balance training   PT Home Exercise Plan SLS in corner   Consulted and Agree with Plan of Care Patient      Patient will benefit from skilled therapeutic intervention in order to improve the following deficits and impairments:  Abnormal gait, Decreased mobility, Decreased activity tolerance, Decreased endurance, Decreased strength, Decreased balance, Difficulty walking  Visit Diagnosis: Muscle weakness  (generalized)  Difficulty in walking, not elsewhere classified     Problem List There are no active problems to display for this patient.  Alanson Puls, PT, [name] [place], Minette Headland S 10/02/2015, 11:11 AM  [place] MAIN [facility] SERVICES [address] [place], [place], [address] Phone: [phone]   Fax:  [phone]  Name: Jeremy Hermiller Sr. MRN: [id number] Date of Birth: 06/13/1950

## 2015-10-02 NOTE — Therapy (Signed)
[place] MAIN [facility] SERVICES [address] [place], [place], [address] Phone: [phone]   Fax:  [phone]  Speech Language Pathology Treatment  Patient Details  Name: Jeremy Whidbee Sr. MRN: [id number] Date of Birth: 1950/11/28 Referring Provider: Dr. Caryl Comes  Encounter Date: 10/02/2015      End of Session - 10/02/15 1340    Visit Number 9   Number of Visits 17   Date for SLP Re-Evaluation 10/27/15   SLP Start Time 1103   SLP Stop Time  1157   SLP Time Calculation (min) 54 min   Activity Tolerance Patient tolerated treatment well      Past Medical History  Diagnosis Date  . Arthritis pain   . Polio     age 65    Past Surgical History  Procedure Laterality Date  . Colonoscopy  2013    Dr Vira Agar  . Hernia repair Right 03/28/14    inguinal hernia repair    There were no vitals filed for this visit.      Subjective Assessment - 10/02/15 1339    Subjective The patient agrees that he tends to dismiss word finding problems as "I never knew that"   Patient is accompained by: Family member   Currently in Pain? No/denies               ADULT SLP TREATMENT - 10/02/15 0001    General Information   Behavior/Cognition Alert;Cooperative;Pleasant mood   HPI CVA 06/11/2015   Treatment Provided   Treatment provided Cognitive-Linquistic   Pain Assessment   Pain Assessment No/denies pain   Cognitive-Linquistic Treatment   Treatment focused on Aphasia   Skilled Treatment HIGH LEVEL WORD FINDING:  Complete simple verbal analogies (given choice of 3 words) with 80% accuracy.  Patient not able to explain analogous relationship- responses are lacking information content.  CONNECTED SPEECH: Read sentences aloud, maintaining phonemic accuracy with 80% accuracy.   Assessment / Recommendations / Plan   Plan Continue with current plan of care   Progression Toward Goals   Progression toward goals Progressing toward goals           SLP Education - 10/02/15 1340    Education provided Yes   Education Details Use of content words to express his thoughts   Person(s) Educated Patient;Spouse   Methods Explanation   Comprehension Verbalized understanding            SLP Long Term Goals - 08/25/15 1337    SLP LONG TERM GOAL #1   Title Patient will generate grammatical and cogent sentence to complete simple/concrete linguistic task with 80% accuracy.   Time 8   Period Weeks   Status New   SLP LONG TERM GOAL #2   Title Patient will generate grammatical and cogent sentence to complete abstract/complex linguistic task with 80% accuracy.   Time 8   Period Weeks   Status New   SLP LONG TERM GOAL #3   Title Patient will complete high level word finding tasks with 80% accuracy.   Time 8   Period Weeks   Status New          Plan - 10/02/15 1341    Clinical Impression Statement Mr. Pollan continues to learn more strategies for word-finding including slower rate and pausing to think about what he is going to say. He will benefit from continued skilled intervention to improve his intelligibility at the sentence and conversational level.   Speech Therapy Frequency 2x /  week   Duration Other (comment)   Treatment/Interventions Language facilitation;Patient/family education;SLP instruction and feedback;Multimodal communcation approach   Potential to Achieve Goals Good   Potential Considerations Ability to learn/carryover information;Co-morbidities;Cooperation/participation level;Medical prognosis;Previous level of function;Severity of impairments;Family/community support   Consulted and Agree with Plan of Care Patient;Family member/caregiver   Family Member Consulted Spouse      Patient will benefit from skilled therapeutic intervention in order to improve the following deficits and impairments:   Aphasia    Problem List There are no active problems to display for this patient.  [name], MS/CCC-  SLP   [name] 10/02/2015, 1:42 PM  [place] MAIN [facility] SERVICES [address] [place], [place], [address] Phone: [phone]   Fax:  [phone]   Name: Tajir Nesbitt Sr. MRN: [id number] Date of Birth: Feb 17, 1951

## 2015-10-03 ENCOUNTER — Encounter: Payer: Self-pay | Admitting: Occupational Therapy

## 2015-10-03 NOTE — Therapy (Addendum)
[place] MAIN [facility] SERVICES [address] [place], [place], [address] Phone: [phone]   Fax:  [phone]  Occupational Therapy Treatment/Recertification [id number] to 10-02-2015  Patient Details  Name: Jeremy Lindaman Sr. MRN: [id number] Date of Birth: 07-21-1950 Referring Provider: Ramonita Lab  Encounter Date: 10/02/2015      OT End of Session - 10/02/15 0954    Visit Number 9   Number of Visits 16   Date for OT Re-Evaluation 10/03/15   Authorization Type Medicare G codes 9   OT Start Time 0931   OT Stop Time 1015   OT Time Calculation (min) 44 min   Activity Tolerance Patient tolerated treatment well   Behavior During Therapy [facility] for tasks assessed/performed      Past Medical History  Diagnosis Date  . Arthritis pain   . Polio     age 50    Past Surgical History  Procedure Laterality Date  . Colonoscopy  2013    Dr Vira Agar  . Hernia repair Right 03/28/14    inguinal hernia repair    There were no vitals filed for this visit.      Subjective Assessment - 10/02/15 0953    Subjective  Patient reports he had a quiet weekend.  He reports he can tell he is getting better but has difficulty with naming things he still needs to work on.  He wants to improve his handwriting, tying shoes and making a breakfast meal.     Patient is accompained by: Family member   Patient Stated Goals "to use my right hand better especially for writing"   Currently in Pain? No/denies   Pain Score 0-No pain                      OT Treatments/Exercises (OP) - 10/02/15 1515    ADLs   ADL Comments Patient seen for handwriting tasks for formulation of list, both print and script.  Demos 70% legibility this date, not quite to goal.  Patient continues to demo difficulty with coordination for picking up small objects and especially from the floor, tying shoes, and cooking/meal prep.  Formulation of list for breakfast menu to  possibly perform in the clinic'    Fine Motor Coordination   Other Fine Motor Exercises Patient seen manipulation of coins from tabletop with right hand, picking up, moving though the hand, using the hand for storage and back to fingertips. Cues for prehension patterns and manipulation of coins through the hand.    Neurological Re-education Exercises   Other Exercises 1 Patient seen for reassessment of skills as follows:  Grip strength right 65#, left 62#.  Pinch lateral right 14#, left 13#.  3 point pinch, right 16#, left 14#.  9 hole peg test right 34 secs, left 22 secs.  ROM bilateral shoulder flexion to 150 degrees, all other WFLs.  Strength 4+/5 overall.  Goals reviewed and updated to reflect progress.                 OT Education - 10/02/15 0954    Education provided Yes   Education Details Goals, HEP, coordination and manipulation skills.    Person(s) Educated Patient;Spouse   Methods Explanation;Demonstration;Verbal cues   Comprehension Verbal cues required;Returned demonstration;Verbalized understanding             OT Long Term Goals - 10/02/15 0935    OT LONG TERM GOAL #1   Title Patient will improve RUE strength  by 1 mm grade to be able to lift items up to 8# without difficulty.     Baseline difficulty with using right hand to lift items such as milk jug.    Time 8   Period Weeks   Status Achieved   OT LONG TERM GOAL #2   Title Patient will increase right hand grip strength by 10# to be able to open jars and containers with modified independence.   Baseline unable   Time 8   Period Weeks   Status Achieved   OT LONG TERM GOAL #3   Title Patient will improve right hand coordination to write his name and address on important papers with 90% legibility.   Baseline legibility less than 25%.   Time 8   Period Weeks   Status On-going   OT LONG TERM GOAL #4   Title Patient will complete lower body dressing with modified independence including donning socks.    Baseline difficulty with donning socks and needs occasional assist.   Time 8   Period Weeks   Status Partially Met   OT LONG TERM GOAL #5   Title Patient will perform house hold chores and yard work with supervision.   Baseline moved so he has no yard, light housekeeping    Time 8   Period Weeks   Status Achieved   Long Term Additional Goals   Additional Long Term Goals Yes   OT LONG TERM GOAL #6   Title Patient will improve fine motor coordination to pick up and manipulate coins to make change independently.   Time 8   Period Weeks   Status Achieved   OT LONG TERM GOAL #7   Title Patient will complete breakfast meal including cooking on stovetop with supervision only.   Baseline unable   Time 8   Period Weeks   Status New               Plan - 10/02/15 0957    Clinical Impression Statement Patient has made good progress towards goals, meeting several and a few still ongoing.  He has improved with speed and dexterity of the right hand, completion of basic self care tasks and performing some light household tasks.  He continues to demonstrate difficulty with coordination skills especially to pick up small objects from the floor when dropped., he has difficulty with tying shoes and cooking/meal preparation.  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  Patient would benefit from continued skilled OT to further address these issues.     Rehab Potential Good   OT Frequency 2x / week   OT Duration 8 weeks   OT Treatment/Interventions Self-care/ADL training;Therapeutic exercise;Patient/family education;Neuromuscular education;Manual Therapy;Balance training;Therapeutic exercises;DME and/or AE instruction;Therapeutic activities;Cognitive remediation/compensation   Consulted and Agree with Plan of Care Patient      Patient will benefit from skilled therapeutic intervention in  order to improve the following deficits and impairments:  Decreased activity tolerance, Decreased knowledge of use of DME, Decreased strength, Impaired flexibility, Decreased balance, Decreased cognition, Decreased range of motion, Decreased coordination, Impaired UE functional use, Difficulty walking, Decreased mobility  Visit Diagnosis: Muscle weakness (generalized) - Plan: Ot plan of care cert/re-cert  Other lack of coordination - Plan: Ot plan of care cert/re-cert      G-Codes - 44/31/54 1526    Functional Assessment Tool Used --   Functional Limitation --  Self Care Current Status [phone]) --   Self Care Goal Status ([id number]) --      Problem List There are no active problems to display for this patient.  Achilles Dunk, OTR/L, CLT  Lovett,Amy 10/03/2015, 3:30 PM  [place] MAIN [facility] SERVICES [address] [place], [place], [address] Phone: [phone]   Fax:  [phone]  Name: Jeremy Ferrando Sr. MRN: [id number] Date of Birth: 20-Nov-1950

## 2015-10-04 ENCOUNTER — Ambulatory Visit: Payer: PPO | Admitting: Speech Pathology

## 2015-10-04 ENCOUNTER — Encounter: Payer: PPO | Admitting: Occupational Therapy

## 2015-10-04 ENCOUNTER — Ambulatory Visit: Payer: PPO | Admitting: Physical Therapy

## 2015-10-04 ENCOUNTER — Encounter: Payer: Self-pay | Admitting: Speech Pathology

## 2015-10-04 ENCOUNTER — Encounter: Payer: Self-pay | Admitting: Physical Therapy

## 2015-10-04 DIAGNOSIS — R4701 Aphasia: Secondary | ICD-10-CM | POA: Diagnosis not present

## 2015-10-04 DIAGNOSIS — R262 Difficulty in walking, not elsewhere classified: Secondary | ICD-10-CM

## 2015-10-04 DIAGNOSIS — M6281 Muscle weakness (generalized): Secondary | ICD-10-CM

## 2015-10-04 NOTE — Therapy (Signed)
[place] MAIN [facility] SERVICES [address] [place], [place], [address] Phone: [phone]   Fax:  [phone]  Physical Therapy Treatment  Patient Details  Name: Jeremy Kapala Sr. MRN: [id number] Date of Birth: [birth year] Referring Provider: Dr. Kerrin Mo  Encounter Date: 10/04/2015      PT End of Session - 10/04/15 1029    Visit Number 14   Number of Visits 17   Date for PT Re-Evaluation 10/02/15   Authorization Type gcode 9   Authorization Time Period 10   PT Start Time 1015   PT Stop Time 1100   PT Time Calculation (min) 45 min   Equipment Utilized During Treatment Gait belt   Activity Tolerance Patient tolerated treatment well   Behavior During Therapy [facility] for tasks assessed/performed      Past Medical History  Diagnosis Date  . Arthritis pain   . Polio     age 65    Past Surgical History  Procedure Laterality Date  . Colonoscopy  2013    Dr Vira Agar  . Hernia repair Right 03/28/14    inguinal hernia repair    There were no vitals filed for this visit.      Subjective Assessment - 10/04/15 1026    Subjective Patient is not having any pain today.    Pertinent History cva 2 months ago   Currently in Pain? No/denies   Pain Onset In the past 7 days       Gait training with TM at 1. 5 miles per hour and R AFO with some shuffling and catching at faster speeds Ascending/ descending steps x 3 with hand rails and R AFO Side stepping on TM at . 4 miles / hour left and right x 5 minutes Step ups on curbs with single UE assist Patient has increased pain to left groin and left knee during TM side stepping and it gets somewhat better with seated LE exercises.  Therapeutic exercise;  Leg press x 20 lbs x 3 sets  Leg press single leg RLE x 20 x 3 sets Prone lumbar extension x 10 x 2 (Patient needs cuing to leave his hips on the mat)  Patient had a hernia repair in 2015 and his left groin  Is aggravated with prone  extension. Patient had a his toes catch on the TM 30 times the first 5 minutes and 69 times the second 5 minutes. Patient and wife were educated about using the AFO to prevent a fall.                            PT Education - 10/04/15 1028    Education provided Yes   Education Details use of safe shoes   Person(s) Educated Patient   Methods Explanation   Comprehension Verbalized understanding             PT Long Term Goals - 08/14/15 1636    PT LONG TERM GOAL #1   Title Patient will be independent in home exercise program to improve strength/mobility for better functional independence with ADLs   Time 8   Period Weeks   Status New   PT LONG TERM GOAL #2   Title Patient (> 97 years old) will complete five times sit to stand test in < 15 seconds indicating an increased LE strength and improved balance.   Time 8   Period Weeks   Status New   PT  LONG TERM GOAL #3   Title . Patient will increase 10 meter walk test to >1.66m/s as to improve gait speed for better community ambulation and to reduce fall risk   Time 8   Period Weeks   Status New               Plan - 10/04/15 1029    Clinical Impression Statement Patient brings better lace up shoes for the AFO trial today during walking.    Rehab Potential Good   Clinical Impairments Affecting Rehab Potential  impaired standing balance, weakess RLE hip add/extension and PF B ankles   PT Frequency 2x / week   PT Duration 8 weeks   PT Treatment/Interventions Therapeutic activities;Therapeutic exercise;Balance training;Functional mobility training;Gait training;Stair training   PT Next Visit Plan strengthening and balance training   PT Home Exercise Plan SLS in corner   Consulted and Agree with Plan of Care Patient      Patient will benefit from skilled therapeutic intervention in order to improve the following deficits and impairments:  Abnormal gait, Decreased mobility, Decreased activity tolerance,  Decreased endurance, Decreased strength, Decreased balance, Difficulty walking  Visit Diagnosis: Muscle weakness (generalized)  Difficulty in walking, not elsewhere classified     Problem List There are no active problems to display for this patient. Alanson Puls, PT, [name]  [place], Minette Headland S 10/04/2015, 10:31 AM  [place] MAIN [facility] SERVICES [address] [place], [place], [address] Phone: [phone]   Fax:  [phone]  Name: Jeremy Yap Sr. MRN: [id number] Date of Birth: 1951/01/29

## 2015-10-04 NOTE — Therapy (Signed)
[place] [facility] SERVICES [address] [place], [place], [address] Phone: [phone]   Fax:  [phone]  Speech Language Pathology Treatment/Progress Note  Patient Details  Name: Jeremy Guggenheim Sr. MRN: [id number] Date of Birth: 10-24-1950 Referring Provider: Dr. Caryl Comes  Encounter Date: 10/04/2015      End of Session - 10/04/15 1257    Visit Number 10   Number of Visits 17   Date for SLP Re-Evaluation 10/27/15   SLP Start Time 37   SLP Stop Time  1200   SLP Time Calculation (min) 60 min   Activity Tolerance Patient tolerated treatment well      Past Medical History  Diagnosis Date  . Arthritis pain   . Polio     age 65    Past Surgical History  Procedure Laterality Date  . Colonoscopy  2013    Dr Vira Agar  . Hernia repair Right 03/28/14    inguinal hernia repair    There were no vitals filed for this visit.      Subjective Assessment - 10/04/15 1256    Subjective [name] was alert and engaged throughout the session.  He feels that his speech is improving and that he can communicate everything that he wishes. His wife would like to see continued progress, especially with complex speech.   Patient is accompained by: Family member   Currently in Pain? No/denies               ADULT SLP TREATMENT - 10/04/15 0001    General Information   Behavior/Cognition Alert;Cooperative;Pleasant mood   HPI CVA 06/11/2015   Treatment Provided   Treatment provided Cognitive-Linquistic   Pain Assessment   Pain Assessment No/denies pain   Cognitive-Linquistic Treatment   Treatment focused on Aphasia   Skilled Treatment HIGH LEVEL WORD FINDING The idea of semantic mapping was explained and completed for several words with clinician guidance. [name] was able to complete simple analogies with 85% accuracy with moderate cues from the clinician. He was unable to explain his reasoning except with maximum cues from the  clinician. He was able to read aloud functional texts (bills, parking tickets, ads) and answer basic comprehension questions with 100% accuracy with minimal cues from the clinician (asking him to check answer).   Assessment / Recommendations / Plan   Plan Continue with current plan of care   Progression Toward Goals   Progression toward goals Progressing toward goals          SLP Education - 10/04/15 1256    Education provided Yes   Education Details Word Finding strategies   Person(s) Educated Patient;Spouse   Methods Explanation   Comprehension Verbalized understanding            SLP Long Term Goals - 10/04/15 1421    SLP LONG TERM GOAL #1   Title Patient will generate grammatical and cogent sentence to complete simple/concrete linguistic task with 80% accuracy.   Time 8   Period Weeks   Status Partially Met   SLP LONG TERM GOAL #2   Title Patient will generate grammatical and cogent sentence to complete abstract/complex linguistic task with 80% accuracy.   Time 8   Period Weeks   Status On-going   SLP LONG TERM GOAL #3   Title Patient will complete high level word finding tasks with 80% accuracy.   Time 8   Period Weeks   Status Partially Met  Plan - Oct 31, 2015 1257    Clinical Impression Statement [name] continues to learn new strategies to aid with his word finding. He experiences frequent semantic and phonemic paraphasias. It is difficult to differentiate between his semantic paraphasias and his habit of saying similar but not synonymous words when he is stuck. His intelligibility is affected by both his word finding issues and his low volume. Mr. Kings will benefit from continued skilled intervention to develop more word finding strategies and to address his quiet voice.   Speech Therapy Frequency 2x / week   Duration Other (comment)   Treatment/Interventions Language facilitation;Patient/family education;SLP instruction and feedback;Multimodal  communcation approach   Potential to Achieve Goals Good   Potential Considerations Ability to learn/carryover information;Co-morbidities;Cooperation/participation level;Medical prognosis;Previous level of function;Severity of impairments;Family/community support   SLP Home Exercise Plan To be determined   Consulted and Agree with Plan of Care Patient;Family member/caregiver   Family Member Consulted Spouse      Patient will benefit from skilled therapeutic intervention in order to improve the following deficits and impairments:   Aphasia      G-Codes - Oct 31, 2015 1422    Functional Assessment Tool Used Clinical judgment   Functional Limitations Spoken language expressive   Spoken Language Expression Current Status [phone]) At least 20 percent but less than 40 percent impaired, limited or restricted   Spoken Language Expression Goal Status (V1427) At least 1 percent but less than 20 percent impaired, limited or restricted      Problem List There are no active problems to display for this patient.  [name], MS/CCC- SLP  [name] October 31, 2015, 2:22 PM  [place] [facility] SERVICES [address] [place], [place], [address] Phone: [phone]   Fax:  [phone]   Name: Jeremy Hemmelgarn Sr. MRN: [id number] Date of Birth: 02/14/1951

## 2015-10-09 ENCOUNTER — Ambulatory Visit: Payer: PPO | Attending: Internal Medicine | Admitting: Speech Pathology

## 2015-10-09 ENCOUNTER — Encounter: Payer: Self-pay | Admitting: Speech Pathology

## 2015-10-09 DIAGNOSIS — R262 Difficulty in walking, not elsewhere classified: Secondary | ICD-10-CM | POA: Diagnosis not present

## 2015-10-09 DIAGNOSIS — M6281 Muscle weakness (generalized): Secondary | ICD-10-CM | POA: Diagnosis not present

## 2015-10-09 DIAGNOSIS — R4701 Aphasia: Secondary | ICD-10-CM | POA: Diagnosis not present

## 2015-10-09 DIAGNOSIS — R278 Other lack of coordination: Secondary | ICD-10-CM | POA: Diagnosis not present

## 2015-10-09 NOTE — Therapy (Signed)
[place] MAIN [facility] SERVICES [address] [place], [place], [address] Phone: [phone]   Fax:  [phone]  Speech Language Pathology Treatment  Patient Details  Name: Jeremy Macek Sr. MRN: [id number] Date of Birth: [birth year] Referring Provider: Dr. Caryl Comes  Encounter Date: 10/09/2015      End of Session - 10/09/15 1115    Visit Number 11   Number of Visits 17   Date for SLP Re-Evaluation 10/27/15   SLP Start Time 0850   SLP Stop Time  0950   SLP Time Calculation (min) 60 min   Activity Tolerance Patient tolerated treatment well      Past Medical History  Diagnosis Date  . Arthritis pain   . Polio     age 65    Past Surgical History  Procedure Laterality Date  . Colonoscopy  2013    Dr Vira Agar  . Hernia repair Right 03/28/14    inguinal hernia repair    There were no vitals filed for this visit.      Subjective Assessment - 10/09/15 1114    Subjective Jeremy Harrell was alert and engaged throughout the session. He feels that his communication is sufficient for the activities he wants to be engaging in.   Currently in Pain? No/denies               ADULT SLP TREATMENT - 10/09/15 0001    General Information   Behavior/Cognition Alert;Cooperative;Pleasant mood   HPI CVA 06/11/2015   Treatment Provided   Treatment provided Cognitive-Linquistic   Pain Assessment   Pain Assessment No/denies pain   Cognitive-Linquistic Treatment   Treatment focused on Aphasia   Skilled Treatment HIGH LEVEL WORD FINDING: When provided with 2 unrelated words, Jeremy Harrell was able to generate cogent, grammatically correct sentences including those words 75% of the time independently and 90% of the time with moderate cues (repetitions, modelling). He was able to complete basic analogies with 90% accuracy with moderate cues (defining relationship). He completed functional reading tasks and answered comprehension questions with 75%  accuracy with the text as a reference.   Assessment / Recommendations / Plan   Plan Continue with current plan of care   Progression Toward Goals   Progression toward goals Progressing toward goals          SLP Education - 10/09/15 1114    Education provided Yes   Education Details word finding strategies   Person(s) Educated Patient   Methods Explanation   Comprehension Verbalized understanding            SLP Long Term Goals - 10/04/15 1421    SLP LONG TERM GOAL #1   Title Patient will generate grammatical and cogent sentence to complete simple/concrete linguistic task with 80% accuracy.   Time 8   Period Weeks   Status Partially Met   SLP LONG TERM GOAL #2   Title Patient will generate grammatical and cogent sentence to complete abstract/complex linguistic task with 80% accuracy.   Time 8   Period Weeks   Status On-going   SLP LONG TERM GOAL #3   Title Patient will complete high level word finding tasks with 80% accuracy.   Time 8   Period Weeks   Status Partially Met          Plan - 10/09/15 1115    Clinical Impression Statement Jeremy Harrell continues to make progress with his word finding abilities. He will benefit from continued skilled intervention to provide  him with functional strategies he can use when unable to find a word.   Speech Therapy Frequency 2x / week   Duration Other (comment)   Treatment/Interventions Language facilitation;Patient/family education;SLP instruction and feedback;Multimodal communcation approach   Potential to Achieve Goals Good   Potential Considerations Ability to learn/carryover information;Co-morbidities;Cooperation/participation level;Medical prognosis;Previous level of function;Severity of impairments;Family/community support   SLP Home Exercise Plan To be determined   Consulted and Agree with Plan of Care Patient;Family member/caregiver   Family Member Consulted Spouse      Patient will benefit from skilled therapeutic  intervention in order to improve the following deficits and impairments:   Aphasia    Problem List There are no active problems to display for this patient.   Leroy Kennedy 10/09/2015, 11:15 AM  [place] MAIN [facility] SERVICES [address] [place], [place], [address] Phone: [phone]   Fax:  [phone]   Name: Jeremy Corallo Sr. MRN: [id number] Date of Birth: 12-01-1950

## 2015-10-12 ENCOUNTER — Ambulatory Visit: Payer: PPO | Admitting: Occupational Therapy

## 2015-10-12 ENCOUNTER — Ambulatory Visit: Payer: PPO | Admitting: Physical Therapy

## 2015-10-12 ENCOUNTER — Ambulatory Visit: Payer: PPO | Admitting: Speech Pathology

## 2015-10-12 ENCOUNTER — Encounter: Payer: Self-pay | Admitting: Physical Therapy

## 2015-10-12 DIAGNOSIS — M6281 Muscle weakness (generalized): Secondary | ICD-10-CM

## 2015-10-12 DIAGNOSIS — R278 Other lack of coordination: Secondary | ICD-10-CM

## 2015-10-12 DIAGNOSIS — R4701 Aphasia: Secondary | ICD-10-CM | POA: Diagnosis not present

## 2015-10-12 DIAGNOSIS — R262 Difficulty in walking, not elsewhere classified: Secondary | ICD-10-CM

## 2015-10-12 NOTE — Therapy (Signed)
[place] MAIN [facility] SERVICES [address] [place], [place], [address] Phone: [phone]   Fax:  [phone]  Physical Therapy Treatment  Patient Details  Name: Jeremy Miyahira Sr. MRN: [id number] Date of Birth: 04-06-1951 Referring Provider: Dr. Kerrin Mo  Encounter Date: 10/12/2015      PT End of Session - 10/12/15 1319    Visit Number 15   Number of Visits 17   Date for PT Re-Evaluation 10/02/15   Authorization Type gcode 9   Authorization Time Period 10   PT Start Time 0110   PT Stop Time 0150   PT Time Calculation (min) 40 min   Equipment Utilized During Treatment Gait belt   Activity Tolerance Patient tolerated treatment well;No increased pain   Behavior During Therapy [facility] for tasks assessed/performed      Past Medical History  Diagnosis Date  . Arthritis pain   . Polio     age 65    Past Surgical History  Procedure Laterality Date  . Colonoscopy  2013    Dr Vira Agar  . Hernia repair Right 03/28/14    inguinal hernia repair    There were no vitals filed for this visit.      Subjective Assessment - 10/12/15 1318    Subjective Patient is not having  back pain today.     Pertinent History cva 2 months ago   Currently in Pain? No/denies   Pain Onset In the past 7 days      THER-EX Standing exercises with RTB BLE : Marching 2 x 10; SLR 2 x 10; Abduction 2 x 10; Extension 2 x 10;; Heel raises 2 x 10; Eccentric step downs x 10 BLE Squats x 10 with 5 sec hold Heel raises x 10 x 2  Resisted side-steeping RTB 4 lengths x 2; Standing mini squats 2 x 10  Sit to stand without UE support 2 x 10; Step-ups to 6" step x 10 bilateral; Quantum leg press 105# x 10, 120# x 10;  NEUROMUSCULAR RE-EDUCATION Airex NBOS eyes open/closed x 30 seconds each; Airex NBOS eyes open horizontal and vertical head turns x 30 seconds; Toe tapping 6 inch stool without UE assist Tandem gait in // bars x 4 laps  Side stepping on  blue  foam balance beam x 5 lengths of the parallel bars  Min cueing needed to appropriately perform tasks with leg, hand, and head position. Decreased coordination demonstrated requiring consistent verbal cueing to correct form.. Patient continues to demonstrate some in coordination of movement with select exercises such as stepping backwards. Patient responds well to verbal and tactile cues to correct form and technique.  CGA to SBA for safety with activities.                            PT Education - 10/12/15 1319    Education provided Yes   Education Details safety with mobility   Person(s) Educated Patient   Methods Explanation   Comprehension Verbalized understanding             PT Long Term Goals - 08/14/15 1636    PT LONG TERM GOAL #1   Title Patient will be independent in home exercise program to improve strength/mobility for better functional independence with ADLs   Time 8   Period Weeks   Status New   PT LONG TERM GOAL #2   Title Patient (> 75 years old) will complete  five times sit to stand test in < 15 seconds indicating an increased LE strength and improved balance.   Time 8   Period Weeks   Status New   PT LONG TERM GOAL #3   Title . Patient will increase 10 meter walk test to >1.65m/s as to improve gait speed for better community ambulation and to reduce fall risk   Time 8   Period Weeks   Status New               Plan - 10/12/15 1320    Clinical Impression Statement CGA needed throughout with an increase in postural sway while on BOSU ball. Verbal cues needed to keep alternating pattern with steps,   Rehab Potential Good   Clinical Impairments Affecting Rehab Potential  impaired standing balance, weakess RLE hip add/extension and PF B ankles   PT Frequency 2x / week   PT Duration 8 weeks   PT Treatment/Interventions Therapeutic activities;Therapeutic exercise;Balance training;Functional mobility training;Gait training;Stair  training   PT Next Visit Plan strengthening and balance training   PT Home Exercise Plan SLS in corner   Consulted and Agree with Plan of Care Patient      Patient will benefit from skilled therapeutic intervention in order to improve the following deficits and impairments:  Abnormal gait, Decreased mobility, Decreased activity tolerance, Decreased endurance, Decreased strength, Decreased balance, Difficulty walking  Visit Diagnosis: Muscle weakness (generalized)  Difficulty in walking, not elsewhere classified     Problem List There are no active problems to display for this patient.  Alanson Puls, PT, [name] [place], [place] S 10/12/2015, 1:22 PM  [place] MAIN [facility] SERVICES [address] [place], [place], [address] Phone: [phone]   Fax:  [phone]  Name: Humzah Filla Sr. MRN: [id number] Date of Birth: [birth year]

## 2015-10-12 NOTE — Therapy (Signed)
[place] MAIN [facility] SERVICES [address] [place], [place], [address] Phone: [phone]   Fax:  [phone]  Occupational Therapy Treatment  Patient Details  Name: Jeremy Zaldivar Sr. MRN: [id number] Date of Birth: July 11, 1950 Referring Provider: Ramonita Lab  Encounter Date: 10/12/2015      OT End of Session - 10/12/15 1649    Visit Number 10   Number of Visits 16   Date for OT Re-Evaluation 10/03/15   Authorization Type Medicare G codes 10   OT Start Time 1500   OT Stop Time 1545   OT Time Calculation (min) 45 min   Activity Tolerance Patient tolerated treatment well   Behavior During Therapy [facility] for tasks assessed/performed      Past Medical History  Diagnosis Date  . Arthritis pain   . Polio     age 65    Past Surgical History  Procedure Laterality Date  . Colonoscopy  2013    Dr Vira Agar  . Hernia repair Right 03/28/14    inguinal hernia repair    There were no vitals filed for this visit.      Subjective Assessment - 10/12/15 1528    Subjective  Pt. and wife report that they can see the progress he is making.   Patient is accompained by: Family member   Patient Stated Goals "to use my right hand better especially for writing"   Currently in Pain? No/denies        OT TREATMENT    Neuro muscular re-education:  Pt. Worked on improving and refining [facility] skills. Pt. Worked on grasping  and placing 1/4" pegs with his thumb and 2nd digit while storing pegs in the palm of his hand. Pt. Worked on removing pegs with alternating thumb opposition to the 2nd through 5th digits. Pt. Worked on grasping 1/8" objects and weaving them them onto a small stick. Pt. Was able to grasp and hold the minute objects. Pt. worked on grasping and placing mini clips at a vertical angle. Pt. Was able to stabilize and steady the clips. Pt. worked on tasks to sustain lateral pinch on resistive tweezers while grasping and moving 2" toothpick  sticks from a horizontal flat position to a vertical position in order to place it in the holder. Pt. was able to sustain grasp while positioning and extending the wrist/hand in the necessary alignment needed to place the stick through the top of the holder.   Therapeutic Exercise:   Pt. performed gross gripping with grip strengthener. Pt. worked on sustaining grip while grasping pegs and reaching at various heights. Pt. Worked with the gripper on the 3rd resistive slot.   Self-care:  Pt. Worked on Sales executive. Pt. Does require continued work on improving legibility of each. Pt. Was however, presented with adequate spacing, and very little deviation from the written text line.                         OT Education - 10/12/15 1644    Education provided Yes   Person(s) Educated Patient   Methods Explanation   Comprehension Verbalized understanding             OT Long Term Goals - 10/12/15 1707    OT LONG TERM GOAL #1   Title Patient will improve RUE strength by 1 mm grade to be able to lift items up to 8# without difficulty.     Baseline difficulty  with using right hand to lift items such as milk jug.    Time 8   Period Weeks   Status Achieved   OT LONG TERM GOAL #2   Title Patient will increase right hand grip strength by 10# to be able to open jars and containers with modified independence.   Baseline unable   Time 8   Period Weeks   Status Achieved   OT LONG TERM GOAL #3   Title Patient will improve right hand coordination to write his name and address on important papers with 90% legibility.   Baseline legibility less than 25%.   Time 8   Period Weeks   Status On-going   OT LONG TERM GOAL #4   Title Patient will complete lower body dressing with modified independence including donning socks.   Baseline difficulty with donning socks and needs occasional assist.   Time 8   Period Weeks   Status Partially Met   OT LONG  TERM GOAL #5   Title Patient will perform house hold chores and yard work with supervision.   Baseline moved so he has no yard, light housekeeping    Time 8   Period Weeks   Status Achieved   OT LONG TERM GOAL #6   Title Patient will improve fine motor coordination to pick up and manipulate coins to make change independently.   Time 8   Period Weeks   Status Achieved   OT LONG TERM GOAL #7   Title Patient will complete breakfast meal including cooking on stovetop with supervision only.   Baseline unable   Time 8   Period Weeks   Status New               Plan - 10/24/2015 1652    Clinical Impression Statement Pt. has made made progress with his [facility] skills. Pt. was able to grasp and manipulate 1/8" objects in preparation for use during ADLs. Pt. continues to benefit from skilled OT services to work on improving [facility] skills, and writing. Pt. presents with decreased legibility for printing, and handwriting. However, spacing was adequate, and pt. had very little deviation from the written text line   Rehab Potential Good   OT Frequency 2x / week   OT Duration 8 weeks   OT Treatment/Interventions Self-care/ADL training;Therapeutic exercise;Patient/family education;Neuromuscular education;Manual Therapy;Balance training;Therapeutic exercises;DME and/or AE instruction;Therapeutic activities;Cognitive remediation/compensation   Consulted and Agree with Plan of Care Patient      Patient will benefit from skilled therapeutic intervention in order to improve the following deficits and impairments:  Decreased activity tolerance, Decreased knowledge of use of DME, Decreased strength, Impaired flexibility, Decreased balance, Decreased cognition, Decreased range of motion, Decreased coordination, Impaired UE functional use, Difficulty walking, Decreased mobility  Visit Diagnosis: Other lack of coordination  Muscle weakness (generalized)      G-Codes - 10/24/2015 1704    Functional  Assessment Tool Used clinical judgment based on pt. current functional level.   Functional Limitation Self care   Self Care Current Status [phone]) At least 20 percent but less than 40 percent impaired, limited or restricted   Self Care Goal Status ([id number]) At least 1 percent but less than 20 percent impaired, limited or restricted      Problem List There are no active problems to display for this patient.  Harrel Carina, MS, OTR/L  Harrel Carina 10/24/2015, 5:25 PM  [place] MAIN [facility] SERVICES [address] [place], [place], [address] Phone: [phone]  Fax:  [phone]  Name: Merl Bommarito Sr. MRN: [id number] Date of Birth: [birth year]

## 2015-10-12 NOTE — Patient Instructions (Addendum)
OT TREATMENT    Neuro muscular re-education:  Pt. Worked on improving and refining [facility] skills. Pt. Worked on grasping  and placing 1/4" pegs with his thumb and 2nd digit while storing pegs in the palm of his hand. Pt. Worked on removing pegs with alternating thumb opposition to the 2nd through 5th digits. Pt. Worked on grasping 1/8" objects and weaving them them onto a small stick. Pt. Was able to grasp and hold the minute objects. Pt. worked on grasping and placing mini clips at a vertical angle. Pt. Was able to stabilize and steady the clips. Pt. worked on tasks to sustain lateral pinch on resistive tweezers while grasping and moving 2" toothpick sticks from a horizontal flat position to a vertical position in order to place it in the holder. Pt. was able to sustain grasp while positioning and extending the wrist/hand in the necessary alignment needed to place the stick through the top of the holder.   Therapeutic Exercise:   Pt. performed gross gripping with grip strengthener. Pt. worked on sustaining grip while grasping pegs and reaching at various heights. Pt. Worked with the gripper on the 3rd resistive slot.   Self-care:  Pt. Worked on Sales executive. Pt. Does require continued work on improving legibility of each. Pt. Was however, presented with adequate spacing, and very little deviation from the written text line.

## 2015-10-13 ENCOUNTER — Encounter: Payer: Self-pay | Admitting: Speech Pathology

## 2015-10-13 NOTE — Therapy (Signed)
[place] MAIN [facility] SERVICES [address] [place], [place], [address] Phone: [phone]   Fax:  [phone]  Speech Language Pathology Treatment  Patient Details  Name: Kaston Faughn Sr. MRN: [id number] Date of Birth: Dec 06, 1950 Referring Provider: Dr. Caryl Comes  Encounter Date: 10/12/2015      End of Session - 10/13/15 1015    Visit Number 12   Number of Visits 17   Date for SLP Re-Evaluation 10/27/15   SLP Start Time 19   SLP Stop Time  1500   SLP Time Calculation (min) 60 min   Activity Tolerance Patient tolerated treatment well      Past Medical History  Diagnosis Date  . Arthritis pain   . Polio     age 40    Past Surgical History  Procedure Laterality Date  . Colonoscopy  2013    Dr Vira Agar  . Hernia repair Right 03/28/14    inguinal hernia repair    There were no vitals filed for this visit.      Subjective Assessment - 10/13/15 1015    Subjective Mr. Mortellaro was alert and engaged throughout the session. He believes his language has improved greatly. Regarding the low volume of his voice, he feels that it will come back when it's ready.   Patient is accompained by: Family member   Currently in Pain? No/denies               ADULT SLP TREATMENT - 10/13/15 0001    General Information   Behavior/Cognition Alert;Cooperative;Pleasant mood   HPI CVA 06/11/2015   Treatment Provided   Treatment provided Cognitive-Linquistic   Pain Assessment   Pain Assessment No/denies pain   Cognitive-Linquistic Treatment   Treatment focused on Aphasia   Skilled Treatment Mr. Gauthreaux was able to complete basic analogies with 85% accuracy given min cues and explain the relationship of the words with 60% accuracy with mod cues. When presented with picture stimuli, he was able to generate a grammatical, well-articulated sentence describing the picture with 95% accuracy with min cues.   Assessment / Recommendations / Plan    Plan Continue with current plan of care   Progression Toward Goals   Progression toward goals Progressing toward goals          SLP Education - 10/13/15 1015    Education provided Yes   Education Details Breath support to increase vocal volume and quality   Person(s) Educated Patient   Methods Explanation   Comprehension Verbalized understanding            SLP Long Term Goals - 10/04/15 1421    SLP LONG TERM GOAL #1   Title Patient will generate grammatical and cogent sentence to complete simple/concrete linguistic task with 80% accuracy.   Time 8   Period Weeks   Status Partially Met   SLP LONG TERM GOAL #2   Title Patient will generate grammatical and cogent sentence to complete abstract/complex linguistic task with 80% accuracy.   Time 8   Period Weeks   Status On-going   SLP LONG TERM GOAL #3   Title Patient will complete high level word finding tasks with 80% accuracy.   Time 8   Period Weeks   Status Partially Met          Plan - 10/13/15 1016    Clinical Impression Statement Mr. Raffo' word finding ability continues to improve. His greatest barrier to being understood at this point is his low  volume during conversation as well as his tendency towards poor articulation. Both of these, as well as advanced word finding, will be addressed in continued skilled intervention.   Speech Therapy Frequency 2x / week   Duration Other (comment)   Treatment/Interventions Language facilitation;Patient/family education;SLP instruction and feedback;Multimodal communcation approach   Potential to Achieve Goals Good   Potential Considerations Ability to learn/carryover information;Co-morbidities;Cooperation/participation level;Medical prognosis;Previous level of function;Severity of impairments;Family/community support   SLP Home Exercise Plan To be determined   Consulted and Agree with Plan of Care Patient;Family member/caregiver   Family Member Consulted Spouse       Patient will benefit from skilled therapeutic intervention in order to improve the following deficits and impairments:   Aphasia    Problem List There are no active problems to display for this patient.   Leroy Kennedy 10/13/2015, 10:16 AM  [place] MAIN [facility] SERVICES [address] [place], [place], [address] Phone: [phone]   Fax:  [phone]   Name: Eluzer Howdeshell Sr. MRN: [id number] Date of Birth: 06/28/1950

## 2015-10-18 ENCOUNTER — Ambulatory Visit: Payer: PPO | Admitting: Speech Pathology

## 2015-10-18 ENCOUNTER — Ambulatory Visit: Payer: PPO | Admitting: Occupational Therapy

## 2015-10-18 ENCOUNTER — Ambulatory Visit: Payer: PPO | Admitting: Physical Therapy

## 2015-10-18 ENCOUNTER — Encounter: Payer: Self-pay | Admitting: Speech Pathology

## 2015-10-18 DIAGNOSIS — R278 Other lack of coordination: Secondary | ICD-10-CM

## 2015-10-18 DIAGNOSIS — R4701 Aphasia: Secondary | ICD-10-CM | POA: Diagnosis not present

## 2015-10-18 DIAGNOSIS — M6281 Muscle weakness (generalized): Secondary | ICD-10-CM

## 2015-10-18 DIAGNOSIS — R262 Difficulty in walking, not elsewhere classified: Secondary | ICD-10-CM

## 2015-10-18 NOTE — Therapy (Signed)
[place] MAIN [facility] SERVICES [address] [place], [place], [address] Phone: [phone]   Fax:  [phone]  Physical Therapy Treatment  Patient Details  Name: Jeremy Rohlik Sr. MRN: [id number] Date of Birth: 07/09/1950 Referring Provider: Dr. Kerrin Mo  Encounter Date: 10/18/2015      PT End of Session - 10/18/15 1033    Visit Number 16   Date for PT Re-Evaluation 10/02/15   Authorization Type gcode 9   PT Start Time 1000   PT Stop Time 0140   PT Time Calculation (min) 940 min   Activity Tolerance Patient tolerated treatment well      Past Medical History  Diagnosis Date  . Arthritis pain   . Polio     age 65    Past Surgical History  Procedure Laterality Date  . Colonoscopy  2013    Dr Vira Agar  . Hernia repair Right 03/28/14    inguinal hernia repair    There were no vitals filed for this visit.      Subjective Assessment - 10/18/15 1033    Subjective Patient is not having  back pain today.     Pertinent History cva 2 months ago   Currently in Pain? No/denies   Pain Onset In the past 7 days         Therapeutic exercise and neuromuscular training: 1/2 foam flat side up and balance with head turns left and right feet apart and feet together, tandem standing on 1/2 foam  standing hip abd with YTB x 20  side stepping left and right in parallel bars 10 feet x 3 step ups from floor to 6 inch stool x 20 bilateral marching in parallel bars x 20 Tilt board fwd/bwd, side to side left and right Tm walking 1. 5 m/hour x 5 mins TM walking side stepping left and right x . 4 miles / hour Patient needs occasional verbal cueing to improve posture and cueing to correctly perform exercises slowly, holding at end of range to increase motor firing of desired muscle to encourage fatigue.                                  PT Education - 10/18/15 1033    Education provided Yes   Education  Details slowing down and wearing tie shoes   Person(s) Educated Patient   Methods Explanation   Comprehension Verbalized understanding             PT Long Term Goals - 08/14/15 1636    PT LONG TERM GOAL #1   Title Patient will be independent in home exercise program to improve strength/mobility for better functional independence with ADLs   Time 8   Period Weeks   Status New   PT LONG TERM GOAL #2   Title Patient (> 76 years old) will complete five times sit to stand test in < 15 seconds indicating an increased LE strength and improved balance.   Time 8   Period Weeks   Status New   PT LONG TERM GOAL #3   Title . Patient will increase 10 meter walk test to >1.58m/s as to improve gait speed for better community ambulation and to reduce fall risk   Time 8   Period Weeks   Status New               Plan - 10/18/15 1034  Clinical Impression Statement PT provided min  verbal instruction to improve set up, proper use of LE, and improved posture and gait mechanics. Patient responded moderately to instruction   Rehab Potential Good   Clinical Impairments Affecting Rehab Potential  impaired standing balance, weakess RLE hip add/extension and PF B ankles   PT Frequency 2x / week   PT Duration 8 weeks   PT Treatment/Interventions Therapeutic activities;Therapeutic exercise;Balance training;Functional mobility training;Gait training;Stair training   PT Next Visit Plan strengthening and balance training   PT Home Exercise Plan SLS in corner   Consulted and Agree with Plan of Care Patient      Patient will benefit from skilled therapeutic intervention in order to improve the following deficits and impairments:  Abnormal gait, Decreased mobility, Decreased activity tolerance, Decreased endurance, Decreased strength, Decreased balance, Difficulty walking  Visit Diagnosis: Muscle weakness (generalized) - Plan: PT PLAN OF CARE CERT/RE-CERT  Difficulty in walking, not elsewhere  classified - Plan: PT PLAN OF CARE CERT/RE-CERT     Problem List There are no active problems to display for this patient. Alanson Puls, PT, [name]  [place], [place] S 10/18/2015, 10:36 AM  [place] MAIN [facility] SERVICES [address] [place], [place], [address] Phone: [phone]   Fax:  [phone]  Name: Jeremy Traver Sr. MRN: [id number] Date of Birth: [birth year]

## 2015-10-18 NOTE — Therapy (Signed)
[place] MAIN [facility] SERVICES [address] [place], [place], [address] Phone: [phone]   Fax:  [phone]  Speech Language Pathology Treatment  Patient Details  Name: Jeremy Gaby Sr. MRN: [id number] Date of Birth: 1950/12/18 Referring Provider: Dr. Caryl Comes  Encounter Date: 10/18/2015      End of Session - 10/18/15 1322    Visit Number 13   Number of Visits 17   Date for SLP Re-Evaluation 10/27/15   SLP Start Time 81   SLP Stop Time  1200   SLP Time Calculation (min) 60 min   Activity Tolerance Patient tolerated treatment well      Past Medical History  Diagnosis Date  . Arthritis pain   . Polio     age 55    Past Surgical History  Procedure Laterality Date  . Colonoscopy  2013    Dr Vira Agar  . Hernia repair Right 03/28/14    inguinal hernia repair    There were no vitals filed for this visit.      Subjective Assessment - 10/18/15 1321    Subjective Jeremy Harrell reports that his speech is "better". His wife feels that his soft voice and poor articulation are what reduce his intelligibility most.   Patient is accompained by: Family member   Currently in Pain? No/denies               ADULT SLP TREATMENT - 10/18/15 0001    General Information   Behavior/Cognition Alert;Cooperative;Pleasant mood   HPI CVA 06/11/2015   Treatment Provided   Treatment provided Cognitive-Linquistic   Pain Assessment   Pain Assessment No/denies pain   Cognitive-Linquistic Treatment   Treatment focused on Aphasia   Skilled Treatment HIGH LEVEL WORD FINDING: When presented with a familiar picture, Jeremy Harrell was able to generate a complete grammatical sentence 75% of the time independently and 100% of the time with mod cues to assist with word finding. When provided with a starting phrase (i.e. Early in the morning.[name] Kitchen), he was able to generate a complete grammatical sentence 60% of the time independently and 95% of the time with  mod cues. He was able to complete basic analogies with 70% accuracy independently and 95% accuracy with min cues. When cued to use a louder, clearer voice when responding, Jeremy Harrell was only able to articulate more clearly but not elevate his volume.    Assessment / Recommendations / Plan   Plan Continue with current plan of care   Progression Toward Goals   Progression toward goals Progressing toward goals          SLP Education - 10/18/15 1321    Education provided Yes   Education Details Improving clarity of speech through over-articulation and slowed rate   Person(s) Educated Patient   Methods Explanation   Comprehension Verbalized understanding            SLP Long Term Goals - 10/04/15 1421    SLP LONG TERM GOAL #1   Title Patient will generate grammatical and cogent sentence to complete simple/concrete linguistic task with 80% accuracy.   Time 8   Period Weeks   Status Partially Met   SLP LONG TERM GOAL #2   Title Patient will generate grammatical and cogent sentence to complete abstract/complex linguistic task with 80% accuracy.   Time 8   Period Weeks   Status On-going   SLP LONG TERM GOAL #3   Title Patient will complete high level word finding tasks  with 80% accuracy.   Time 8   Period Weeks   Status Partially Met          Plan - 10/18/15 1322    Clinical Impression Statement Jeremy Harrell continues to make progress with his word finding skills, and has many strategies to aid with his communication. He is inclined to resort to gesturing or sound effects instead of using other tools to find the appropriate word. He will benefit from continued skilled intervention to improve his intelligibility through volume and slowed rate.   Speech Therapy Frequency 2x / week   Duration Other (comment)   Treatment/Interventions Language facilitation;Patient/family education;SLP instruction and feedback;Multimodal communcation approach   Potential to Achieve Goals Good    Potential Considerations Ability to learn/carryover information;Co-morbidities;Cooperation/participation level;Medical prognosis;Previous level of function;Severity of impairments;Family/community support   SLP Home Exercise Plan To be determined   Consulted and Agree with Plan of Care Patient;Family member/caregiver   Family Member Consulted Spouse      Patient will benefit from skilled therapeutic intervention in order to improve the following deficits and impairments:   Aphasia    Problem List There are no active problems to display for this patient.   Leroy Kennedy 10/18/2015, 1:23 PM  [place] MAIN [facility] SERVICES [address] [place], [place], [address] Phone: [phone]   Fax:  [phone]   Name: Jeremy Summer Sr. MRN: [id number] Date of Birth: March 15, 1951

## 2015-10-19 ENCOUNTER — Encounter: Payer: Self-pay | Admitting: Occupational Therapy

## 2015-10-19 NOTE — Therapy (Signed)
[place] MAIN [facility] SERVICES [address] [place], [place], [address] Phone: [phone]   Fax:  [phone]  Occupational Therapy Treatment  Patient Details  Name: Jeremy Coey Sr. MRN: [id number] Date of Birth: [birth year] Referring Provider: Ramonita Lab  Encounter Date: 10/18/2015      OT End of Session - 10/19/15 1003    Visit Number 11   Number of Visits 16   Date for OT Re-Evaluation 12/04/15   Authorization Type Medicare G codes 11   OT Start Time 0900   OT Stop Time 0945   OT Time Calculation (min) 45 min   Activity Tolerance Patient tolerated treatment well   Behavior During Therapy [facility] for tasks assessed/performed      Past Medical History  Diagnosis Date  . Arthritis pain   . Polio     age 65    Past Surgical History  Procedure Laterality Date  . Colonoscopy  2013    Dr Vira Agar  . Hernia repair Right 03/28/14    inguinal hernia repair    There were no vitals filed for this visit.      Subjective Assessment - 10/19/15 1000    Subjective  Reports he is doing well, grand daughter present this date   Patient Stated Goals "to use my right hand better especially for writing"   Currently in Pain? No/denies   Pain Score 0-No pain                      OT Treatments/Exercises (OP) - 10/19/15 1001    ADLs   Writing Patient participated in in writing tasks with regular pen formulating list of sports and mens names, cues required for completing list. Legibility was 80- 90 percent.    Fine Motor Coordination   Other Fine Motor Exercises Patient was seen for fine motor coordination tasks with emphasis on speed and dexterity  with manipulation of toothpicks from tabletop and placing into small slotted container. Participation and manipulation of small pieces of Purdue pegboard with small washers, collars and dowels with emphasis on speed completing full assembly of pieces and then removing using the hand  for storage and sorting pieces from Brent to fingertips. Second round completed with assembly of all dowels followed by collars and then washers.    Neurological Re-education Exercises   Other Exercises 1 Patient was seen this date for right upper extremity strengthening tasks with sustained grip using hand gripper on fourth setting 23.4 pounds for 25 reps, he was then able to complete setting 5,  28.9 pounds for five repetitions.                 OT Education - 10/19/15 1003    Education provided Yes   Education Details HEP, writing   Person(s) Educated Spouse   Methods Explanation;Demonstration;Verbal cues   Comprehension Verbal cues required;Returned demonstration;Verbalized understanding             OT Long Term Goals - 10/12/15 1707    OT LONG TERM GOAL #1   Title Patient will improve RUE strength by 1 mm grade to be able to lift items up to 8# without difficulty.     Baseline difficulty with using right hand to lift items such as milk jug.    Time 8   Period Weeks   Status Achieved   OT LONG TERM GOAL #2   Title Patient will increase right hand grip strength by 10# to be  able to open jars and containers with modified independence.   Baseline unable   Time 8   Period Weeks   Status Achieved   OT LONG TERM GOAL #3   Title Patient will improve right hand coordination to write his name and address on important papers with 90% legibility.   Baseline legibility less than 25%.   Time 8   Period Weeks   Status On-going   OT LONG TERM GOAL #4   Title Patient will complete lower body dressing with modified independence including donning socks.   Baseline difficulty with donning socks and needs occasional assist.   Time 8   Period Weeks   Status Partially Met   OT LONG TERM GOAL #5   Title Patient will perform house hold chores and yard work with supervision.   Baseline moved so he has no yard, light housekeeping    Time 8   Period Weeks   Status Achieved   OT LONG  TERM GOAL #6   Title Patient will improve fine motor coordination to pick up and manipulate coins to make change independently.   Time 8   Period Weeks   Status Achieved   OT LONG TERM GOAL #7   Title Patient will complete breakfast meal including cooking on stovetop with supervision only.   Baseline unable   Time 8   Period Weeks   Status New               Plan - 10/19/15 1003    Clinical Impression Statement Patient continues to progress with right upper extremity he strength and coordination and is developing improved speed and dexterity of the right-hand to participate in daily tasks at home with greater independence. He does have difficulty with managing flat and Glennon Mac are just small washers and moving from the palm of his hand to fingertips. His hand writing has improved to 80 to 90% legibility on this day. Continue to focus on these tasks to improve independence with self-care and light homemaking at home. He would benefit from kitchen tasks to prepare light meals such as breakfast.    Rehab Potential Good   OT Frequency 2x / week   OT Duration 8 weeks   OT Treatment/Interventions Self-care/ADL training;Therapeutic exercise;Patient/family education;Neuromuscular education;Manual Therapy;Balance training;Therapeutic exercises;DME and/or AE instruction;Therapeutic activities;Cognitive remediation/compensation   Consulted and Agree with Plan of Care Patient      Patient will benefit from skilled therapeutic intervention in order to improve the following deficits and impairments:  Decreased activity tolerance, Decreased knowledge of use of DME, Decreased strength, Impaired flexibility, Decreased balance, Decreased cognition, Decreased range of motion, Decreased coordination, Impaired UE functional use, Difficulty walking, Decreased mobility  Visit Diagnosis: Muscle weakness (generalized)  Other lack of coordination    Problem List There are no active problems to display  for this patient.  Achilles Dunk, OTR/L, CLT  Lovett,Amy 10/19/2015, 10:06 AM  [place] MAIN [facility] SERVICES [address] [place], [place], [address] Phone: [phone]   Fax:  [phone]  Name: Penn Grissett Sr. MRN: [id number] Date of Birth: 1950-10-21

## 2015-10-20 ENCOUNTER — Ambulatory Visit: Payer: PPO | Admitting: Physical Therapy

## 2015-10-20 ENCOUNTER — Encounter: Payer: Self-pay | Admitting: Occupational Therapy

## 2015-10-20 ENCOUNTER — Encounter: Payer: Self-pay | Admitting: Physical Therapy

## 2015-10-20 ENCOUNTER — Ambulatory Visit: Payer: PPO | Admitting: Occupational Therapy

## 2015-10-20 ENCOUNTER — Ambulatory Visit: Payer: PPO | Admitting: Speech Pathology

## 2015-10-20 ENCOUNTER — Encounter: Payer: Self-pay | Admitting: Speech Pathology

## 2015-10-20 DIAGNOSIS — M6281 Muscle weakness (generalized): Secondary | ICD-10-CM

## 2015-10-20 DIAGNOSIS — R278 Other lack of coordination: Secondary | ICD-10-CM

## 2015-10-20 DIAGNOSIS — R4701 Aphasia: Secondary | ICD-10-CM | POA: Diagnosis not present

## 2015-10-20 DIAGNOSIS — R262 Difficulty in walking, not elsewhere classified: Secondary | ICD-10-CM

## 2015-10-20 NOTE — Patient Instructions (Signed)
Patient instructed to keep using R hand for fine motor tasks at home and monitor elbow and shoulder position when muscles start to fatigue.

## 2015-10-20 NOTE — Therapy (Signed)
[place] MAIN [facility] SERVICES [address] [place], [place], [address] Phone: [phone]   Fax:  [phone]  Speech Language Pathology Treatment  Patient Details  Name: Jeremy Ray Sr. MRN: [id number] Date of Birth: Jul 20, 1950 Referring Provider: Dr. Caryl Comes  Encounter Date: 10/20/2015      End of Session - 10/20/15 1213    Visit Number 14   Number of Visits 17   Date for SLP Re-Evaluation 10/27/15   SLP Start Time 14   SLP Stop Time  1155   SLP Time Calculation (min) 55 min   Activity Tolerance Patient tolerated treatment well      Past Medical History  Diagnosis Date  . Arthritis pain   . Polio     age 37    Past Surgical History  Procedure Laterality Date  . Colonoscopy  2013    Dr Vira Agar  . Hernia repair Right 03/28/14    inguinal hernia repair    There were no vitals filed for this visit.      Subjective Assessment - 10/20/15 1211    Subjective Jeremy Harrell reports that his speech is "good". Jeremy Harrell denies giving up when he has word finding difficulties but his wife feels that he does. He was advised to work to find his words instead of miming, even if he needs to come back to it later.   Patient is accompained by: Family member   Currently in Pain? No/denies               ADULT SLP TREATMENT - 10/20/15 0001    General Information   Behavior/Cognition Alert;Cooperative;Pleasant mood   HPI CVA 06/11/2015   Treatment Provided   Treatment provided Cognitive-Linquistic   Pain Assessment   Pain Assessment No/denies pain   Cognitive-Linquistic Treatment   Treatment focused on Aphasia   Skilled Treatment HIGH LEVEL WORD FINDING: When presented with a familiar picture, Jeremy Harrell was able to generate a complete grammatical sentence 80% of the time independently and 95% of the time with mod cues to assist with word finding. When provided with two unrelated words, he was able to generate a complete  grammatical sentence 75% of the time independently and 100% of the time with min cues. He was able to complete basic analogies with 85% accuracy independently and use those responses to generate a full sentence 95% of the time when allowed multiple tries.   Assessment / Recommendations / Plan   Plan Continue with current plan of care   Progression Toward Goals   Progression toward goals Progressing toward goals          SLP Education - 10/20/15 1211    Education provided Yes   Education Details Word finding strategies   Person(s) Educated Patient;Spouse   Methods Explanation   Comprehension Verbalized understanding            SLP Long Term Goals - 10/04/15 1421    SLP LONG TERM GOAL #1   Title Patient will generate grammatical and cogent sentence to complete simple/concrete linguistic task with 80% accuracy.   Time 8   Period Weeks   Status Partially Met   SLP LONG TERM GOAL #2   Title Patient will generate grammatical and cogent sentence to complete abstract/complex linguistic task with 80% accuracy.   Time 8   Period Weeks   Status On-going   SLP LONG TERM GOAL #3   Title Patient will complete high level word finding tasks with  80% accuracy.   Time 8   Period Weeks   Status Partially Met          Plan - 10/20/15 1213    Clinical Impression Statement Jeremy Harrell continues to make progress with his word finding skills, and has many strategies to aid with his communication. He tends to grope for words as opposed to thinking ahead which is distracting to the listener. He was advised to think of a full sentence in his head when possible to avoid so many corrections to his speech.  He will benefit from continued skilled intervention to improve his intelligibility through volume and slowed rate.   Speech Therapy Frequency 2x / week   Duration Other (comment)   Treatment/Interventions Language facilitation;Patient/family education;SLP instruction and feedback;Multimodal  communcation approach   Potential to Achieve Goals Good   SLP Home Exercise Plan To be determined   Consulted and Agree with Plan of Care Patient;Family member/caregiver   Family Member Consulted Spouse      Patient will benefit from skilled therapeutic intervention in order to improve the following deficits and impairments:   Aphasia    Problem List There are no active problems to display for this patient.   Leroy Kennedy 10/20/2015, 12:14 PM  [place] MAIN [facility] SERVICES [address] [place], [place], [address] Phone: [phone]   Fax:  [phone]   Name: Jeremy Brittian Sr. MRN: [id number] Date of Birth: 1950/08/17

## 2015-10-20 NOTE — Therapy (Signed)
[place] MAIN [facility] SERVICES [address] [place], [place], [address] Phone: [phone]   Fax:  [phone]  Physical Therapy Treatment  Patient Details  Name: Jeremy Verdell Sr. MRN: [id number] Date of Birth: June 05, 1950 Referring Provider: Dr. Kerrin Mo  Encounter Date: 10/20/2015      PT End of Session - 10/20/15 1037    Visit Number 17   Number of Visits 25   Date for PT Re-Evaluation 12/04/15   Authorization Type gcode 8   PT Start Time 1030   PT Stop Time 1100   PT Time Calculation (min) 30 min   Equipment Utilized During Treatment Gait belt   Activity Tolerance Patient tolerated treatment well;No increased pain   Behavior During Therapy [facility] for tasks assessed/performed      Past Medical History  Diagnosis Date  . Arthritis pain   . Polio     age 65    Past Surgical History  Procedure Laterality Date  . Colonoscopy  2013    Dr Vira Agar  . Hernia repair Right 03/28/14    inguinal hernia repair    There were no vitals filed for this visit.      Subjective Assessment - 10/20/15 1034    Subjective Patient reports doing well today; He denies any new falls; Denies any pain; Reports doing better with walking since wearing new shoes. However he reports, "I hate that they aren't slip on."   Pertinent History cva 2 months ago   Currently in Pain? No/denies   Pain Onset In the past 7 days         TREATMENT: Forward/backward walking on airex beam, tandem gait x3 lap with CGA for  Balance and cues to slow down steps for better balance control; Tandem stance on airex balance beam x15 sec each foot in front, min A for balance; Standing sideways on airex beam, alternate march x 12 each without rail assist with CGA for balance; Patient utilized Geologist, engineering for visual cues to improve balance control;   SLS on firm surface 10 sec each LE x2 each with cues for erect posture for better stance control; Standing modified tandem  stance with one foot on floor, one foot on dyna disc with BUE ball up/down x2, side/side twist x5 each, each LE on dyna disc x1 set each, CGA for balance and cues to increase ROM for better balance challenge with BUE reach;  Tandem stance on floor able to hold unsupported for >10 sec with good positioning and balance;  SLS on airex pad: with BUE ball toss and catch against mirror x 12 tosses each LE with min A ; required min VCs to increase trunk control for better balance safety;  Modified tandem stance on airex pad with BUE ball toss up and catch x 12 tosses each foot in front;   Marching on BOSU with step off BOSU, unsupported with min A for balance x5 sets with cues to increase step length when stepping down for better balance control; Forward lunges without HHA x10 each LE with cues to keep trunk oriented forward, cues to bend back knee for better positioning and to aim front leg towards center of BOSU for better balance. Patient required CGA for safety;   Tandem stance on airex with small ball toss, unsupported x15 throws with each foot in front; patient requires cues to keep feet close together and avoid stepping outside tandem stance for better balance challenge; Feet forward on upside down 1/2 bolster with  flat side up, rocking into heel/toe raises for better ankle stretch and ankle strengthening x15 bilaterally;    Patient able to perform most exercise, unsupported with CGA-min A for balance with cues for better trunk control and weight shift to improve balance safety;                           PT Education - 10/20/15 1037    Education provided Yes   Education Details balance exercise, gait safety, HEP reinforced;    Person(s) Educated Patient   Methods Explanation;Verbal cues   Comprehension Verbalized understanding;Returned demonstration;Verbal cues required             PT Long Term Goals - 08/14/15 1636    PT LONG TERM GOAL #1   Title Patient will be  independent in home exercise program to improve strength/mobility for better functional independence with ADLs   Time 8   Period Weeks   Status New   PT LONG TERM GOAL #2   Title Patient (> 66 years old) will complete five times sit to stand test in < 15 seconds indicating an increased LE strength and improved balance.   Time 8   Period Weeks   Status New   PT LONG TERM GOAL #3   Title . Patient will increase 10 meter walk test to >1.4688m/s as to improve gait speed for better community ambulation and to reduce fall risk   Time 8   Period Weeks   Status New               Plan - 10/20/15 1044    Clinical Impression Statement Patient instructed in advanced static and dynamic balance exercise. He had increased difficulty with reaching outside base of support while on SLS or tandem stance on uneven surfaces. He required CGA-min A for safety. Patient also required min VCs to improve trunk control and better weight shift for increased balance control. He also required cues to slow down LE movement for better balance safety when walking backwards on airex beam. He would benefit from additional skilled PT intervention to improve balance/gait safety and reduce fall risk;    Rehab Potential Good   Clinical Impairments Affecting Rehab Potential  impaired standing balance, weakess RLE hip add/extension and PF B ankles   PT Frequency 2x / week   PT Duration 8 weeks   PT Treatment/Interventions Therapeutic activities;Therapeutic exercise;Balance training;Functional mobility training;Gait training;Stair training   PT Next Visit Plan consider discharge next visit due to high level   PT Home Exercise Plan continue as given;    Consulted and Agree with Plan of Care Patient      Patient will benefit from skilled therapeutic intervention in order to improve the following deficits and impairments:  Abnormal gait, Decreased mobility, Decreased activity tolerance, Decreased endurance, Decreased strength,  Decreased balance, Difficulty walking  Visit Diagnosis: Muscle weakness (generalized)  Other lack of coordination  Difficulty in walking, not elsewhere classified     Problem List There are no active problems to display for this patient.  Trula Oreae Canfield, SPT This entire session was performed under direct supervision and direction of a licensed therapist/therapist assistant . I have personally read, edited and approve of the note as written.  Trotter,Margaret PT, [name] 10/20/2015, 11:02 AM  [place] [facility] [address] [place], [place], [address] Phone: [phone]   Fax:  [phone]  Name: Jeremy SchillingHoward Thomas Sweis Sr. MRN: [id number]  Date of Birth: [birth year]

## 2015-10-20 NOTE — Therapy (Signed)
[place] MAIN [facility] SERVICES [address] [place], [place], [address] Phone: [phone]   Fax:  [phone]  Occupational Therapy Treatment  Patient Details  Name: Jeremy Lada Sr. MRN: [id number] Date of Birth: [birth year] Referring Provider: Ramonita Lab  Encounter Date: 10/20/2015      OT End of Session - 10/20/15 1235    Visit Number 12   Number of Visits 16   Date for OT Re-Evaluation 12/04/15   Authorization Type Medicare G codes 12   OT Start Time 0935   OT Stop Time 1020   OT Time Calculation (min) 45 min   Activity Tolerance Patient tolerated treatment well   Behavior During Therapy [facility] for tasks assessed/performed      Past Medical History  Diagnosis Date  . Arthritis pain   . Polio     age 18    Past Surgical History  Procedure Laterality Date  . Colonoscopy  2013    Dr Vira Agar  . Hernia repair Right 03/28/14    inguinal hernia repair    There were no vitals filed for this visit.      Subjective Assessment - 10/20/15 1221    Subjective  Pt indicated he was excited about working on his writing again.   Patient Stated Goals "to use my right hand better especially for writing"   Currently in Pain? No/denies                      OT Treatments/Exercises (OP) - 10/20/15 0001    ADLs   Writing Patient seen for writing skills and tried pen gripper but he did not like how it felt and writing was same with and without gripper.  He was able to write a list of favorite dinner foods with 90% legibility intially for first 4 words and decreased to 70% as his hand fatigued.  [name] Motor Coordination   Other Fine Motor Exercises Patient is making progress with fine motor control of R hand and worked on problem solving and isolated finger movements to use small tongs to pick up very small beads and place in container at various positions on table.  Increased difficulty with beads when on edge of  container vs center where it was flat.  Mod cues for proper hand position and using  2 or 3 point pinch vs gross grasp.  Grooved peg board timed with patient able to place small pegs in slots at different angles for 1 min 45 seconds first trial and 1 minute 26 seconds for second trial to work on speed and accuracy.   Neurological Re-education Exercises   Other Exercises 1 Patient seen for rubber band exercises for R hand using blue and green bands to increase strength and coordination.  He tolerated 3 sets of 10 before hand was too fatigued to continue.                  OT Education - 10/20/15 1234    Education provided Yes   Education Details fine motor exercises to do at home   Person(s) Educated Patient   Methods Explanation   Comprehension Verbalized understanding             OT Long Term Goals - 10/20/15 1241    OT LONG TERM GOAL #3   Title Patient will improve right hand coordination to write his name and address on important papers with 90% legibility.   Baseline legibility  less than 25%.   Time 8   Period Weeks   Status On-going   OT LONG TERM GOAL #4   Title Patient will complete lower body dressing with modified independence including donning socks.   Baseline difficulty with donning socks and needs occasional assist.   Time 8   Period Weeks   Status Partially Met   OT LONG TERM GOAL #7   Title Patient will complete breakfast meal including cooking on stovetop with supervision only.   Baseline unable   Time 8   Period Weeks   Status On-going               Plan - 10/20/15 1236    Clinical Impression Statement Patient continues to make good progress with fine motor skills and control of R hand but is limited due to fatigue for strengthening isolated finger muscles and when writing.  He tried using a pen gripper to help with this, but did notl ike the feel of it and legibiltiy did not improve with it. Speed and dexterity continue to improve.  He is  able to tolerate the thickest rubber band for hand strengthening now, 3 sets of 10.  Rec continued therapy to increase further independence with self care and light home making skills.     Rehab Potential Good   OT Frequency 2x / week   OT Duration 8 weeks   OT Treatment/Interventions Self-care/ADL training;Therapeutic exercise;Patient/family education;Neuromuscular education;Manual Therapy;Balance training;Therapeutic exercises;DME and/or AE instruction;Therapeutic activities;Cognitive remediation/compensation   Consulted and Agree with Plan of Care Patient      Patient will benefit from skilled therapeutic intervention in order to improve the following deficits and impairments:  Decreased activity tolerance, Decreased knowledge of use of DME, Decreased strength, Impaired flexibility, Decreased balance, Decreased cognition, Decreased range of motion, Decreased coordination, Impaired UE functional use, Difficulty walking, Decreased mobility  Visit Diagnosis: Muscle weakness (generalized)  Other lack of coordination    Problem List There are no active problems to display for this patient.   Chrys Racer, OTR/L ascom [phone] 10/20/2015, 12:43 PM  [place] MAIN [facility] SERVICES [address] [place], [place], [address] Phone: [phone]   Fax:  [phone]  Name: Jeremy Fessel Sr. MRN: [id number] Date of Birth: 1950-08-20

## 2015-10-25 ENCOUNTER — Ambulatory Visit: Payer: PPO | Admitting: Occupational Therapy

## 2015-10-25 ENCOUNTER — Encounter: Payer: Self-pay | Admitting: Physical Therapy

## 2015-10-25 ENCOUNTER — Ambulatory Visit: Payer: PPO | Admitting: Physical Therapy

## 2015-10-25 ENCOUNTER — Ambulatory Visit: Payer: PPO | Admitting: Speech Pathology

## 2015-10-25 ENCOUNTER — Encounter: Payer: Self-pay | Admitting: Speech Pathology

## 2015-10-25 DIAGNOSIS — M6281 Muscle weakness (generalized): Secondary | ICD-10-CM

## 2015-10-25 DIAGNOSIS — R278 Other lack of coordination: Secondary | ICD-10-CM

## 2015-10-25 DIAGNOSIS — R4701 Aphasia: Secondary | ICD-10-CM | POA: Diagnosis not present

## 2015-10-25 DIAGNOSIS — R262 Difficulty in walking, not elsewhere classified: Secondary | ICD-10-CM

## 2015-10-25 NOTE — Therapy (Signed)
[place] MAIN [facility] SERVICES [address] [place], [place], [address] Phone: [phone]   Fax:  [phone]  Physical Therapy Treatment  Patient Details  Name: Jeremy Garcia Sr. MRN: [id number] Date of Birth: February 02, 1951 Referring Provider: Dr. Kerrin Mo  Encounter Date: 10/25/2015      PT End of Session - 10/25/15 0933    Visit Number 18   Number of Visits 25   Date for PT Re-Evaluation 12/04/15   Authorization Type gcode 8   PT Start Time 1000   PT Stop Time 1040   PT Time Calculation (min) 40 min   Equipment Utilized During Treatment Gait belt   Activity Tolerance Patient tolerated treatment well;No increased pain   Behavior During Therapy [facility] for tasks assessed/performed      Past Medical History  Diagnosis Date  . Arthritis pain   . Polio     age 65    Past Surgical History  Procedure Laterality Date  . Colonoscopy  2013    Dr Vira Agar  . Hernia repair Right 03/28/14    inguinal hernia repair    There were no vitals filed for this visit.      Subjective Assessment - 10/25/15 0932    Subjective Patient reports doing well today; He denies any new falls; Denies any pain; Reports doing better with walking since wearing new shoes. However he reports, "I hate that they aren't slip on."   Pertinent History cva 2 months ago   Pain Onset In the past 7 days         Therex: TM x 5 mins side stepping left and right  Standing march 2x10 Fwd/retro walking without UE x 3 laps Side steps x 3 laps   Mini squat 2x10 Standing hip abd 2x10 Standing ankle PF/ DF 2x10 LAQ 2x10 Pt requires mod verbal and tactile cues for proper exercise performance Neuromuscular Re-education   Marching in place on blue foam pad x 30 seconds for 2 sets   Tandem standing in // bars  x 1 minute Step ups to blue foam pad x 10 bilaterally for 2 sets bilaterally   Heel raises bilateral feet  Sit to stand training x5 repetitions for 3  sets Matrix x 5 reps fwd/bwd/side stepping Four square stepping.side to side, front and back and diagonals Feet together on blue foam and holding ball x 1 minute, holding ball and fwd and bwd movement elbow flex/ext x 20, horizontal abd/add with ball and arms extended. Patient needs occasional verbal cueing to improve posture and cueing to correctly perform exercises slowly, holding at end of range to increase motor firing of desired muscle to encourage fatigue.                          PT Education - 10/25/15 0933    Education provided Yes   Education Details HEP and plan of care   Person(s) Educated Patient   Methods Explanation   Comprehension Verbalized understanding             PT Long Term Goals - 08/14/15 1636    PT LONG TERM GOAL #1   Title Patient will be independent in home exercise program to improve strength/mobility for better functional independence with ADLs   Time 8   Period Weeks   Status New   PT LONG TERM GOAL #2   Title Patient (> 64 years old) will complete five times sit to stand  test in < 15 seconds indicating an increased LE strength and improved balance.   Time 8   Period Weeks   Status New   PT LONG TERM GOAL #3   Title . Patient will increase 10 meter walk test to >1.42m/s as to improve gait speed for better community ambulation and to reduce fall risk   Time 8   Period Weeks   Status New             Patient will benefit from skilled therapeutic intervention in order to improve the following deficits and impairments:     Visit Diagnosis: Muscle weakness (generalized)  Difficulty in walking, not elsewhere classified     Problem List There are no active problems to display for this patient.  Alanson Puls, PT, [name] [place], [place] S 10/25/2015, 9:35 AM  [place] MAIN [facility] SERVICES [address] [place], [place], [address] Phone: [phone]   Fax:   [phone]  Name: Jeremy Dashnaw Sr. MRN: [id number] Date of Birth: [birth year]

## 2015-10-25 NOTE — Patient Instructions (Addendum)
OT TREATMENT    Neuro muscular re-education:  Pt. worked on tasks to sustain lateral pinch on resistive tweezers while grasping and moving 2" toothpick sticks from a horizontal flat position to a vertical position in order to place it in the holder. Pt. was able to sustain grasp while positioning and extending the wrist/hand in the necessary alignment needed to place the stick through the top of the holder. Pt. Worked on grasping mini clips with thumb and 2nd digit.  Therapeutic Exercise:  Pt. performed 3#, upgraded to 5# dowel ex. For UE strengthening secondary to weakness. Bilateral shoulder flexion, chest press, circular patterns, and elbow flexion/extension were performed. 3# dumbbell ex. for elbow flexion and extension, forearm supination/pronation, wrist flexion/extension, and radial deviation. Pt. requires rest breaks and verbal cues for proper technique. Pt. performed gross gripping with grip strengthener. Pt. worked on sustaining grip while grasping pegs and reaching at various heights. Pt. was provided with Nyoka Cowden theraputty, and reviewed ex. For gross gripping, gross digit extension, thumb abduction, thumb opposition to 2nd through 5th digits, digit abduction/adduction.

## 2015-10-25 NOTE — Therapy (Signed)
[place] MAIN [facility] SERVICES [address] [place], [place], [address] Phone: [phone]   Fax:  [phone]  Speech Language Pathology Treatment  Patient Details  Name: Jeremy Vanaken Sr. MRN: [id number] Date of Birth: 02-04-1951 Referring Provider: Dr. Caryl Comes  Encounter Date: 10/25/2015      End of Session - 10/25/15 1252    Visit Number 15   Number of Visits 17   Date for SLP Re-Evaluation 10/27/15   SLP Start Time 1105   SLP Stop Time  1200   SLP Time Calculation (min) 55 min   Activity Tolerance Patient tolerated treatment well      Past Medical History  Diagnosis Date  . Arthritis pain   . Polio     age 38    Past Surgical History  Procedure Laterality Date  . Colonoscopy  2013    Dr Vira Agar  . Hernia repair Right 03/28/14    inguinal hernia repair    There were no vitals filed for this visit.      Subjective Assessment - 10/25/15 1251    Subjective Jeremy Harrell reports that his speech is "pretty good". He feels like he is speaking loudly all the time, but his wide reports that she is unable to hear him at home. Jeremy Harrell is not receptive to voice therapy at this time.   Patient is accompained by: Family member   Currently in Pain? No/denies               ADULT SLP TREATMENT - 10/25/15 0001    General Information   Behavior/Cognition Alert;Cooperative;Pleasant mood   HPI CVA 06/11/2015   Treatment Provided   Treatment provided Cognitive-Linquistic   Pain Assessment   Pain Assessment No/denies pain   Cognitive-Linquistic Treatment   Treatment focused on Aphasia   Skilled Treatment HIGH LEVEL WORD FINDING: When presented with a familiar picture, Jeremy Harrell was able to generate a complete grammatical sentence 75% of the time independently and 90% of the time with mod cues to assist with word finding. When provided with two unrelated words, he was able to generate a complete grammatical sentence 75% of  the time independently and 90% of the time with min cues. When provided with a single noun, he was unable to create a grammatical question despite max cues. He was able to generate questions when provided with a starting phrase ("Who is.") with 60% accuracy with mod cues. He was able to brainstorm words in a given category with an average of 5 words in a minute.    Assessment / Recommendations / Plan   Plan Continue with current plan of care   Progression Toward Goals   Progression toward goals Progressing toward goals          SLP Education - 10/25/15 1251    Education provided Yes   Education Details improving voice and breath support   Person(s) Educated Patient;Spouse   Methods Explanation   Comprehension Verbalized understanding            SLP Long Term Goals - 10/04/15 1421    SLP LONG TERM GOAL #1   Title Patient will generate grammatical and cogent sentence to complete simple/concrete linguistic task with 80% accuracy.   Time 8   Period Weeks   Status Partially Met   SLP LONG TERM GOAL #2   Title Patient will generate grammatical and cogent sentence to complete abstract/complex linguistic task with 80% accuracy.   Time 8  Period Weeks   Status On-going   SLP LONG TERM GOAL #3   Title Patient will complete high level word finding tasks with 80% accuracy.   Time 8   Period Weeks   Status Partially Met          Plan - 10/25/15 1252    Clinical Impression Statement Jeremy Harrell continues to make progress with his word finding skills, and has many strategies to aid with his communication. He struggles to create grammatical sentences on the first try in isolation and in conversation. He will benefit from continued skilled intervention to improve his intelligibility through volume and slowed rate.   Speech Therapy Frequency 2x / week   Duration Other (comment)   Treatment/Interventions Language facilitation;Patient/family education;SLP instruction and  feedback;Multimodal communcation approach   Potential to Achieve Goals Good   Potential Considerations Ability to learn/carryover information;Co-morbidities;Cooperation/participation level;Medical prognosis;Previous level of function;Severity of impairments;Family/community support   SLP Home Exercise Plan To be determined   Consulted and Agree with Plan of Care Patient;Family member/caregiver   Family Member Consulted Spouse      Patient will benefit from skilled therapeutic intervention in order to improve the following deficits and impairments:   Aphasia    Problem List There are no active problems to display for this patient.   Leroy Kennedy 10/25/2015, 12:53 PM  [place] MAIN [facility] SERVICES [address] [place], [place], [address] Phone: [phone]   Fax:  [phone]   Name: Jeremy Councilman Sr. MRN: [id number] Date of Birth: 11-24-1950

## 2015-10-25 NOTE — Therapy (Signed)
[place] MAIN [facility] SERVICES [address] [place], [place], [address] Phone: [phone]   Fax:  [phone]  Occupational Therapy Treatment  Patient Details  Name: Jeremy Kirsh Sr. MRN: [id number] Date of Birth: October 26, 1950 Referring Provider: Ramonita Lab  Encounter Date: 10/25/2015      OT End of Session - 10/25/15 1007    Visit Number 13   Number of Visits 16   Date for OT Re-Evaluation 12/04/15   Authorization Type Medicare G codes 13   OT Start Time 0910   OT Stop Time 0955   OT Time Calculation (min) 45 min   Activity Tolerance Patient tolerated treatment well   Behavior During Therapy [facility] for tasks assessed/performed      Past Medical History  Diagnosis Date  . Arthritis pain   . Polio     age 65    Past Surgical History  Procedure Laterality Date  . Colonoscopy  2013    Dr Vira Agar  . Hernia repair Right 03/28/14    inguinal hernia repair    There were no vitals filed for this visit.      Subjective Assessment - 10/25/15 1008    Subjective  Pt. and wife are glad to work with the theraputty HEP.   Patient is accompained by: Family member   Currently in Pain? No/denies   Pain Score 0-No pain      OT TREATMENT    Neuro muscular re-education:  Pt. worked on tasks to sustain lateral pinch on resistive tweezers while grasping and moving 2" toothpick sticks from a horizontal flat position to a vertical position in order to place it in the holder. Pt. was able to sustain grasp while positioning and extending the wrist/hand in the necessary alignment needed to place the stick through the top of the holder. Pt. Worked on grasping mini clips with thumb and 2nd digit.  Therapeutic Exercise:  Pt. performed 3#, upgraded to 5# dowel ex. For UE strengthening secondary to weakness. Bilateral shoulder flexion, chest press, circular patterns, and elbow flexion/extension were performed. 3# dumbbell ex. for elbow flexion  and extension, forearm supination/pronation, wrist flexion/extension, and radial deviation. Pt. requires rest breaks and verbal cues for proper technique. Pt. performed gross gripping with grip strengthener. Pt. worked on sustaining grip while grasping pegs and reaching at various heights. Pt. was provided with Nyoka Cowden theraputty, and reviewed ex. For gross gripping, gross digit extension, thumb abduction, thumb opposition to 2nd through 5th digits, digit abduction/adduction.                            OT Education - 10/25/15 1007    Education provided Yes   Education Details Green Theraputty   Person(s) Educated Patient;Spouse   Methods Explanation;Demonstration   Comprehension Verbalized understanding             OT Long Term Goals - 10/20/15 1241    OT LONG TERM GOAL #3   Title Patient will improve right hand coordination to write his name and address on important papers with 90% legibility.   Baseline legibility less than 25%.   Time 8   Period Weeks   Status On-going   OT LONG TERM GOAL #4   Title Patient will complete lower body dressing with modified independence including donning socks.   Baseline difficulty with donning socks and needs occasional assist.   Time 8   Period Weeks   Status Partially Met  OT LONG TERM GOAL #7   Title Patient will complete breakfast meal including cooking on stovetop with supervision only.   Baseline unable   Time 8   Period Weeks   Status On-going               Plan - 10/25/15 1011    Clinical Impression Statement Pt. is improving with UE strength and coordination skills. Pt. required and tactile cues, as well as visual demonstration for proper hand movements with the putty. Pt. is Durene Cal progress with coordination skills in preparation for writing..   Rehab Potential Good   OT Frequency 2x / week   OT Duration 8 weeks   OT Treatment/Interventions Self-care/ADL training;Therapeutic exercise;Patient/family  education;Neuromuscular education;Manual Therapy;Balance training;Therapeutic exercises;DME and/or AE instruction;Therapeutic activities;Cognitive remediation/compensation      Patient will benefit from skilled therapeutic intervention in order to improve the following deficits and impairments:  Decreased activity tolerance, Decreased knowledge of use of DME, Decreased strength, Impaired flexibility, Decreased balance, Decreased cognition, Decreased range of motion, Decreased coordination, Impaired UE functional use, Difficulty walking, Decreased mobility  Visit Diagnosis: Muscle weakness (generalized)  Other lack of coordination    Problem List There are no active problems to display for this patient.  Harrel Carina, MS, OTR/L  Harrel Carina 10/25/2015, 10:16 AM  [place] MAIN [facility] SERVICES [address] [place], [place], [address] Phone: [phone]   Fax:  [phone]  Name: Jeremy Sowles Sr. MRN: [id number] Date of Birth: 1951-04-06

## 2015-10-27 ENCOUNTER — Encounter: Payer: Self-pay | Admitting: Occupational Therapy

## 2015-10-27 ENCOUNTER — Ambulatory Visit: Payer: PPO | Admitting: Physical Therapy

## 2015-10-27 ENCOUNTER — Ambulatory Visit: Payer: PPO | Admitting: Occupational Therapy

## 2015-10-27 ENCOUNTER — Encounter: Payer: Self-pay | Admitting: Speech Pathology

## 2015-10-27 ENCOUNTER — Ambulatory Visit: Payer: PPO | Admitting: Speech Pathology

## 2015-10-27 DIAGNOSIS — R278 Other lack of coordination: Secondary | ICD-10-CM

## 2015-10-27 DIAGNOSIS — R4701 Aphasia: Secondary | ICD-10-CM | POA: Diagnosis not present

## 2015-10-27 DIAGNOSIS — M6281 Muscle weakness (generalized): Secondary | ICD-10-CM

## 2015-10-27 NOTE — Therapy (Signed)
[place] MAIN [facility] SERVICES [address] [place], [place], [address] Phone: [phone]   Fax:  [phone]  Occupational Therapy Treatment  Patient Details  Name: Jeremy Creamer Sr. MRN: [id number] Date of Birth: 13-Jul-1950 Referring Provider: Ramonita Lab  Encounter Date: 10/27/2015      OT End of Session - 10/27/15 1221    Visit Number 14   Number of Visits 16   Date for OT Re-Evaluation 12/04/15   Authorization Type Medicare G codes 14   OT Start Time 3545   OT Stop Time 1100   OT Time Calculation (min) 45 min   Activity Tolerance Patient tolerated treatment well   Behavior During Therapy [facility] for tasks assessed/performed      Past Medical History  Diagnosis Date  . Arthritis pain   . Polio     age 65    Past Surgical History  Procedure Laterality Date  . Colonoscopy  2013    Dr Vira Agar  . Hernia repair Right 03/28/14    inguinal hernia repair    There were no vitals filed for this visit.      Subjective Assessment - 10/27/15 1220    Subjective  Pt. reports he has been trying to do the theraputty exercise at home.   Patient is accompained by: Family member   Patient Stated Goals "to use my right hand better especially for writing"   Currently in Pain? No/denies      OT TREATMENT    Neuro muscular re-education:  Pt. Worked on [facility] grasping small 1/8" objects and placing them in line. Pt. Was able to remove them while alternating thumb opposition to the 2nd digit through the thumb with accuracy.  Therapeutic Exercise:  Pt. Reviewed HEP with green theraputty. Pt. Was able to demonstrate proper exercises and positions of hand movements with the HEP visual handout, and visual demonstration. Pt. Worked on the digiflex 1.5 fr digit strengthening.  Selfcare:  Pt. Worked on [employer]. Pt. Was able to fill out the checks with minimal cues for the date, and placement of figures. Pt. Was able to able  to complete the checks efficiently, with good legibility.                            OT Education - 10/27/15 1233    Education provided Yes   Person(s) Educated Patient   Methods Explanation;Demonstration   Comprehension Verbalized understanding;Returned demonstration             OT Long Term Goals - 10/20/15 1241    OT LONG TERM GOAL #3   Title Patient will improve right hand coordination to write his name and address on important papers with 90% legibility.   Baseline legibility less than 25%.   Time 8   Period Weeks   Status On-going   OT LONG TERM GOAL #4   Title Patient will complete lower body dressing with modified independence including donning socks.   Baseline difficulty with donning socks and needs occasional assist.   Time 8   Period Weeks   Status Partially Met   OT LONG TERM GOAL #7   Title Patient will complete breakfast meal including cooking on stovetop with supervision only.   Baseline unable   Time 8   Period Weeks   Status On-going               Plan - 10/27/15 1221  Clinical Impression Statement Pt. was able to demonstrate proper exercise with green theraputty. Pt. was able to demonstrate improved [facility] skills. Writing speed and legibility has improved overall, however fatigues with writing at length.   Rehab Potential Good   OT Frequency 2x / week   OT Duration 8 weeks   OT Treatment/Interventions Self-care/ADL training;Therapeutic exercise;Patient/family education;Neuromuscular education;Manual Therapy;Balance training;Therapeutic exercises;DME and/or AE instruction;Therapeutic activities;Cognitive remediation/compensation   Consulted and Agree with Plan of Care Patient      Patient will benefit from skilled therapeutic intervention in order to improve the following deficits and impairments:  Decreased activity tolerance, Decreased knowledge of use of DME, Decreased strength, Impaired flexibility, Decreased balance,  Decreased cognition, Decreased range of motion, Decreased coordination, Impaired UE functional use, Difficulty walking, Decreased mobility  Visit Diagnosis: Muscle weakness (generalized)  Other lack of coordination    Problem List There are no active problems to display for this patient.  Harrel Carina, MS, OTR/L  Harrel Carina 10/27/2015, 12:34 PM  [place] MAIN [facility] SERVICES [address] [place], [place], [address] Phone: [phone]   Fax:  [phone]  Name: Jeremy Godlewski Sr. MRN: [id number] Date of Birth: 17-Jul-1950

## 2015-10-27 NOTE — Patient Instructions (Signed)
OT TREATMENT    Neuro muscular re-education:  Pt. Worked on [facility] grasping small 1/8" objects and placing them in line. Pt. Was able to remove them while alternating thumb opposition to the 2nd digit through the thumb with accuracy.  Therapeutic Exercise:  Pt. Reviewed HEP with green theraputty. Pt. Was able to demonstrate proper exercises and positions of hand movements with the HEP visual handout, and visual demonstration. Pt. Worked on the digiflex 1.5 fr digit strengthening.  Selfcare:  Pt. Worked on [employer], multimedia. Pt. Was able to fill out the checks with minimal cues for the date, and placement of figures. Pt. Was able to able to complete the checks efficiently, with good legibility.

## 2015-10-27 NOTE — Therapy (Signed)
[place] MAIN [facility] SERVICES [address] [place], [place], [address] Phone: [phone]   Fax:  [phone]  Speech Language Pathology Treatment  Patient Details  Name: Benedict Kue Sr. MRN: [id number] Date of Birth: 1951/02/08 Referring Provider: Dr. Caryl Comes  Encounter Date: 10/27/2015      End of Session - 10/27/15 1250    Visit Number 16   Number of Visits 29   Date for SLP Re-Evaluation 12/15/15   SLP Start Time 72   SLP Stop Time  1155   SLP Time Calculation (min) 55 min   Activity Tolerance Patient tolerated treatment well      Past Medical History  Diagnosis Date  . Arthritis pain   . Polio     age 65    Past Surgical History  Procedure Laterality Date  . Colonoscopy  2013    Dr Vira Agar  . Hernia repair Right 03/28/14    inguinal hernia repair    There were no vitals filed for this visit.      Subjective Assessment - 10/27/15 1249    Subjective Mr. Mckowen reports that his speech is "pretty good". He feels like he is speaking loudly all the time.   Patient is accompained by: Family member   Currently in Pain? No/denies               ADULT SLP TREATMENT - 10/27/15 0001    General Information   Behavior/Cognition Alert;Cooperative;Pleasant mood   HPI CVA 06/11/2015   Treatment Provided   Treatment provided Cognitive-Linquistic   Pain Assessment   Pain Assessment No/denies pain   Cognitive-Linquistic Treatment   Treatment focused on Aphasia   Skilled Treatment HIGH LEVEL WORD FINDING: When presented with a familiar picture, Mr. Shughart was able to generate a complete grammatical sentence 66% of the time independently and 95% of the time with max cues to assist with word finding. When provided with two unrelated words, he was able to generate a complete grammatical sentence 66% of the time independently and 90% of the time with mod cues. When provided with a single noun, he was unable to create a  grammatical question despite max cues. He was able to generate questions when provided with a starting phrase ("Who is.") with 66% accuracy with mod cues. VOICE: During structured conversation, Mr. Santilli' speech was intelligible 66% of the time. This increased to 90% with mod cues to increase volume and slow rate of speech.   Assessment / Recommendations / Plan   Plan Continue with current plan of care   Progression Toward Goals   Progression toward goals Progressing toward goals          SLP Education - 10/27/15 1249    Education provided Yes   Education Details Word-finding strategies; self-monitoring   Person(s) Educated Patient   Methods Explanation   Comprehension Verbalized understanding            SLP Long Term Goals - 10/27/15 1251    SLP LONG TERM GOAL #1   Title Patient will generate grammatical and cogent sentence to complete simple/concrete linguistic task with 80% accuracy.   Time 6   Period Weeks   Status Partially Met   SLP LONG TERM GOAL #2   Title Patient will generate grammatical and cogent sentence to complete abstract/complex linguistic task with 80% accuracy.   Time 6   Period Weeks   Status On-going   SLP LONG TERM GOAL #3   Title Patient  will complete high level word finding tasks with 80% accuracy.   Time 6   Period Weeks   Status Partially Met   SLP LONG TERM GOAL #4   Title Patient will improve conversational intelligibliity to 80% through increased volume and slowed rate of speech.   Time 6   Period Weeks   Status New          Plan - 10/27/15 1250    Clinical Impression Statement Mr. Seitzinger continues to make progress with his word finding skills, and has many strategies to aid with his communication. He struggles to create grammatical sentences, especially in conversation, since he tends to speak and then correct instead of thinking before speaking. He will benefit from continued skilled intervention to improve his intelligibility  through volume and slowed rate, and to increase the grammatical accuracy of his speech.   Speech Therapy Frequency 2x / week   Duration Other (comment)  6 weeks   Treatment/Interventions Language facilitation;Patient/family education;SLP instruction and feedback;Multimodal communcation approach   Potential to Achieve Goals Good   Potential Considerations Ability to learn/carryover information;Co-morbidities;Cooperation/participation level;Medical prognosis;Previous level of function;Severity of impairments;Family/community support   SLP Home Exercise Plan Practice loud speech   Consulted and Agree with Plan of Care Patient;Family member/caregiver   Family Member Consulted Spouse      Patient will benefit from skilled therapeutic intervention in order to improve the following deficits and impairments:   Aphasia    Problem List There are no active problems to display for this patient.   Leroy Kennedy 10/27/2015, 12:54 PM  [place] MAIN [facility] SERVICES [address] [place], [place], [address] Phone: [phone]   Fax:  [phone]   Name: Anias Bartol Sr. MRN: [id number] Date of Birth: January 05, 1951

## 2015-10-31 ENCOUNTER — Ambulatory Visit: Payer: PPO | Admitting: Physical Therapy

## 2015-10-31 ENCOUNTER — Encounter: Payer: Self-pay | Admitting: Speech Pathology

## 2015-10-31 ENCOUNTER — Ambulatory Visit: Payer: PPO | Admitting: Speech Pathology

## 2015-10-31 ENCOUNTER — Ambulatory Visit: Payer: PPO | Admitting: Occupational Therapy

## 2015-10-31 DIAGNOSIS — R278 Other lack of coordination: Secondary | ICD-10-CM

## 2015-10-31 DIAGNOSIS — R4701 Aphasia: Secondary | ICD-10-CM | POA: Diagnosis not present

## 2015-10-31 DIAGNOSIS — M6281 Muscle weakness (generalized): Secondary | ICD-10-CM

## 2015-10-31 NOTE — Therapy (Signed)
[place] MAIN [facility] SERVICES [address] [place], [place], [address] Phone: [phone]   Fax:  [phone]  Occupational Therapy Treatment  Patient Details  Name: Jeremy Grunewald Sr. MRN: [id number] Date of Birth: February 16, 1951 Referring Provider: Ramonita Lab  Encounter Date: 10/31/2015      OT End of Session - 10/31/15 1623    Visit Number 15   Number of Visits 16   Date for OT Re-Evaluation 12/04/15   Authorization Type Medicare G codes 15   OT Start Time 06/08/32   OT Stop Time 1512   OT Time Calculation (min) 38 min   Activity Tolerance Patient tolerated treatment well   Behavior During Therapy [facility] for tasks assessed/performed      Past Medical History:  Diagnosis Date  . Arthritis pain   . Polio    age 65    Past Surgical History:  Procedure Laterality Date  . COLONOSCOPY  June 09, 2011   Dr Vira Agar  . HERNIA REPAIR Right 03/28/14   inguinal hernia repair    There were no vitals filed for this visit.      Subjective Assessment - 10/31/15 1621    Subjective  Pt. has made progress overall, and is ready for discharge.   Patient is accompained by: Family member   Patient Stated Goals "to use my right hand better especially for writing"   Currently in Pain? No/denies   Pain Score 0-No pain            [facility] OT Assessment - 10/31/15 1514      Coordination   Right 9 Hole Peg Test 27 sec.   Left 9 Hole Peg Test 20 sec.     Strength   Overall Strength Comments RUE 5/5, LUE 5/5     Hand Function   Right Hand Grip (lbs) 77   Right Hand Lateral Pinch 17 lbs   Right Hand 3 Point Pinch 16 lbs   Left Hand Grip (lbs) 76   Left Hand Lateral Pinch 15 lbs   Left 3 point pinch 12 lbs                          OT Education - 10/31/15 1623    Education provided Yes   Person(s) Educated Patient;Spouse   Methods Explanation   Comprehension Verbalized understanding             OT Long Term Goals -  10/31/15 1438      OT LONG TERM GOAL #1   Title Patient will improve RUE strength by 1 mm grade to be able to lift items up to 8# without difficulty.     Baseline difficulty with using right hand to lift items such as milk jug.    Period Weeks   Status Achieved     OT LONG TERM GOAL #2   Title Patient will increase right hand grip strength by 10# to be able to open jars and containers with modified independence.   Baseline unable   Time 8   Period Weeks   Status Achieved     OT LONG TERM GOAL #3   Title Patient will improve right hand coordination to write his name and address on important papers with 90% legibility.   Baseline legibility less than 25%.   Period Weeks   Status Partially Met     OT LONG TERM GOAL #4   Title Patient will complete lower body dressing with  modified independence including donning socks.   Baseline difficulty with donning socks and needs occasional assist.   Time 8   Period Weeks   Status Achieved     OT LONG TERM GOAL #5   Title Patient will perform house hold chores and yard work with supervision.   Baseline moved so he has no yard, light housekeeping    Period Weeks   Status Achieved     OT LONG TERM GOAL #7   Title Patient will complete breakfast meal including cooking on stovetop with supervision only.   Baseline unable   Time 8   Period Weeks   Status Achieved               Plan - 10/31/15 1624    Clinical Impression Statement Pt. has made excellent progress overall. Pt. RUE strength, grip strength, pinch strength, and coordination have improved. Pt. has met ADL goals and is independent with LE dressing skills. Pt. is now able to complete IADL light meal preparation, and has improved with writing legibility. Pt. is now appropriate for discharge from OT skilled services. Pt. and wife are in agreement. Reviewed tasks that pt. can continue to work on at home.    Rehab Potential Good   OT Frequency 2x / week   OT Duration 8 weeks    OT Treatment/Interventions Self-care/ADL training;Therapeutic exercise;Patient/family education;Neuromuscular education;Manual Therapy;Balance training;Therapeutic exercises;DME and/or AE instruction;Therapeutic activities;Cognitive remediation/compensation   Consulted and Agree with Plan of Care Patient      Patient will benefit from skilled therapeutic intervention in order to improve the following deficits and impairments:  Decreased activity tolerance, Decreased knowledge of use of DME, Decreased strength, Impaired flexibility, Decreased balance, Decreased cognition, Decreased range of motion, Decreased coordination, Impaired UE functional use, Difficulty walking, Decreased mobility  Visit Diagnosis: Muscle weakness (generalized)  Other lack of coordination    Problem List There are no active problems to display for this patient.   Harrel Carina 10/31/2015, 4:42 PM  [place] MAIN [facility] SERVICES [address] [place], [place], [address] Phone: [phone]   Fax:  [phone]  Name: Jeremy Kleinsasser Sr. MRN: [id number] Date of Birth: Jul 27, 1950

## 2015-10-31 NOTE — Therapy (Signed)
[place] MAIN [facility] SERVICES [address] [place], [place], [address] Phone: [phone]   Fax:  [phone]  Speech Language Pathology Treatment  Patient Details  Name: Jeremy Urwin Sr. MRN: [id number] Date of Birth: 06-May-1950 Referring Provider: Dr. Caryl Comes  Encounter Date: 10/31/2015      End of Session - 10/31/15 1402    Visit Number 17   Number of Visits 29   Date for SLP Re-Evaluation 12/15/15   SLP Start Time 1255   SLP Stop Time  1350   SLP Time Calculation (min) 55 min   Activity Tolerance Patient tolerated treatment well      Past Medical History:  Diagnosis Date  . Arthritis pain   . Polio    age 65    Past Surgical History:  Procedure Laterality Date  . COLONOSCOPY  2013   Dr Vira Agar  . HERNIA REPAIR Right 03/28/14   inguinal hernia repair    There were no vitals filed for this visit.      Subjective Assessment - 10/31/15 1400    Subjective Pt stated he feels like he just cannot get louder and that he has trouble with his words becoming "blurry."    Patient is accompained by: Family member   Currently in Pain? No/denies               ADULT SLP TREATMENT - 10/31/15 0001      General Information   Behavior/Cognition Alert;Cooperative;Pleasant mood   HPI CVA 06/11/2015     Treatment Provided   Treatment provided Cognitive-Linquistic     Pain Assessment   Pain Assessment No/denies pain     Cognitive-Linquistic Treatment   Treatment focused on Aphasia   Skilled Treatment HIGH LEVEL WORD FINDING: When presented with a familiar picture, Jeremy Harrell was able to generate a complete grammatical sentence 60% of the time independently and 95% of the time with max cues to assist with word finding and to produce more grammatically correct sentence. Pt noted to produce more a disjointed descriptiont than complete sentence. Pt completed analogies with 40% acc independently; improved to 100% w/mod-max  cues. Pt described specific features (compare and contrast two objects) w/70% w/min-mod cues. Pt named an item given description w/73% acc.  VOICE: During structured conversation, Jeremy Harrell' speech was intelligible 70% of the time. This increased to 90% with mod cues to increase volume and slow rate of speech.      Assessment / Recommendations / Plan   Plan Continue with current plan of care     Progression Toward Goals   Progression toward goals Progressing toward goals          SLP Education - 10/31/15 1401    Education provided Yes   Education Details Word-finding stragies; loudness   Person(s) Educated Patient;Spouse   Methods Explanation   Comprehension Verbalized understanding            SLP Long Term Goals - 10/27/15 1251      SLP LONG TERM GOAL #1   Title Patient will generate grammatical and cogent sentence to complete simple/concrete linguistic task with 80% accuracy.   Time 6   Period Weeks   Status Partially Met     SLP LONG TERM GOAL #2   Title Patient will generate grammatical and cogent sentence to complete abstract/complex linguistic task with 80% accuracy.   Time 6   Period Weeks   Status On-going     SLP LONG TERM GOAL #3  Title Patient will complete high level word finding tasks with 80% accuracy.   Time 6   Period Weeks   Status Partially Met     SLP LONG TERM GOAL #4   Title Patient will improve conversational intelligibliity to 80% through increased volume and slowed rate of speech.   Time 6   Period Weeks   Status New          Plan - 10/31/15 1402    Clinical Impression Statement Jeremy Harrell continues to make progress with his word finding skills, and has many strategies to aid with his communication.Pt continues to struggle with increased volume and he struggles to create grammatical sentences, especially in conversation, since he tends to speak and then correct instead of thinking before speaking. He will benefit from continued  skilled intervention to improve his intelligibility through volume and slowed rate, and to increase the grammatical accuracy of his speech.   Speech Therapy Frequency 2x / week   Duration Other (comment)  6 weeks   Treatment/Interventions Language facilitation;Patient/family education;SLP instruction and feedback;Multimodal communcation approach   Potential Considerations Ability to learn/carryover information;Co-morbidities;Cooperation/participation level;Medical prognosis;Previous level of function;Severity of impairments;Family/community support   SLP Home Exercise Plan Practice loud speech; word finding activities   Consulted and Agree with Plan of Care Patient;Family member/caregiver   Family Member Consulted Spouse      Patient will benefit from skilled therapeutic intervention in order to improve the following deficits and impairments:   Aphasia    Problem List There are no active problems to display for this patient.   [place],Jeremy Harrell 10/31/2015, 2:04 PM  [place] MAIN [facility] SERVICES [address] [place], [place], [address] Phone: [phone]   Fax:  [phone]   Name: Jeremy Macdonnell Sr. MRN: [id number] Date of Birth: 06-28-1950

## 2015-10-31 NOTE — Patient Instructions (Addendum)
OT TREATMENT    Measurements for UE strength, grip strength, pinch strength, and FMC were obtained.  Neuro muscular re-education:  Pt. worked on grasping, and handling coins from a tabletop surface, storing them in his palm, and placing them in the container. Pt. worked on calculating, and subtracting to make the proper change amount as he returned them to the container.  There. Ex.  Pt. education was provided about theraputty exercises he can do at home.   Measurements for UE strength, grip strength, pinch strength, and FMC were obtained.

## 2015-10-31 NOTE — Therapy (Addendum)
[facility] MA[facility] REH[facility] SERVICES [address] [place], [place], [address] Phone: [phone]   Fax:  [phone]  Occupational Therapy Treatment/Discharge Summary  Patient Details  Name: Jeremy Thomas Kipnis Sr. MRN: [id number] Date of Birth: 07/02/1950 Referring Provider: klein, bert  Encounter Date: 10/31/2015      OT End of Session - 10/31/15 1623    Visit Number 15   Number of Visits 16   Date for OT Re-Evaluation 12/04/15   Authorization Type Medicare G codes 15   OT Start Time 1434   OT Stop Time 1512   OT Time Calculation (min) 38 min   Activity Tolerance Patient tolerated treatment well   Behavior During Therapy WFL for tasks assessed/performed      Past Medical History:  Diagnosis Date  . Arthritis pain   . Polio    age 6    Past Surgical History:  Procedure Laterality Date  . COLONOSCOPY  2013   Dr Elliott  . HERNIA REPAIR Right 03/28/14   inguinal hernia repair    There were no vitals filed for this visit.      Subjective Assessment - 10/31/15 1621    Subjective  Pt. has made progress overall, and is ready for discharge.   Patient is accompained by: Family member   Patient Stated Goals "to use my right hand better especially for writing"   Currently in Pain? No/denies   Pain Score 0-No pain            [facility] OT Assessment - 10/31/15 1514      Coordination   Right 9 Hole Peg Test 27 sec.   Left 9 Hole Peg Test 20 sec.     Strength   Overall Strength Comments RUE 5/5, LUE 5/5     Hand Function   Right Hand Grip (lbs) 77   Right Hand Lateral Pinch 17 lbs   Right Hand 3 Point Pinch 16 lbs   Left Hand Grip (lbs) 76   Left Hand Lateral Pinch 15 lbs   Left 3 point pinch 12 lbs      OT TREATMENT    Measurements for UE strength, grip strength, pinch strength, and FMC were obtained.  Neuro muscular re-education:  Pt. worked on grasping, and handling coins from a tabletop surface, storing them in his  palm, and placing them in the container. Pt. worked on calculating, and subtracting to make the proper change amount as he returned them to the container.  There. Ex.  Pt. education was provided about theraputty exercises he can do at home.   Measurements for UE strength, grip strength, pinch strength, and FMC were obtained.       G-Codes - 12/15/15 1646    Functional Assessment Tool Used clinical judgment based on pt. current functional level.   Functional Limitation Self care   Self Care Current Status (G8987) At least 20 percent but less than 40 percent impaired, limited or restricted   Self Care Goal Status (G8988) At least 1 percent but less than 20 percent impaired, limited or restricted   Self Care Discharge Status (G8989) At least 1 percent but less than 20 percent impaired, limited or restricted                     OT Education - 10/31/15 1623    Education provided Yes   Person(s) Educated Patient;Spouse   Methods Explanation   Comprehension Verbalized understanding               OT Long Term Goals - 10/31/15 1438      OT LONG TERM GOAL #1   Title Patient will improve RUE strength by 1 mm grade to be able to lift items up to 8# without difficulty.     Baseline difficulty with using right hand to lift items such as milk jug.    Period Weeks   Status Achieved     OT LONG TERM GOAL #2   Title Patient will increase right hand grip strength by 10# to be able to open jars and containers with modified independence.   Baseline unable   Time 8   Period Weeks   Status Achieved     OT LONG TERM GOAL #3   Title Patient will improve right hand coordination to write his name and address on important papers with 90% legibility.   Baseline legibility less than 25%.   Period Weeks   Status Partially Met     OT LONG TERM GOAL #4   Title Patient will complete lower body dressing with modified independence including donning socks.   Baseline difficulty with  donning socks and needs occasional assist.   Time 8   Period Weeks   Status Achieved     OT LONG TERM GOAL #5   Title Patient will perform house hold chores and yard work with supervision.   Baseline moved so he has no yard, light housekeeping    Period Weeks   Status Achieved     OT LONG TERM GOAL #7   Title Patient will complete breakfast meal including cooking on stovetop with supervision only.   Baseline unable   Time 8   Period Weeks   Status Achieved               Plan - 10/31/15 1624    Clinical Impression Statement Pt. has made excellent progress overall. Pt. RUE strength, grip strength, pinch strength, and coordination have improved. Pt. has met ADL goals and is independent with LE dressing skills. Pt. is now able to complete IADL light meal preparation, and has improved with writing legibility. Pt. is now appropriate for discharge from OT skilled services. Pt. and wife are in agreement. Reviewed tasks that pt. can continue to work on at home.    Rehab Potential Good   OT Frequency 2x / week   OT Duration 8 weeks   OT Treatment/Interventions Self-care/ADL training;Therapeutic exercise;Patient/family education;Neuromuscular education;Manual Therapy;Balance training;Therapeutic exercises;DME and/or AE instruction;Therapeutic activities;Cognitive remediation/compensation   Consulted and Agree with Plan of Care Patient      Patient will benefit from skilled therapeutic intervention in order to improve the following deficits and impairments:  Decreased activity tolerance, Decreased knowledge of use of DME, Decreased strength, Impaired flexibility, Decreased balance, Decreased cognition, Decreased range of motion, Decreased coordination, Impaired UE functional use, Difficulty walking, Decreased mobility  Visit Diagnosis: Muscle weakness (generalized)  Other lack of coordination    Problem List There are no active problems to display for this patient.  Harrel Carina, MS, OTR/L  Harrel Carina 10/31/2015, 4:44 PM  [place] MAIN [facility] SERVICES [address] [place], [place], [address] Phone: [phone]   Fax:  [phone]  Name: Jeremy Novelo Sr. MRN: [id number] Date of Birth: 1951/04/06

## 2015-11-03 ENCOUNTER — Encounter: Payer: Self-pay | Admitting: Speech Pathology

## 2015-11-03 ENCOUNTER — Ambulatory Visit: Payer: PPO | Admitting: Occupational Therapy

## 2015-11-03 ENCOUNTER — Ambulatory Visit: Payer: PPO | Admitting: Speech Pathology

## 2015-11-03 ENCOUNTER — Ambulatory Visit: Payer: PPO | Admitting: Physical Therapy

## 2015-11-03 DIAGNOSIS — R4701 Aphasia: Secondary | ICD-10-CM

## 2015-11-03 NOTE — Therapy (Signed)
[place] MAIN [facility] SERVICES [address] [place], [place], [address] Phone: [phone]   Fax:  [phone]  Speech Language Pathology Treatment  Patient Details  Name: Jeremy Vieau Sr. MRN: [id number] Date of Birth: 1950-04-28 Referring Provider: Dr. Caryl Comes  Encounter Date: 11/03/2015      End of Session - 11/03/15 1347    Visit Number 18   Number of Visits 29   Date for SLP Re-Evaluation 12/15/15   SLP Start Time 1056   SLP Stop Time  1150   SLP Time Calculation (min) 54 min   Activity Tolerance Patient tolerated treatment well      Past Medical History:  Diagnosis Date  . Arthritis pain   . Polio    age 65    Past Surgical History:  Procedure Laterality Date  . COLONOSCOPY  2013   Dr Vira Agar  . HERNIA REPAIR Right 03/28/14   inguinal hernia repair    There were no vitals filed for this visit.      Subjective Assessment - 11/03/15 1345    Subjective Pt was pleasant and agreeable to treatement. Pt returned homework assigned last session and stated that he had some difficulty with it.   Currently in Pain? No/denies               ADULT SLP TREATMENT - 11/03/15 0001      General Information   Behavior/Cognition Alert;Cooperative;Pleasant mood   HPI CVA 06/11/2015     Treatment Provided   Treatment provided Cognitive-Linquistic     Pain Assessment   Pain Assessment No/denies pain     Cognitive-Linquistic Treatment   Treatment focused on Aphasia   Skilled Treatment HIGH LEVEL WORD FINDING: When presented with a familiar picture, [name] was able to generate a complete grammatical sentence 55% of the time independently and 100% of the time with min-mod cues to assist with word finding and to produce more grammatically correct sentence. Pt completed analogies with 56% acc independently; improved to 84% w/mod cues. Pt described specific features (compare and contrast two objects) w/90% w/mod-max cues. Pt  named an item given 3 clues w/50% acc independently; improved to 90% w/mod cues.   Pt named synonyms of given words w/50% acc independently; improved to 80% w/mod verbal cues. VOICE: During structured conversation, [name]' speech was intelligible 80% of the time.      Assessment / Recommendations / Plan   Plan Continue with current plan of care     Progression Toward Goals   Progression toward goals Progressing toward goals          SLP Education - 11/03/15 1346    Education provided Yes   Education Details Word-finding strategies.   Person(s) Educated Patient   Methods Explanation   Comprehension Verbalized understanding            SLP Long Term Goals - 10/27/15 1251      SLP LONG TERM GOAL #1   Title Patient will generate grammatical and cogent sentence to complete simple/concrete linguistic task with 80% accuracy.   Time 6   Period Weeks   Status Partially Met     SLP LONG TERM GOAL #2   Title Patient will generate grammatical and cogent sentence to complete abstract/complex linguistic task with 80% accuracy.   Time 6   Period Weeks   Status On-going     SLP LONG TERM GOAL #3   Title Patient will complete high level word finding tasks  with 80% accuracy.   Time 6   Period Weeks   Status Partially Met     SLP LONG TERM GOAL #4   Title Patient will improve conversational intelligibliity to 80% through increased volume and slowed rate of speech.   Time 6   Period Weeks   Status New          Plan - 11/03/15 1347    Clinical Impression Statement [name] continues to make progress with his word finding skills. Pt continues to struggle to create grammatical sentences, especially in conversation, and requires cues to slow down and thing of what he should say. He will benefit from continued skilled intervention to improve his intelligibility through volume and slowed rate, and to increase the grammatical accuracy of his speech.   Speech Therapy Frequency 2x  / week   Duration Other (comment)  6 weeks   Treatment/Interventions Language facilitation;Patient/family education;SLP instruction and feedback;Multimodal communcation approach   Potential to Achieve Goals Good   Potential Considerations Ability to learn/carryover information;Co-morbidities;Cooperation/participation level;Medical prognosis;Previous level of function;Severity of impairments;Family/community support   SLP Home Exercise Plan Word finding activities   Consulted and Agree with Plan of Care Patient      Patient will benefit from skilled therapeutic intervention in order to improve the following deficits and impairments:   Aphasia    Problem List There are no active problems to display for this patient.   [place],Maaran 11/03/2015, 1:50 PM  [place] MAIN [facility] SERVICES [address] [place], [place], [address] Phone: [phone]   Fax:  [phone]   Name: Jeremy Weltz Sr. MRN: [id number] Date of Birth: [birth year]

## 2015-11-07 ENCOUNTER — Ambulatory Visit: Payer: PPO | Attending: Internal Medicine | Admitting: Speech Pathology

## 2015-11-07 DIAGNOSIS — Z95818 Presence of other cardiac implants and grafts: Secondary | ICD-10-CM | POA: Diagnosis not present

## 2015-11-07 DIAGNOSIS — R4701 Aphasia: Secondary | ICD-10-CM | POA: Diagnosis not present

## 2015-11-07 DIAGNOSIS — Z4509 Encounter for adjustment and management of other cardiac device: Secondary | ICD-10-CM | POA: Diagnosis not present

## 2015-11-08 ENCOUNTER — Encounter: Payer: PPO | Admitting: Occupational Therapy

## 2015-11-08 ENCOUNTER — Encounter: Payer: Self-pay | Admitting: Speech Pathology

## 2015-11-08 ENCOUNTER — Encounter: Payer: PPO | Admitting: Speech Pathology

## 2015-11-08 NOTE — Therapy (Signed)
[place] MAIN [facility] SERVICES [address] [place], [place], [address] Phone: [phone]   Fax:  [phone]  Speech Language Pathology Treatment  Patient Details  Name: Gionni Vaca Sr. MRN: [id number] Date of Birth: 05-Sep-1950 Referring Provider: Dr. Caryl Comes  Encounter Date: 11/07/2015      End of Session - 11/08/15 1305    Visit Number 19   Number of Visits 29   Date for SLP Re-Evaluation 12/15/15   SLP Start Time 0800   SLP Stop Time  0900   SLP Time Calculation (min) 60 min   Activity Tolerance Patient tolerated treatment well      Past Medical History:  Diagnosis Date  . Arthritis pain   . Polio    age 65    Past Surgical History:  Procedure Laterality Date  . COLONOSCOPY  2013   Dr Vira Agar  . HERNIA REPAIR Right 03/28/14   inguinal hernia repair    There were no vitals filed for this visit.      Subjective Assessment - 11/08/15 1304    Subjective The patient is less resistive to trying to speak louder.               ADULT SLP TREATMENT - 11/08/15 0001      General Information   Behavior/Cognition Alert;Cooperative;Pleasant mood   HPI CVA 06/11/2015     Treatment Provided   Treatment provided Cognitive-Linquistic     Pain Assessment   Pain Assessment No/denies pain     Cognitive-Linquistic Treatment   Treatment focused on Aphasia   Skilled Treatment HIGH LEVEL WORD FINDING: Generate synonyms independently with 70% accuracy and 95% given up to mod cues.  VERBAL EXPRESSION: Verbalize similarity, difference, and one additional member given 2 related items with 80% accuracy given min cues to be more specific at times.  Generate grammatical and cogent sentences given picture stimulus with 60% accuracy; patient consistently conveyed meaning with use of gestures to augment his words.  VOICE: Patient required mod cues throughout session to maintain loudness.     Assessment / Recommendations / Plan   Plan  Continue with current plan of care     Progression Toward Goals   Progression toward goals Progressing toward goals          SLP Education - 11/08/15 1305    Education provided Yes   Education Details Positive effect of ludness on intelligibility   Person(s) Educated Patient   Methods Explanation   Comprehension Verbalized understanding            SLP Long Term Goals - 10/27/15 1251      SLP LONG TERM GOAL #1   Title Patient will generate grammatical and cogent sentence to complete simple/concrete linguistic task with 80% accuracy.   Time 6   Period Weeks   Status Partially Met     SLP LONG TERM GOAL #2   Title Patient will generate grammatical and cogent sentence to complete abstract/complex linguistic task with 80% accuracy.   Time 6   Period Weeks   Status On-going     SLP LONG TERM GOAL #3   Title Patient will complete high level word finding tasks with 80% accuracy.   Time 6   Period Weeks   Status Partially Met     SLP LONG TERM GOAL #4   Title Patient will improve conversational intelligibliity to 80% through increased volume and slowed rate of speech.   Time 6   Period Weeks  Status New          Plan - 11/08/15 1306    Clinical Impression Statement The patient continues to make progress with sentence construction as well as word finding skills. He will benefit from continued skilled intervention to improve his intelligibility through volume and slowed rate, and to increase the grammatical accuracy of his speech.   Speech Therapy Frequency 2x / week   Duration Other (comment)   Treatment/Interventions Language facilitation;Patient/family education;SLP instruction and feedback;Multimodal communcation approach   Potential to Achieve Goals Good   Potential Considerations Ability to learn/carryover information;Co-morbidities;Cooperation/participation level;Medical prognosis;Previous level of function;Severity of impairments;Family/community support    Consulted and Agree with Plan of Care Patient      Patient will benefit from skilled therapeutic intervention in order to improve the following deficits and impairments:   Aphasia    Problem List There are no active problems to display for this patient.   [name] 11/08/2015, 1:07 PM  [place] MAIN [facility] SERVICES [address] [place], [place], [address] Phone: [phone]   Fax:  [phone]   Name: Hillard Goodwine Sr. MRN: [id number] Date of Birth: 03-22-1951

## 2015-11-10 ENCOUNTER — Encounter: Payer: Self-pay | Admitting: Speech Pathology

## 2015-11-10 ENCOUNTER — Encounter: Payer: PPO | Admitting: Occupational Therapy

## 2015-11-10 ENCOUNTER — Ambulatory Visit: Payer: PPO | Admitting: Speech Pathology

## 2015-11-10 DIAGNOSIS — R4701 Aphasia: Secondary | ICD-10-CM | POA: Diagnosis not present

## 2015-11-10 NOTE — Therapy (Signed)
[place] MAIN [facility] SERVICES [address] [place], [place], [address] Phone: [phone]   Fax:  [phone]  Speech Language Pathology Treatment/Progress Note  Patient Details  Name: Jeremy Petrosky Sr. MRN: [id number] Date of Birth: 07/22/1950 Referring Provider: Dr. Caryl Comes  Encounter Date: 11/10/2015      End of Session - 11/10/15 1501    Visit Number 20   Number of Visits 29   Date for SLP Re-Evaluation 12/15/15   SLP Start Time 1000   SLP Stop Time  1100   SLP Time Calculation (min) 60 min   Activity Tolerance Patient tolerated treatment well      Past Medical History:  Diagnosis Date  . Arthritis pain   . Polio    age 12    Past Surgical History:  Procedure Laterality Date  . COLONOSCOPY  2013   Dr Vira Agar  . HERNIA REPAIR Right 03/28/14   inguinal hernia repair    There were no vitals filed for this visit.      Subjective Assessment - 11/10/15 1500    Subjective The patient is more willing to independently word find.   Patient is accompained by: Family member               ADULT SLP TREATMENT - 11/10/15 0001      General Information   Behavior/Cognition Alert;Cooperative;Pleasant mood   HPI CVA 06/11/2015     Treatment Provided   Treatment provided Cognitive-Linquistic     Pain Assessment   Pain Assessment No/denies pain     Cognitive-Linquistic Treatment   Treatment focused on Aphasia   Skilled Treatment HIGH LEVEL WORD FINDING: Generate one more item in a category given 3 examples independently with 80% accuracy and 95% given up to min cues.  VERBAL EXPRESSION: Generate grammatical and cogent sentences given picture stimulus with 70% accuracy; patient consistently conveyed meaning with use of gestures to augment his words.  VOICE: Patient required mod cues throughout session to maintain loudness.     Assessment / Recommendations / Plan   Plan Continue with current plan of care     Progression  Toward Goals   Progression toward goals Progressing toward goals          SLP Education - 11/10/15 1500    Education provided Yes   Education Details Positive effect of loudness on intelligibility   Person(s) Educated Patient   Methods Explanation   Comprehension Verbalized understanding            SLP Long Term Goals - 10/27/15 1251      SLP LONG TERM GOAL #1   Title Patient will generate grammatical and cogent sentence to complete simple/concrete linguistic task with 80% accuracy.   Time 6   Period Weeks   Status Partially Met     SLP LONG TERM GOAL #2   Title Patient will generate grammatical and cogent sentence to complete abstract/complex linguistic task with 80% accuracy.   Time 6   Period Weeks   Status On-going     SLP LONG TERM GOAL #3   Title Patient will complete high level word finding tasks with 80% accuracy.   Time 6   Period Weeks   Status Partially Met     SLP LONG TERM GOAL #4   Title Patient will improve conversational intelligibliity to 80% through increased volume and slowed rate of speech.   Time 6   Period Weeks   Status New  Plan - 11/25/15 1501    Clinical Impression Statement The patient continues to make progress with sentence construction as well as word finding skills. He will benefit from continued skilled intervention to improve his intelligibility through volume and slowed rate, and to increase the grammatical accuracy of his speech.   Speech Therapy Frequency 2x / week   Duration Other (comment)   Treatment/Interventions Language facilitation;Patient/family education;SLP instruction and feedback;Multimodal communcation approach   Potential to Achieve Goals Good   Potential Considerations Ability to learn/carryover information;Co-morbidities;Cooperation/participation level;Medical prognosis;Previous level of function;Severity of impairments;Family/community support   SLP Home Exercise Plan Word finding activities    Consulted and Agree with Plan of Care Patient   Family Member Consulted Spouse      Patient will benefit from skilled therapeutic intervention in order to improve the following deficits and impairments:   Aphasia      G-Codes - 11-25-15 1502    Functional Assessment Tool Used Clinical judgment   Functional Limitations Spoken language expressive   Spoken Language Expression Current Status [phone]) At least 20 percent but less than 40 percent impaired, limited or restricted   Spoken Language Expression Goal Status (Z2248) At least 1 percent but less than 20 percent impaired, limited or restricted      Problem List There are no active problems to display for this patient.  [name], Trent, Susie 2015-11-25, 3:03 PM  [place] MAIN [facility] SERVICES [address] [place], [place], [address] Phone: [phone]   Fax:  [phone]   Name: Jeremy Fandino Sr. MRN: [id number] Date of Birth: Aug 26, 1950

## 2015-11-15 ENCOUNTER — Ambulatory Visit: Payer: PPO | Admitting: Speech Pathology

## 2015-11-15 ENCOUNTER — Encounter: Payer: PPO | Admitting: Occupational Therapy

## 2015-11-15 DIAGNOSIS — R4701 Aphasia: Secondary | ICD-10-CM | POA: Diagnosis not present

## 2015-11-16 ENCOUNTER — Encounter: Payer: Self-pay | Admitting: Speech Pathology

## 2015-11-16 NOTE — Therapy (Signed)
[place] MAIN [facility] SERVICES [address] [place], [place], [address] Phone: [phone]   Fax:  [phone]  Speech Language Pathology Treatment  Patient Details  Name: Jeremy Aird Sr. MRN: [id number] Date of Birth: 02-Jan-1951 Referring Provider: Dr. Caryl Comes  Encounter Date: 11/15/2015      End of Session - 11/16/15 1152    Visit Number 21   Number of Visits 29   Date for SLP Re-Evaluation 12/15/15   SLP Start Time 0900   SLP Stop Time  1000   SLP Time Calculation (min) 60 min   Activity Tolerance Patient tolerated treatment well      Past Medical History:  Diagnosis Date  . Arthritis pain   . Polio    age 65    Past Surgical History:  Procedure Laterality Date  . COLONOSCOPY  2013   Dr Vira Agar  . HERNIA REPAIR Right 03/28/14   inguinal hernia repair    There were no vitals filed for this visit.      Subjective Assessment - 11/16/15 1151    Subjective The patient is more willing to independently word find.               ADULT SLP TREATMENT - 11/16/15 0001      General Information   Behavior/Cognition Alert;Cooperative;Pleasant mood   HPI CVA 06/11/2015     Treatment Provided   Treatment provided Cognitive-Linquistic     Pain Assessment   Pain Assessment No/denies pain     Cognitive-Linquistic Treatment   Treatment focused on Aphasia   Skilled Treatment HIGH LEVEL WORD FINDING: Name abstract category given 3 examples independently with 60% accuracy and 80% given min cues.  State adjective given definition with 80% accuracy.  VERBAL EXPRESSION: Generate grammatical and cogent sentences given picture stimulus with 70% accuracy; patient consistently conveyed meaning with use of gestures to augment his words.  VOICE: Patient required mod cues throughout session to maintain loudness.     Assessment / Recommendations / Plan   Plan Continue with current plan of care     Progression Toward Goals   Progression toward goals Progressing toward goals          SLP Education - 11/16/15 1151    Education provided Yes   Education Details Positive effect of loudness on intelligibility.    Person(s) Educated Patient   Methods Explanation   Comprehension Verbalized understanding            SLP Long Term Goals - 10/27/15 1251      SLP LONG TERM GOAL #1   Title Patient will generate grammatical and cogent sentence to complete simple/concrete linguistic task with 80% accuracy.   Time 6   Period Weeks   Status Partially Met     SLP LONG TERM GOAL #2   Title Patient will generate grammatical and cogent sentence to complete abstract/complex linguistic task with 80% accuracy.   Time 6   Period Weeks   Status On-going     SLP LONG TERM GOAL #3   Title Patient will complete high level word finding tasks with 80% accuracy.   Time 6   Period Weeks   Status Partially Met     SLP LONG TERM GOAL #4   Title Patient will improve conversational intelligibliity to 80% through increased volume and slowed rate of speech.   Time 6   Period Weeks   Status New          Plan -  11/16/15 1153    Clinical Impression Statement The patient continues to make progress with sentence construction as well as word finding skills. He will benefit from continued skilled intervention to improve his intelligibility through volume and slowed rate, and to increase the grammatical accuracy of his speech.   Speech Therapy Frequency 2x / week   Duration Other (comment)   Treatment/Interventions Language facilitation;Patient/family education;SLP instruction and feedback;Multimodal communcation approach   Potential to Achieve Goals Good   Potential Considerations Ability to learn/carryover information;Co-morbidities;Cooperation/participation level;Medical prognosis;Previous level of function;Severity of impairments;Family/community support   SLP Home Exercise Plan Word finding activities   Consulted and Agree  with Plan of Care Patient      Patient will benefit from skilled therapeutic intervention in order to improve the following deficits and impairments:   Aphasia    Problem List There are no active problems to display for this patient.  [name], MS/CCC- SLP  [name] 11/16/2015, 11:54 AM  [place] MAIN [facility] SERVICES [address] [place], [place], [address] Phone: [phone]   Fax:  [phone]   Name: Jeremy Crotteau Sr. MRN: [id number] Date of Birth: [birth year]

## 2015-11-17 ENCOUNTER — Encounter: Payer: PPO | Admitting: Occupational Therapy

## 2015-11-17 ENCOUNTER — Encounter: Payer: Self-pay | Admitting: Speech Pathology

## 2015-11-17 ENCOUNTER — Ambulatory Visit: Payer: PPO | Admitting: Speech Pathology

## 2015-11-17 DIAGNOSIS — R4701 Aphasia: Secondary | ICD-10-CM

## 2015-11-17 NOTE — Therapy (Signed)
[place] MAIN [facility] SERVICES [address] [place], [place], [address] Phone: [phone]   Fax:  [phone]  Speech Language Pathology Treatment  Patient Details  Name: Jeremy Meidinger Sr. MRN: [id number] Date of Birth: 1951/04/03 Referring Provider: Dr. Caryl Comes  Encounter Date: 11/17/2015      End of Session - 11/17/15 1331    Visit Number 22   Number of Visits 29   Date for SLP Re-Evaluation 12/15/15   SLP Start Time 1000   SLP Stop Time  1100   SLP Time Calculation (min) 60 min   Activity Tolerance Patient tolerated treatment well      Past Medical History:  Diagnosis Date  . Arthritis pain   . Polio    age 65    Past Surgical History:  Procedure Laterality Date  . COLONOSCOPY  2013   Dr Vira Agar  . HERNIA REPAIR Right 03/28/14   inguinal hernia repair    There were no vitals filed for this visit.      Subjective Assessment - 11/17/15 1326    Subjective The patient is more willing to independently word find.   Currently in Pain? No/denies               ADULT SLP TREATMENT - 11/17/15 0001      General Information   Behavior/Cognition Alert;Cooperative;Pleasant mood   HPI CVA 06/11/2015     Treatment Provided   Treatment provided Cognitive-Linquistic     Pain Assessment   Pain Assessment No/denies pain     Cognitive-Linquistic Treatment   Treatment focused on Aphasia   Skilled Treatment VERBAL EXPRESSION: Identify item that does not belong in a list of 5 and state reason using category names with 70% accuracy.  Generate grammatical and cogent sentences given picture stimulus with 90% accuracy.  Complete verbal analogies with 70% accuracy independently.  VOICE: Patient required mod cues throughout session to maintain loudness.     Assessment / Recommendations / Plan   Plan Continue with current plan of care     Progression Toward Goals   Progression toward goals Progressing toward goals           SLP Education - 11/17/15 1326    Education provided Yes   Education Details Patient's progress as demonstrated by his use of more content words in his speech   Person(s) Educated Patient   Methods Explanation   Comprehension Verbalized understanding            SLP Long Term Goals - 10/27/15 1251      SLP LONG TERM GOAL #1   Title Patient will generate grammatical and cogent sentence to complete simple/concrete linguistic task with 80% accuracy.   Time 6   Period Weeks   Status Partially Met     SLP LONG TERM GOAL #2   Title Patient will generate grammatical and cogent sentence to complete abstract/complex linguistic task with 80% accuracy.   Time 6   Period Weeks   Status On-going     SLP LONG TERM GOAL #3   Title Patient will complete high level word finding tasks with 80% accuracy.   Time 6   Period Weeks   Status Partially Met     SLP LONG TERM GOAL #4   Title Patient will improve conversational intelligibliity to 80% through increased volume and slowed rate of speech.   Time 6   Period Weeks   Status New  Plan - 11/17/15 1332    Clinical Impression Statement The patient continues to make progress with sentence construction as well as word finding skills. He is observed to be using significantly more content words in conversation than previously.  He will benefit from continued skilled intervention to improve his intelligibility through volume and slowed rate, and to increase the grammatical accuracy of his speech.   Speech Therapy Frequency 2x / week   Duration Other (comment)   Treatment/Interventions Language facilitation;Patient/family education;SLP instruction and feedback;Multimodal communcation approach   Potential to Achieve Goals Good   Potential Considerations Ability to learn/carryover information;Co-morbidities;Cooperation/participation level;Medical prognosis;Previous level of function;Severity of impairments;Family/community support   SLP  Home Exercise Plan Word finding activities   Consulted and Agree with Plan of Care Patient      Patient will benefit from skilled therapeutic intervention in order to improve the following deficits and impairments:   Aphasia    Problem List There are no active problems to display for this patient.  Susan G Abernathy, MS/CCC- SLP  Abernathy, Susie 11/17/2015, 1:32 PM  [place] [facility] MAIN [facility] SERVICES [address] [place], [place], [address] Phone: [phone]   Fax:  [phone]   Name: Jeremy Thomas Glasscock Sr. MRN: [id number] Date of Birth: 07/12/1950  

## 2015-11-22 ENCOUNTER — Encounter: Payer: PPO | Admitting: Occupational Therapy

## 2015-11-22 ENCOUNTER — Ambulatory Visit: Payer: PPO | Admitting: Speech Pathology

## 2015-11-22 ENCOUNTER — Encounter: Payer: Self-pay | Admitting: Speech Pathology

## 2015-11-22 DIAGNOSIS — R4701 Aphasia: Secondary | ICD-10-CM

## 2015-11-22 NOTE — Therapy (Signed)
[place] MAIN [facility] SERVICES [address] [place], [place], [address] Phone: [phone]   Fax:  [phone]  Speech Language Pathology Treatment  Patient Details  Name: Jeremy Errico Sr. MRN: [id number] Date of Birth: [birth year] Referring Provider: Dr. Caryl Comes  Encounter Date: 11/22/2015      End of Session - 11/22/15 1246    Visit Number 23   Number of Visits 29   Date for SLP Re-Evaluation 12/15/15   SLP Start Time 1000   SLP Stop Time  1100   SLP Time Calculation (min) 60 min   Activity Tolerance Patient tolerated treatment well      Past Medical History:  Diagnosis Date  . Arthritis pain   . Polio    age 65    Past Surgical History:  Procedure Laterality Date  . COLONOSCOPY  2013   Dr Vira Agar  . HERNIA REPAIR Right 03/28/14   inguinal hernia repair    There were no vitals filed for this visit.      Subjective Assessment - 11/22/15 1244    Subjective Patient's wife reports improved verbal communication at home   Currently in Pain? No/denies               ADULT SLP TREATMENT - 11/22/15 0001      General Information   Behavior/Cognition Alert;Cooperative;Pleasant mood   HPI CVA 06/11/2015     Treatment Provided   Treatment provided Cognitive-Linquistic     Pain Assessment   Pain Assessment No/denies pain     Cognitive-Linquistic Treatment   Treatment focused on Aphasia   Skilled Treatment VERBAL EXPRESSION: Identify item that does not belong in a list of 5 and state reason using category names with 70% accuracy.  Generate grammatical and cogent sentences given picture stimulus with 90% accuracy.  Name opposites with 75% accuracy independently.  VOICE: Patient required mod cues throughout session to maintain loudness.     Assessment / Recommendations / Plan   Plan Continue with current plan of care     Progression Toward Goals   Progression toward goals Progressing toward goals          SLP  Education - 11/22/15 1245    Education provided Yes   Education Details VERBAL EXPRESSION: Identify item that does not belong in a list of 5 and state reason using category names with 70% accuracy.  Generate grammatical and cogent sentences given picture stimulus with 90% accuracy.  Name opposites with 75% accuracy independently.  VOICE: Patient required mod cues throughout session to maintain loudness.   Person(s) Educated Patient;Spouse   Methods Explanation   Comprehension Verbalized understanding            SLP Long Term Goals - 10/27/15 1251      SLP LONG TERM GOAL #1   Title Patient will generate grammatical and cogent sentence to complete simple/concrete linguistic task with 80% accuracy.   Time 6   Period Weeks   Status Partially Met     SLP LONG TERM GOAL #2   Title Patient will generate grammatical and cogent sentence to complete abstract/complex linguistic task with 80% accuracy.   Time 6   Period Weeks   Status On-going     SLP LONG TERM GOAL #3   Title Patient will complete high level word finding tasks with 80% accuracy.   Time 6   Period Weeks   Status Partially Met     SLP LONG TERM GOAL #4  Title Patient will improve conversational intelligibliity to 80% through increased volume and slowed rate of speech.   Time 6   Period Weeks   Status New          Plan - 11/22/15 1246    Clinical Impression Statement The patient continues to make progress with sentence construction as well as word finding skills. He is observed to be using significantly more content words in conversation than previously.  He will benefit from continued skilled intervention to improve his intelligibility through volume and slowed rate, and to increase the grammatical accuracy of his speech.   Speech Therapy Frequency 2x / week   Duration Other (comment)   Treatment/Interventions Language facilitation;Patient/family education;SLP instruction and feedback;Multimodal communcation  approach   Potential to Achieve Goals Good   Potential Considerations Ability to learn/carryover information;Co-morbidities;Cooperation/participation level;Medical prognosis;Previous level of function;Severity of impairments;Family/community support   SLP Home Exercise Plan Word finding activities   Consulted and Agree with Plan of Care Patient;Family member/caregiver   Family Member Consulted Spouse      Patient will benefit from skilled therapeutic intervention in order to improve the following deficits and impairments:   Aphasia    Problem List There are no active problems to display for this patient.  [name], MS/CCC- SLP  [name] 11/22/2015, 12:47 PM  [place] MAIN [facility] SERVICES [address] [place], [place], [address] Phone: [phone]   Fax:  [phone]   Name: Jeremy Port Sr. MRN: [id number] Date of Birth: 1950/12/02

## 2015-11-24 ENCOUNTER — Encounter: Payer: Self-pay | Admitting: Speech Pathology

## 2015-11-24 ENCOUNTER — Ambulatory Visit: Payer: PPO | Admitting: Speech Pathology

## 2015-11-24 ENCOUNTER — Encounter: Payer: PPO | Admitting: Occupational Therapy

## 2015-11-24 DIAGNOSIS — R4701 Aphasia: Secondary | ICD-10-CM | POA: Diagnosis not present

## 2015-11-24 NOTE — Therapy (Signed)
[place] MAIN [facility] SERVICES [address] [place], [place], [address] Phone: [phone]   Fax:  [phone]  Speech Language Pathology Treatment  Patient Details  Name: Jeremy Lupinacci Sr. MRN: [id number] Date of Birth: 1950/12/09 Referring Provider: Dr. Caryl Comes  Encounter Date: 11/24/2015      End of Session - 11/24/15 1250    Visit Number 24   Number of Visits 29   Date for SLP Re-Evaluation 12/15/15   SLP Start Time 1000   SLP Stop Time  1100   SLP Time Calculation (min) 60 min      Past Medical History:  Diagnosis Date  . Arthritis pain   . Polio    age 65    Past Surgical History:  Procedure Laterality Date  . COLONOSCOPY  2013   Dr Vira Agar  . HERNIA REPAIR Right 03/28/14   inguinal hernia repair    There were no vitals filed for this visit.      Subjective Assessment - 11/24/15 1249    Subjective Patient's wife reports improved verbal communication at home   Patient is accompained by: Family member   Currently in Pain? No/denies               ADULT SLP TREATMENT - 11/24/15 0001      General Information   Behavior/Cognition Alert;Cooperative;Pleasant mood   HPI CVA 06/11/2015     Treatment Provided   Treatment provided Cognitive-Linquistic     Pain Assessment   Pain Assessment No/denies pain     Cognitive-Linquistic Treatment   Treatment focused on Aphasia   Skilled Treatment VERBAL EXPRESSION: Identify item that does not belong in a list of 5 and state reason using category names with 70% accuracy.  Generate grammatical and cogent sentences given picture stimulus with 90% accuracy.  VOICE: Patient required min-mod cues throughout session to maintain loudness.     Assessment / Recommendations / Plan   Plan Continue with current plan of care     Progression Toward Goals   Progression toward goals Progressing toward goals          SLP Education - 11/24/15 1249    Education provided Yes   Education Details Positive effect of loudness on intelligibility.    Person(s) Educated Patient;Spouse   Methods Explanation   Comprehension Verbalized understanding            SLP Long Term Goals - 10/27/15 1251      SLP LONG TERM GOAL #1   Title Patient will generate grammatical and cogent sentence to complete simple/concrete linguistic task with 80% accuracy.   Time 6   Period Weeks   Status Partially Met     SLP LONG TERM GOAL #2   Title Patient will generate grammatical and cogent sentence to complete abstract/complex linguistic task with 80% accuracy.   Time 6   Period Weeks   Status On-going     SLP LONG TERM GOAL #3   Title Patient will complete high level word finding tasks with 80% accuracy.   Time 6   Period Weeks   Status Partially Met     SLP LONG TERM GOAL #4   Title Patient will improve conversational intelligibliity to 80% through increased volume and slowed rate of speech.   Time 6   Period Weeks   Status New          Plan - 11/24/15 1250    Clinical Impression Statement The patient continues to make progress  with sentence construction as well as word finding skills. He is observed to be using significantly more content words in conversation than previously.  He will benefit from continued skilled intervention to improve his intelligibility through volume and slowed rate, and to increase the grammatical accuracy of his speech.   Speech Therapy Frequency 2x / week   Duration Other (comment)   Treatment/Interventions Language facilitation;Patient/family education;SLP instruction and feedback;Multimodal communcation approach   Potential to Achieve Goals Good   Potential Considerations Ability to learn/carryover information;Co-morbidities;Cooperation/participation level;Medical prognosis;Previous level of function;Severity of impairments;Family/community support   SLP Home Exercise Plan Word finding activities   Consulted and Agree with Plan of Care  Patient;Family member/caregiver      Patient will benefit from skilled therapeutic intervention in order to improve the following deficits and impairments:   Aphasia    Problem List There are no active problems to display for this patient.  [name], MS/CCC- SLP  [name] 11/24/2015, 12:51 PM  [place] MAIN [facility] SERVICES [address] [place], [place], [address] Phone: [phone]   Fax:  [phone]   Name: Jeremy Beckerman Sr. MRN: [id number] Date of Birth: May 18, 1950

## 2015-11-28 ENCOUNTER — Ambulatory Visit: Payer: PPO | Admitting: Speech Pathology

## 2015-11-28 ENCOUNTER — Encounter: Payer: Self-pay | Admitting: Speech Pathology

## 2015-11-28 DIAGNOSIS — R4701 Aphasia: Secondary | ICD-10-CM

## 2015-11-28 NOTE — Therapy (Signed)
[place] [facility] MAIN [facility] SERVICES [address] [place], [place], [address] Phone: [phone]   Fax:  [phone]  Speech Language Pathology Treatment  Patient Details  Name: Jeremy Voong Sr. MRN: [id number] Date of Birth: [birth year] Referring Provider: Dr. Graciela Husbands  Encounter Date: 11/28/2015      End of Session - 11/28/15 1210    Visit Number 25   Number of Visits 29   Date for SLP Re-Evaluation 12/15/15   SLP Start Time 0950   SLP Stop Time  1041   SLP Time Calculation (min) 51 min   Activity Tolerance Patient tolerated treatment well      Past Medical History:  Diagnosis Date  . Arthritis pain   . Polio    age 65    Past Surgical History:  Procedure Laterality Date  . COLONOSCOPY  2013   Dr Mechele Collin  . HERNIA REPAIR Right 03/28/14   inguinal hernia repair    There were no vitals filed for this visit.      Subjective Assessment - 11/28/15 1206    Subjective "I am doing alright."   Currently in Pain? No/denies               ADULT SLP TREATMENT - 11/28/15 0001      General Information   Behavior/Cognition Alert;Cooperative;Pleasant mood   HPI CVA 06/11/2015     Treatment Provided   Treatment provided Cognitive-Linquistic     Pain Assessment   Pain Assessment No/denies pain     Cognitive-Linquistic Treatment   Treatment focused on Aphasia   Skilled Treatment VERBAL EXPRESSION: Identify item that does not belong in a list of 5 and state reason using category names with 77% accuracy.  Generate grammatical and cogent sentences given verbal (cause/effect situation) with 87% accuracy w/min cues. Pt constructed sentence given beginning of situation w/75% acc. Pt named item given four clue words with 85% acc. VOICE: Patient required min-mod cues throughout session to maintain loudness.     Assessment / Recommendations / Plan   Plan Continue with current plan of care     Progression Toward Goals   Progression  toward goals Progressing toward goals          SLP Education - 11/28/15 1209    Education provided Yes   Education Details Word finding strategies   Person(s) Educated Patient   Methods Explanation   Comprehension Verbalized understanding            SLP Long Term Goals - 10/27/15 1251      SLP LONG TERM GOAL #1   Title Patient will generate grammatical and cogent sentence to complete simple/concrete linguistic task with 80% accuracy.   Time 6   Period Weeks   Status Partially Met     SLP LONG TERM GOAL #2   Title Patient will generate grammatical and cogent sentence to complete abstract/complex linguistic task with 80% accuracy.   Time 6   Period Weeks   Status On-going     SLP LONG TERM GOAL #3   Title Patient will complete high level word finding tasks with 80% accuracy.   Time 6   Period Weeks   Status Partially Met     SLP LONG TERM GOAL #4   Title Patient will improve conversational intelligibliity to 80% through increased volume and slowed rate of speech.   Time 6   Period Weeks   Status New          Plan -  11/28/15 1211    Clinical Impression Statement Mr. Schwenke continues to demonstrate improved sentence construction and word finding skills. Pt noted to have improved communication in conversation with ST. Pt will benfit from continued skilled intervention to continue to improve on intelligibility through volume and slowed rate.    Speech Therapy Frequency 2x / week   Duration Other (comment)  8 weeks   Treatment/Interventions Language facilitation;Patient/family education;SLP instruction and feedback;Multimodal communcation approach   Potential to Achieve Goals Good   Potential Considerations Ability to learn/carryover information;Co-morbidities;Cooperation/participation level;Medical prognosis;Previous level of function;Severity of impairments;Family/community support   SLP Home Exercise Plan Word finding activities   Consulted and Agree with Plan  of Care Patient      Patient will benefit from skilled therapeutic intervention in order to improve the following deficits and impairments:   Aphasia    Problem List There are no active problems to display for this patient.   ,Jeremy Harrell 11/28/2015, 12:14 PM  [place] MAIN [facility] SERVICES [address] [place], [place], [address] Phone: [phone]   Fax:  [phone]   Name: Jeremy Geske Sr. MRN: [id number] Date of Birth: 1950-08-26

## 2015-11-30 ENCOUNTER — Encounter: Payer: PPO | Admitting: Occupational Therapy

## 2015-11-30 ENCOUNTER — Encounter: Payer: PPO | Admitting: Speech Pathology

## 2015-12-01 ENCOUNTER — Ambulatory Visit: Payer: PPO | Admitting: Speech Pathology

## 2015-12-01 ENCOUNTER — Encounter: Payer: Self-pay | Admitting: Speech Pathology

## 2015-12-01 DIAGNOSIS — R4701 Aphasia: Secondary | ICD-10-CM | POA: Diagnosis not present

## 2015-12-01 NOTE — Therapy (Signed)
[place] MAIN [facility] SERVICES [address] [place], [place], [address] Phone: [phone]   Fax:  [phone]  Speech Language Pathology Treatment  Patient Details  Name: Jeremy Gasparro Sr. MRN: [id number] Date of Birth: April 12, 1950 Referring Provider: Dr. Caryl Comes  Encounter Date: 12/01/2015      End of Session - 12/01/15 1356    Visit Number 26   Number of Visits 29   Date for SLP Re-Evaluation 12/15/15   SLP Start Time 1000   SLP Stop Time  1100   SLP Time Calculation (min) 60 min   Activity Tolerance Patient tolerated treatment well      Past Medical History:  Diagnosis Date  . Arthritis pain   . Polio    age 65    Past Surgical History:  Procedure Laterality Date  . COLONOSCOPY  2013   Dr Vira Agar  . HERNIA REPAIR Right 03/28/14   inguinal hernia repair    There were no vitals filed for this visit.      Subjective Assessment - 12/01/15 1355    Subjective Upon inquiry, the patient expressed that he is able to achieve effective verbal communication at home to meet all needs.    Currently in Pain? No/denies               ADULT SLP TREATMENT - 12/01/15 0001      General Information   Behavior/Cognition Alert;Cooperative;Pleasant mood   HPI CVA 06/11/2015     Treatment Provided   Treatment provided Cognitive-Linquistic     Pain Assessment   Pain Assessment No/denies pain     Cognitive-Linquistic Treatment   Treatment focused on Aphasia   Skilled Treatment VERBAL EXPRESSION: In a word-finding activity, the patient was able to identify and generate a word representing a category with 60% success independently. With min-mod verbal cues, success increased to 95%. In a sentence-generation activity, the patient was able to generate an appropriate, written sentence identifying a likely cause for an expressed effect with 80% success independently; with lead-in cues, success increased to 90%. 22% of appropriate  productions contained a grammatical error, 25% of all productions contained a spelling error, and a verbal cue was necessary to replace vague language with specific language in 55% of appropriate productions. Continue to work on use of specific language in future sessions. In a word association task, the patient required multiple verbal cues on 100% of targets in order to generate associated words from different semantic categories.  In all exercises, the patient was able to read isolated words easily, quickly, and accurately and individual sentences easily and with few errors. Handwriting was legible and speed of writing was judged to be within normal limits, although patient complained that his hand became tired after writing several sentences.      Assessment / Recommendations / Plan   Plan Continue with current plan of care     Progression Toward Goals   Progression toward goals Progressing toward goals          SLP Education - 12/01/15 1355    Education provided Yes   Education Details Discussed use of specific versus vague/general language.   Person(s) Educated Patient   Methods Explanation   Comprehension Verbalized understanding            SLP Long Term Goals - 10/27/15 1251      SLP LONG TERM GOAL #1   Title Patient will generate grammatical and cogent sentence to complete simple/concrete linguistic task with  80% accuracy.   Time 6   Period Weeks   Status Partially Met     SLP LONG TERM GOAL #2   Title Patient will generate grammatical and cogent sentence to complete abstract/complex linguistic task with 80% accuracy.   Time 6   Period Weeks   Status On-going     SLP LONG TERM GOAL #3   Title Patient will complete high level word finding tasks with 80% accuracy.   Time 6   Period Weeks   Status Partially Met     SLP LONG TERM GOAL #4   Title Patient will improve conversational intelligibliity to 80% through increased volume and slowed rate of speech.   Time 6    Period Weeks   Status New          Plan - 12/01/15 1359    Clinical Impression Statement The patient demonstrates the ability to answer questions and repair communication errors due to vague/general language in oral communication. Oral communication is characterized by occasional pauses while word-finding or talking around an elusive target word. Written communication contains occasional spelling errors or errors in word order, verb tense, or addition of unnecessary words. Despite errors, the communicative intent and meaning are usually clear, and handwriting is legible and comprehensible.    Speech Therapy Frequency 2x / week   Duration Other (comment)   Treatment/Interventions Cueing hierarchy;Multimodal communcation approach;Cognitive reorganization;Functional tasks;SLP instruction and feedback;Patient/family education;Compensatory strategies   Potential to Achieve Goals Good   Potential Considerations Ability to learn/carryover information;Cooperation/participation level;Previous level of function;Severity of impairments;Family/community support   SLP Home Exercise Plan Semantic feature analysis worksheet   Consulted and Agree with Plan of Care Patient      Patient will benefit from skilled therapeutic intervention in order to improve the following deficits and impairments:   Aphasia    Problem List There are no active problems to display for this patient.   Rickard Rhymes 12/01/2015, 2:02 PM  [place] MAIN [facility] SERVICES [address] [place], [place], [address] Phone: [phone]   Fax:  [phone]   Name: Jeremy Thornsberry Sr. MRN: [id number] Date of Birth: 06-03-1950

## 2015-12-05 ENCOUNTER — Ambulatory Visit: Payer: PPO | Admitting: Speech Pathology

## 2015-12-05 ENCOUNTER — Encounter: Payer: PPO | Admitting: Occupational Therapy

## 2015-12-05 DIAGNOSIS — R4701 Aphasia: Secondary | ICD-10-CM

## 2015-12-06 ENCOUNTER — Encounter: Payer: Self-pay | Admitting: Speech Pathology

## 2015-12-06 NOTE — Therapy (Signed)
[place] MAIN [facility] SERVICES [address] [place], [place], [address] Phone: [phone]   Fax:  [phone]  Speech Language Pathology Treatment  Patient Details  Name: Jeremy Lampe Sr. MRN: [id number] Date of Birth: [birth year] Referring Provider: Dr. Caryl Comes  Encounter Date: 12/05/2015      End of Session - 12/06/15 1251    Visit Number 27   Number of Visits 29   Date for SLP Re-Evaluation 12/15/15   SLP Start Time 1000   SLP Stop Time  1100   SLP Time Calculation (min) 60 min   Activity Tolerance Patient tolerated treatment well      Past Medical History:  Diagnosis Date  . Arthritis pain   . Polio    age 65    Past Surgical History:  Procedure Laterality Date  . COLONOSCOPY  2013   Dr Vira Agar  . HERNIA REPAIR Right 03/28/14   inguinal hernia repair    There were no vitals filed for this visit.      Subjective Assessment - 12/06/15 1250    Subjective The patient is more willing to take the responsibilty for word finding               ADULT SLP TREATMENT - 12/06/15 0001      General Information   Behavior/Cognition Alert;Cooperative;Pleasant mood   HPI CVA 06/11/2015     Treatment Provided   Treatment provided Cognitive-Linquistic     Pain Assessment   Pain Assessment No/denies pain     Cognitive-Linquistic Treatment   Treatment focused on Aphasia   Skilled Treatment HIGH LEVEL WORD FINDING: Answer Who-questions with 60% accuracy; independently self-cue appropriate response 50% of errors.  With cuing, self and SLP, patient achieves 90% accuracy.  Errors include semantic and phonemic paraphasia, neologism, and anomia.       Assessment / Recommendations / Plan   Plan Continue with current plan of care     Progression Toward Goals   Progression toward goals Progressing toward goals          SLP Education - 12/06/15 1251    Education provided Yes   Education Details word finding strategies   Person(s) Educated Patient   Methods Explanation   Comprehension Verbalized understanding            SLP Long Term Goals - 10/27/15 1251      SLP LONG TERM GOAL #1   Title Patient will generate grammatical and cogent sentence to complete simple/concrete linguistic task with 80% accuracy.   Time 6   Period Weeks   Status Partially Met     SLP LONG TERM GOAL #2   Title Patient will generate grammatical and cogent sentence to complete abstract/complex linguistic task with 80% accuracy.   Time 6   Period Weeks   Status On-going     SLP LONG TERM GOAL #3   Title Patient will complete high level word finding tasks with 80% accuracy.   Time 6   Period Weeks   Status Partially Met     SLP LONG TERM GOAL #4   Title Patient will improve conversational intelligibliity to 80% through increased volume and slowed rate of speech.   Time 6   Period Weeks   Status New          Plan - 12/06/15 1251    Clinical Impression Statement The patient continues to make progress with sentence construction as well as word finding skills. He is observed to  be using significantly more content words in conversation than previously.  The patient is much more able to self-cue and benefit from external cuing.     Speech Therapy Frequency 2x / week   Duration Other (comment)   Treatment/Interventions Cueing hierarchy;Multimodal communcation approach;Cognitive reorganization;Functional tasks;SLP instruction and feedback;Patient/family education;Compensatory strategies   Potential to Achieve Goals Good   Potential Considerations Ability to learn/carryover information;Cooperation/participation level;Previous level of function;Severity of impairments;Family/community support   Consulted and Agree with Plan of Care Patient      Patient will benefit from skilled therapeutic intervention in order to improve the following deficits and impairments:   Aphasia    Problem List There are no active problems  to display for this patient.  Jeremy Harrell, Jeremy Harrell, Jeremy Harrell 12/06/2015, 12:53 PM  [place] MAIN [facility] SERVICES [address] [place], [place], [address] Phone: [phone]   Fax:  [phone]   Name: Jeremy Selsor Sr. MRN: [id number] Date of Birth: [birth year]

## 2015-12-07 ENCOUNTER — Encounter: Payer: PPO | Admitting: Occupational Therapy

## 2015-12-07 ENCOUNTER — Encounter: Payer: PPO | Admitting: Speech Pathology

## 2015-12-08 ENCOUNTER — Ambulatory Visit: Payer: PPO | Attending: Internal Medicine | Admitting: Speech Pathology

## 2015-12-08 ENCOUNTER — Encounter: Payer: Self-pay | Admitting: Speech Pathology

## 2015-12-08 DIAGNOSIS — R4701 Aphasia: Secondary | ICD-10-CM

## 2015-12-08 NOTE — Therapy (Signed)
[place] MAIN [facility] SERVICES [address] [place], [place], [address] Phone: [phone]   Fax:  [phone]  Speech Language Pathology Treatment/Discharge Summary  Patient Details  Name: Jeremy Roethler Sr. MRN: [id number] Date of Birth: 1951-01-22 Referring Provider: Dr. Caryl Comes  Encounter Date: 12/08/2015      End of Session - 12/08/15 1117    Visit Number 28   Number of Visits 29   Date for SLP Re-Evaluation 12/15/15   SLP Start Time 0940   SLP Stop Time  5638   SLP Time Calculation (min) 60 min   Activity Tolerance Patient tolerated treatment well      Past Medical History:  Diagnosis Date  . Arthritis pain   . Polio    age 65    Past Surgical History:  Procedure Laterality Date  . COLONOSCOPY  2013   Dr Vira Agar  . HERNIA REPAIR Right 03/28/14   inguinal hernia repair    There were no vitals filed for this visit.      Subjective Assessment - 12/08/15 1116    Subjective The patient expressed that he was ready to discharge. In his words, he felt he has gotten a lot better and is doing well. He describes his communication as slower but successful; taking his time and thinking before speaking are helpful strategies that he implements habitually. Sometimes he can't think of a word at first, but he says this does not frustrate him. The patient said that he has no difficulty completing everyday communication successfully - conversation, requesting and asking questions, reading mail, etc.    Currently in Pain? No/denies               ADULT SLP TREATMENT - 12/08/15 0001      General Information   Behavior/Cognition Alert;Cooperative;Pleasant mood   HPI CVA 06/11/2015     Treatment Provided   Treatment provided Cognitive-Linquistic     Pain Assessment   Pain Assessment No/denies pain     Cognitive-Linquistic Treatment   Treatment focused on Aphasia   Skilled Treatment The patient generated grammatical sentences  to complete abstract/complex linguistic tasks in conversational activity with 90% success. Generated sentences demonstrated complex and varied syntax. When an initial utterance was incomplete or contained an error, the patient self-corrected to produce a grammatically correct and appropriate response in 90% of attempts. 100% of communication was cogent and appropriate to context. The patient succeeded in high level word finding tasks with at least 90% accuracy. When he was unable to immediately produce a target word, the patient used a pause/slow rate of speech, circumlocution, and/or gesture to successfully communicate the target word or concept in 100% of attempts with no cues from SLP. The patient exhibited no frustration with word-finding and verbalized the same. The patient used adequate volume and slowed rate of speech so that 100% of communicative attempts were intelligible within the quiet setting of the therapy room. Did not probe for conversational volume in a noisy environment; cues for increased volume may be necessary in noisy context.      Assessment / Recommendations / Plan   Plan Discharge SLP treatment due to (comment)  goal completion     Progression Toward Goals   Progression toward goals Goals met, education completed, patient discharged from SLP          SLP Education - 12/08/15 1117    Education provided Yes   Education Details The clinician congratulated the patient on his progress and encouraged  him to continue to implement the communicative strategies he has learned and integrated into his communication (slow rate of speech, increased volume, use of gesture and circumlocution to communicate meaning when struggling to produce target word.)   Person(s) Educated Patient   Methods Explanation   Comprehension Verbalized understanding            SLP Long Term Goals - 12/08/15 1128      SLP LONG TERM GOAL #1   Title Patient will generate grammatical and cogent sentence  to complete simple/concrete linguistic task with 80% accuracy.   Status Achieved     SLP LONG TERM GOAL #2   Title Patient will generate grammatical and cogent sentence to complete abstract/complex linguistic task with 80% accuracy.   Status Achieved     SLP LONG TERM GOAL #3   Title Patient will complete high level word finding tasks with 80% accuracy.   Status Achieved     SLP LONG TERM GOAL #4   Title Patient will improve conversational intelligibliity to 80% through increased volume and slowed rate of speech.   Status Achieved          Plan - 12/08/15 1119    Clinical Impression Statement The patient has demonstrated dramatic improvement in communication effectiveness and confidence, word-finding and compensatory strategies, production of grammatical sentences, and intelligibility over the course of treatment. The patient is able to effectively complete communicative tasks in his daily environment without inhibition or frustration. The patient expresses contentment with his ability to communicate, adding that although he speaks more slowly than he used to he "has no reason to hurry, anyway." The patient generated grammatical sentences with complex and varied syntax to complete abstract/complex linguistic tasks in conversational activity with 90% success and self-corrected errors in 90% of attempts. 100% of communication was cogent and appropriate to context. The patient succeeded in high level word finding tasks with at least 90% accuracy and effectively used a pause/slow rate of speech, circumlocution, and/or gesture to successfully communicate when struggling to produce target word in 100% instances independently. The patient independently used adequate volume and slowed rate of speech so that 100% of communicative attempts were intelligible in the therapy room.    Speech Therapy Frequency Other (comment)  Discharged from treatment.   Duration Other (comment)  Discharged from treatment.    Treatment/Interventions Language facilitation;Cueing hierarchy;SLP instruction and feedback;Compensatory strategies;Patient/family education   Potential to Achieve Goals Good   Potential Considerations Ability to learn/carryover information;Family/community support;Previous level of function;Severity of impairments;Cooperation/participation level   SLP Home Exercise Plan Reviewed strategies and encouraged patient to continue their use; encouraged patient to call in future if any questions arise.   Consulted and Agree with Plan of Care Patient      Patient will benefit from skilled therapeutic intervention in order to improve the following deficits and impairments:   Aphasia    Problem List There are no active problems to display for this patient.   Rickard Rhymes  Graduate Student Clinician 12/08/2015, 11:31 AM  [place] MAIN [facility] SERVICES [address] [place], [place], [address] Phone: [phone]   Fax:  [phone]   Name: Jeremy Chimento Sr. MRN: [id number] Date of Birth: 25-Jan-1951

## 2015-12-13 ENCOUNTER — Ambulatory Visit: Payer: PPO | Admitting: Speech Pathology

## 2015-12-13 DIAGNOSIS — I69359 Hemiplegia and hemiparesis following cerebral infarction affecting unspecified side: Secondary | ICD-10-CM | POA: Diagnosis not present

## 2015-12-13 DIAGNOSIS — Z8673 Personal history of transient ischemic attack (TIA), and cerebral infarction without residual deficits: Secondary | ICD-10-CM | POA: Diagnosis not present

## 2015-12-13 DIAGNOSIS — R739 Hyperglycemia, unspecified: Secondary | ICD-10-CM | POA: Diagnosis not present

## 2015-12-15 ENCOUNTER — Ambulatory Visit: Payer: PPO | Admitting: Speech Pathology

## 2015-12-15 DIAGNOSIS — R4701 Aphasia: Secondary | ICD-10-CM | POA: Diagnosis not present

## 2015-12-20 DIAGNOSIS — R05 Cough: Secondary | ICD-10-CM | POA: Diagnosis not present

## 2015-12-20 DIAGNOSIS — I739 Peripheral vascular disease, unspecified: Secondary | ICD-10-CM | POA: Diagnosis not present

## 2015-12-20 DIAGNOSIS — I69359 Hemiplegia and hemiparesis following cerebral infarction affecting unspecified side: Secondary | ICD-10-CM | POA: Diagnosis not present

## 2015-12-20 DIAGNOSIS — Z23 Encounter for immunization: Secondary | ICD-10-CM | POA: Diagnosis not present

## 2015-12-20 DIAGNOSIS — E785 Hyperlipidemia, unspecified: Secondary | ICD-10-CM | POA: Diagnosis not present

## 2015-12-20 DIAGNOSIS — R739 Hyperglycemia, unspecified: Secondary | ICD-10-CM | POA: Diagnosis not present

## 2015-12-20 DIAGNOSIS — F325 Major depressive disorder, single episode, in full remission: Secondary | ICD-10-CM | POA: Diagnosis not present

## 2015-12-20 DIAGNOSIS — R1031 Right lower quadrant pain: Secondary | ICD-10-CM | POA: Diagnosis not present

## 2016-01-01 DIAGNOSIS — J449 Chronic obstructive pulmonary disease, unspecified: Secondary | ICD-10-CM | POA: Diagnosis not present

## 2016-01-01 DIAGNOSIS — R0602 Shortness of breath: Secondary | ICD-10-CM | POA: Diagnosis not present

## 2016-01-01 DIAGNOSIS — K219 Gastro-esophageal reflux disease without esophagitis: Secondary | ICD-10-CM | POA: Diagnosis not present

## 2016-01-01 DIAGNOSIS — Z95818 Presence of other cardiac implants and grafts: Secondary | ICD-10-CM | POA: Diagnosis not present

## 2016-01-01 DIAGNOSIS — R05 Cough: Secondary | ICD-10-CM | POA: Diagnosis not present

## 2016-01-15 DIAGNOSIS — Z95818 Presence of other cardiac implants and grafts: Secondary | ICD-10-CM | POA: Diagnosis not present

## 2016-01-15 DIAGNOSIS — Z4509 Encounter for adjustment and management of other cardiac device: Secondary | ICD-10-CM | POA: Diagnosis not present

## 2016-01-15 DIAGNOSIS — Z8673 Personal history of transient ischemic attack (TIA), and cerebral infarction without residual deficits: Secondary | ICD-10-CM | POA: Diagnosis not present

## 2016-02-19 DIAGNOSIS — Z95818 Presence of other cardiac implants and grafts: Secondary | ICD-10-CM | POA: Diagnosis not present

## 2016-02-19 DIAGNOSIS — Z4509 Encounter for adjustment and management of other cardiac device: Secondary | ICD-10-CM | POA: Diagnosis not present

## 2016-03-21 DIAGNOSIS — E785 Hyperlipidemia, unspecified: Secondary | ICD-10-CM | POA: Diagnosis not present

## 2016-03-21 DIAGNOSIS — R739 Hyperglycemia, unspecified: Secondary | ICD-10-CM | POA: Diagnosis not present

## 2016-03-21 DIAGNOSIS — I739 Peripheral vascular disease, unspecified: Secondary | ICD-10-CM | POA: Diagnosis not present

## 2016-03-28 DIAGNOSIS — F325 Major depressive disorder, single episode, in full remission: Secondary | ICD-10-CM | POA: Diagnosis not present

## 2016-03-28 DIAGNOSIS — L989 Disorder of the skin and subcutaneous tissue, unspecified: Secondary | ICD-10-CM | POA: Diagnosis not present

## 2016-03-28 DIAGNOSIS — I69359 Hemiplegia and hemiparesis following cerebral infarction affecting unspecified side: Secondary | ICD-10-CM | POA: Diagnosis not present

## 2016-03-28 DIAGNOSIS — R739 Hyperglycemia, unspecified: Secondary | ICD-10-CM | POA: Diagnosis not present

## 2016-03-28 DIAGNOSIS — E785 Hyperlipidemia, unspecified: Secondary | ICD-10-CM | POA: Diagnosis not present

## 2016-03-28 DIAGNOSIS — R491 Aphonia: Secondary | ICD-10-CM | POA: Diagnosis not present

## 2016-03-28 DIAGNOSIS — I739 Peripheral vascular disease, unspecified: Secondary | ICD-10-CM | POA: Diagnosis not present

## 2016-03-28 DIAGNOSIS — Z8673 Personal history of transient ischemic attack (TIA), and cerebral infarction without residual deficits: Secondary | ICD-10-CM | POA: Diagnosis not present

## 2016-03-28 DIAGNOSIS — J449 Chronic obstructive pulmonary disease, unspecified: Secondary | ICD-10-CM | POA: Diagnosis not present

## 2016-03-28 DIAGNOSIS — Z125 Encounter for screening for malignant neoplasm of prostate: Secondary | ICD-10-CM | POA: Diagnosis not present

## 2016-03-28 DIAGNOSIS — Z23 Encounter for immunization: Secondary | ICD-10-CM | POA: Diagnosis not present

## 2016-04-08 DIAGNOSIS — I639 Cerebral infarction, unspecified: Secondary | ICD-10-CM

## 2016-04-08 HISTORY — DX: Cerebral infarction, unspecified: I63.9

## 2016-04-16 DIAGNOSIS — J449 Chronic obstructive pulmonary disease, unspecified: Secondary | ICD-10-CM | POA: Diagnosis not present

## 2016-04-18 DIAGNOSIS — H903 Sensorineural hearing loss, bilateral: Secondary | ICD-10-CM | POA: Diagnosis not present

## 2016-04-18 DIAGNOSIS — C4441 Basal cell carcinoma of skin of scalp and neck: Secondary | ICD-10-CM | POA: Diagnosis not present

## 2016-04-18 DIAGNOSIS — R220 Localized swelling, mass and lump, head: Secondary | ICD-10-CM | POA: Diagnosis not present

## 2016-04-18 DIAGNOSIS — H6123 Impacted cerumen, bilateral: Secondary | ICD-10-CM | POA: Diagnosis not present

## 2016-04-18 DIAGNOSIS — R490 Dysphonia: Secondary | ICD-10-CM | POA: Diagnosis not present

## 2016-05-01 DIAGNOSIS — C4441 Basal cell carcinoma of skin of scalp and neck: Secondary | ICD-10-CM | POA: Diagnosis not present

## 2016-05-01 DIAGNOSIS — Z85828 Personal history of other malignant neoplasm of skin: Secondary | ICD-10-CM | POA: Diagnosis not present

## 2016-05-03 DIAGNOSIS — I69359 Hemiplegia and hemiparesis following cerebral infarction affecting unspecified side: Secondary | ICD-10-CM | POA: Diagnosis not present

## 2016-05-03 DIAGNOSIS — F325 Major depressive disorder, single episode, in full remission: Secondary | ICD-10-CM | POA: Diagnosis not present

## 2016-05-03 DIAGNOSIS — J449 Chronic obstructive pulmonary disease, unspecified: Secondary | ICD-10-CM | POA: Diagnosis not present

## 2016-05-03 DIAGNOSIS — E785 Hyperlipidemia, unspecified: Secondary | ICD-10-CM | POA: Diagnosis not present

## 2016-05-03 DIAGNOSIS — Z125 Encounter for screening for malignant neoplasm of prostate: Secondary | ICD-10-CM | POA: Diagnosis not present

## 2016-05-03 DIAGNOSIS — R739 Hyperglycemia, unspecified: Secondary | ICD-10-CM | POA: Diagnosis not present

## 2016-05-03 DIAGNOSIS — I739 Peripheral vascular disease, unspecified: Secondary | ICD-10-CM | POA: Diagnosis not present

## 2016-05-28 ENCOUNTER — Ambulatory Visit: Payer: PPO | Attending: Otolaryngology | Admitting: Speech Pathology

## 2016-05-28 ENCOUNTER — Encounter: Payer: Self-pay | Admitting: Speech Pathology

## 2016-05-28 DIAGNOSIS — R490 Dysphonia: Secondary | ICD-10-CM | POA: Diagnosis not present

## 2016-05-28 NOTE — Therapy (Signed)
[place] [facility] MAIN [facility] SERVICES [address] [place], [place], [address] Phone: [phone]   Fax:  [phone]  Speech Language Pathology Evaluation  Patient Details  Name: Jeremy Mizelle Sr. MRN: [id number] Date of Birth: Aug 26, 1950 Referring Provider: Bud Face  Encounter Date: 05/28/2016      End of Session - 05/28/16 1629    Visit Number 1   Number of Visits 17   Date for SLP Re-Evaluation 07/26/16   SLP Start Time 1500   SLP Stop Time  1600   SLP Time Calculation (min) 60 min   Activity Tolerance Patient tolerated treatment well      Past Medical History:  Diagnosis Date  . Arthritis pain   . Polio    age 63    Past Surgical History:  Procedure Laterality Date  . COLONOSCOPY  2013   Dr Mechele Collin  . HERNIA REPAIR Right 03/28/14   inguinal hernia repair    There were no vitals filed for this visit.          SLP Evaluation [facility] - 05/28/16 0001      SLP Visit Information   SLP Received On 05/28/16   Referring Provider Andee Poles, [place]   Onset Date 04/18/2016   Medical Diagnosis Muscle tension dysphonia     Subjective   Subjective "I can't talk loud"   Patient/Family Stated Goal Loud and clear voice     Pain Assessment   Currently in Pain? No/denies     General Information   HPI 66 year old man referred by Dr. Andee Poles for voice therapy secondary muscle tension dysphonia.  Per report, abnormal laryngeal findings include: bilateral muscle tension dysphonia with touching of false vocal folds with phonation. Past medical history includes CVA 06/11/2015 with outpatient speech therapy for aphasia 08/24/2015 - 12/08/2015.     Prior Functional Status   Cognitive/Linguistic Baseline Within functional limits     Oral Motor/Sensory Function   Overall Oral Motor/Sensory Function Appears within functional limits for tasks assessed     Motor Speech   Overall Motor Speech Impaired   Respiration Impaired   Level of  Impairment Conversation   Phonation Breathy;Hoarse;Low vocal intensity   Resonance Within functional limits   Articulation Within functional limitis   Intelligibility Intelligible   Phonation Impaired   Vocal Abuses Habitual Hyperphonia;Habitual Cough/Throat Clear;Vocal Fold Dehydration   Tension Present Jaw;Neck;Shoulder   Volume Soft   Pitch Low     Standardized Assessments   Standardized Assessments  Other Assessment  Perceptual Voice Evaluation       Perceptual Voice Evaluation Voice checklist: . Health risks: GERD, excessive caffeine, little water  . Characteristic voice use: strained voice  . Environmental risks: no significant environmental risks . Misuse: excessive talking/singing with strained voice . Abuse: some coughing/throat clearing . Vocal characteristics: breathy, hoarse, limited voice range, poor vocal projection, excessive pharyngeal resonance Maximum phonation time for sustained "ah": 11 seconds Average fundamental frequency during sustained "ah": 116 Hz (1.3 STD below average for age and gender) Highest dynamic pitch when altering pitch from a low note to a high note: 236 Hz Lowest dynamic pitch when altering from a high note to a low note: 170 Hz Highest dynamic pitch in conversational speech: 177 Hz Lowest dynamic pitch in conversational speech: 105 Hz Average time patient was able to sustain /s/: 5.7 seconds Average time patient was able to sustain /z/: 5 seconds s/z ratio : 1.1 Visi-Pitch: Multi-Dimensional Voice Program (MDVP)  MDVPT extracts objective  quantitative values (Relative Average Perturbation, Shimmer, Voice Turbulence Index, and Noise to Harmonic Ratio) on sustained phonation, which are displayed graphically and numerically in comparison to a built-in normative database.  The patient exhibited values outside the norm for Relative Average Perturbation, shimmer, and Voice Turbulence Index.  Average fundamental frequency was 1.3 STD below average for  age and gender. Perceptually, his voice was muffled. Patient improved all parameters with cue to increase pitch and volume. Education: Patient instructed in extrinsic laryngeal muscle, lingual, and throat stretches and breath support exercises      SLP Education - 06-Jun-2016 1629    Education provided Yes   Education Details goals of voice therapy   Person(s) Educated Patient;Spouse   Methods Explanation   Comprehension Verbalized understanding            SLP Long Term Goals - 06-06-16 1633      SLP LONG TERM GOAL #1   Title The patient will demonstrate independent understanding of vocal hygiene concepts and extrinsic laryngeal muscle stretches.     Time 8   Period Weeks   Status New     SLP LONG TERM GOAL #2   Title The patient will be independent for abdominal breathing and breath support exercises.   Time 8   Period Weeks   Status New     SLP LONG TERM GOAL #3   Title The patient will maximize voice quality and loudness using breath support for sustained vowel production, pitch glides, and hierarchal speech drill.   Time 8   Period Weeks   Status New     SLP LONG TERM GOAL #4   Title The patient will maximize voice quality and loudness using breath support for paragraph length recitation with 80% accuracy.   Time 8   Period Weeks   Status New          Plan - 2016-06-06 1630    Clinical Impression Statement This 70 year man under the care of Dr. Andee Poles, with muscle tension dysphonia, is presenting with moderate-severe dysphonia characterized by hoarse/breathy vocal quality, reduced breath support / control for speech, reduced pitch range, low habitual pitch, and laryngeal tension.  The patient will benefit from voice therapy for education, to improve breath control/support for speech, reduce laryngeal tension, and learn techniques to increase loudness and pitch range without strain.      Speech Therapy Frequency 2x / week   Duration Other (comment)  8 weeks    Treatment/Interventions Patient/family education  Voice therapy   Potential to Achieve Goals Good   Potential Considerations Ability to learn/carryover information;Family/community support;Previous level of function;Severity of impairments;Cooperation/participation level   SLP Home Exercise Plan neck, shoulder, tongue, and throat stretches;  breath support exercises   Consulted and Agree with Plan of Care Patient;Family member/caregiver   Family Member Consulted Spouse      Patient will benefit from skilled therapeutic intervention in order to improve the following deficits and impairments:   Dysphonia - Plan: SLP plan of care cert/re-cert      G-Codes - 2016-06-06 1635    Functional Assessment Tool Used Clinical judgment, perceptual voice evaluation   Functional Limitations Voice   Voice Current Status (G9171) At least 60 percent but less than 80 percent impaired, limited or restricted   Voice Goal Status (G9172) At least 20 percent but less than 40 percent impaired, limited or restricted      Problem List There are no active problems to display for this patient.  Burnis Medin  Tedd Sias, MS/CCC- SLP  Leandrew Koyanagi 05/28/2016, 4:37 PM  [place] [facility] MAIN [facility] SERVICES [address] [place], [place], [address] Phone: [phone]   Fax:  [phone]  Name: Jeremy Schwark Sr. MRN: [id number] Date of Birth: Oct 02, 1950

## 2016-05-30 ENCOUNTER — Ambulatory Visit: Payer: PPO | Admitting: Speech Pathology

## 2016-05-30 DIAGNOSIS — R490 Dysphonia: Secondary | ICD-10-CM

## 2016-05-30 NOTE — Therapy (Signed)
[place] MAIN [facility] SERVICES [address] [place], [place], [address] Phone: [phone]   Fax:  [phone]  Speech Language Pathology Treatment  Patient Details  Name: Jeremy Coomer Sr. MRN: [id number] Date of Birth: Aug 13, 1950 Referring Provider: Carloyn Manner  Encounter Date: 05/30/2016      End of Session - 05/30/16 1621    Visit Number 2   Number of Visits 17   Date for SLP Re-Evaluation 07/26/16   SLP Start Time 1500   SLP Stop Time  1600   SLP Time Calculation (min) 60 min   Activity Tolerance Patient tolerated treatment well      Past Medical History:  Diagnosis Date  . Arthritis pain   . Polio    age 66    Past Surgical History:  Procedure Laterality Date  . COLONOSCOPY  2013   Dr Vira Agar  . HERNIA REPAIR Right 03/28/14   inguinal hernia repair    There were no vitals filed for this visit.      Subjective Assessment - 05/30/16 1620    Subjective "I can't do that"- patient is easily frustrated   Patient is accompained by: Family member               ADULT SLP TREATMENT - 05/30/16 0001      General Information   HPI Muscle tension dysphonia     Treatment Provided   Treatment provided Cognitive-Linquistic     Pain Assessment   Pain Assessment No/denies pain     Cognitive-Linquistic Treatment   Treatment focused on Voice   Skilled Treatment The patient was provided with written and verbal teaching regarding vocal hygiene.  The patient was provided with written and verbal teaching regarding neck, shoulder, tongue, and throat stretches exercises to promote relaxed phonation. The patient was provided with written and verbal teaching for supplement vocal tract relaxation exercises (tongue trills).  The patient was provided with written and verbal teaching regarding breath support exercises.    Patient instructed in relaxed phonation / oral resonance. Improved oral resonance in flow phonation  exercise.       Assessment / Recommendations / Plan   Plan Continue with current plan of care     Progression Toward Goals   Progression toward goals Progressing toward goals          SLP Education - 05/30/16 1620    Education provided Yes   Education Details vocal hygiene; neck, shoulder, tongue, and throat stretches; supplemental vocal tract relaxation exercises;  breath support exercises; relaxed phonation / oral resonance     Person(s) Educated Patient;Spouse   Methods Explanation;Demonstration;Verbal cues;Handout   Comprehension Verbalized understanding            SLP Long Term Goals - 05/28/16 1633      SLP LONG TERM GOAL #1   Title The patient will demonstrate independent understanding of vocal hygiene concepts and extrinsic laryngeal muscle stretches.     Time 8   Period Weeks   Status New     SLP LONG TERM GOAL #2   Title The patient will be independent for abdominal breathing and breath support exercises.   Time 8   Period Weeks   Status New     SLP LONG TERM GOAL #3   Title The patient will maximize voice quality and loudness using breath support for sustained vowel production, pitch glides, and hierarchal speech drill.   Time 8   Period Weeks   Status New  SLP LONG TERM GOAL #4   Title The patient will maximize voice quality and loudness using breath support for paragraph length recitation with 80% accuracy.   Time 8   Period Weeks   Status New          Plan - 05/30/16 1622    Clinical Impression Statement  Patient able to improve vocal quality with flow phonation to improve oral resonance decrease laryngeal strain.   Speech Therapy Frequency 2x / week   Duration Other (comment)   Treatment/Interventions Patient/family education  Voice therapy   Potential to Achieve Goals Good   Potential Considerations Ability to learn/carryover information;Family/community support;Previous level of function;Severity of  impairments;Cooperation/participation level   SLP Home Exercise Plan neck, shoulder, tongue, and throat stretches; supplemental vocal tract relaxation exercises;  breath support exercises; relaxed phonation / oral resonance     Consulted and Agree with Plan of Care Patient;Family member/caregiver   Family Member Consulted Spouse      Patient will benefit from skilled therapeutic intervention in order to improve the following deficits and impairments:   Dysphonia    Problem List There are no active problems to display for this patient.  [name], [place], Susie 05/30/2016, 4:23 PM  [place] MAIN [facility] SERVICES [address] [place], [place], [address] Phone: [phone]   Fax:  [phone]   Name: Jeremy Dinger Sr. MRN: [id number] Date of Birth: Aug 19, 1950

## 2016-06-04 ENCOUNTER — Encounter: Payer: Self-pay | Admitting: Speech Pathology

## 2016-06-04 ENCOUNTER — Ambulatory Visit: Payer: PPO | Admitting: Speech Pathology

## 2016-06-04 DIAGNOSIS — R490 Dysphonia: Secondary | ICD-10-CM | POA: Diagnosis not present

## 2016-06-04 NOTE — Therapy (Signed)
[place] MAIN [facility] SERVICES [address] [place], [place], [address] Phone: [phone]   Fax:  [phone]  Speech Language Pathology Treatment  Patient Details  Name: Jeremy Angell Sr. MRN: [id number] Date of Birth: 29-Mar-1951 Referring Provider: Carloyn Manner  Encounter Date: 06/04/2016      End of Session - 06/04/16 1626    Visit Number 3   Number of Visits 17   Date for SLP Re-Evaluation 07/26/16   SLP Start Time 1500   SLP Stop Time  1554   SLP Time Calculation (min) 54 min   Activity Tolerance Patient tolerated treatment well      Past Medical History:  Diagnosis Date  . Arthritis pain   . Polio    age 66    Past Surgical History:  Procedure Laterality Date  . COLONOSCOPY  2013   Dr Vira Agar  . HERNIA REPAIR Right 03/28/14   inguinal hernia repair    There were no vitals filed for this visit.      Subjective Assessment - 06/04/16 1625    Subjective "I can't do that"- patient is easily frustrated   Currently in Pain? No/denies               ADULT SLP TREATMENT - 06/04/16 0001      General Information   Behavior/Cognition Alert;Cooperative;Pleasant mood   HPI Muscle tension dysphonia     Treatment Provided   Treatment provided Cognitive-Linquistic     Pain Assessment   Pain Assessment No/denies pain     Cognitive-Linquistic Treatment   Treatment focused on Voice   Skilled Treatment The patient was provided with written and verbal teaching regarding vocal hygiene.  The patient was provided with written and verbal teaching regarding neck, shoulder, tongue, and throat stretches exercises to promote relaxed phonation. The patient was provided with written and verbal teaching for supplement vocal tract relaxation exercises (tongue trills).  The patient was provided with written and verbal teaching regarding breath support exercises.    Patient instructed in relaxed phonation / oral resonance.  Improved oral resonance in vocal loudness and effortful pitch glide.       Assessment / Recommendations / Plan   Plan Continue with current plan of care     Progression Toward Goals   Progression toward goals Progressing toward goals          SLP Education - 06/04/16 1626    Education provided Yes   Education Details approach voice exercises slowly and calmly to maximize effectiveness   Person(s) Educated Patient   Methods Explanation   Comprehension Verbalized understanding            SLP Long Term Goals - 05/28/16 1633      SLP LONG TERM GOAL #1   Title The patient will demonstrate independent understanding of vocal hygiene concepts and extrinsic laryngeal muscle stretches.     Time 8   Period Weeks   Status New     SLP LONG TERM GOAL #2   Title The patient will be independent for abdominal breathing and breath support exercises.   Time 8   Period Weeks   Status New     SLP LONG TERM GOAL #3   Title The patient will maximize voice quality and loudness using breath support for sustained vowel production, pitch glides, and hierarchal speech drill.   Time 8   Period Weeks   Status New     SLP LONG TERM GOAL #4  Title The patient will maximize voice quality and loudness using breath support for paragraph length recitation with 80% accuracy.   Time 8   Period Weeks   Status New          Plan - 06/04/16 1627    Clinical Impression Statement  Patient able to improve vocal quality with vocal loudness to improve oral resonance and decrease laryngeal strain.  Improved vocal quality with effortful pitch glide exercise as well as improved pitch range.   Speech Therapy Frequency 2x / week   Duration Other (comment)   Treatment/Interventions Patient/family education  Voice therapy   Potential to Achieve Goals Good   Potential Considerations Ability to learn/carryover information;Family/community support;Previous level of function;Severity of  impairments;Cooperation/participation level   SLP Home Exercise Plan neck, shoulder, tongue, and throat stretches; supplemental vocal tract relaxation exercises;  breath support exercises; relaxed phonation / oral resonance; effortful pitch glide     Consulted and Agree with Plan of Care Patient      Patient will benefit from skilled therapeutic intervention in order to improve the following deficits and impairments:   Dysphonia    Problem List There are no active problems to display for this patient.  [name], MS/CCC- SLP  [name] 06/04/2016, 4:28 PM  [place] MAIN [facility] SERVICES [address] [place], [place], [address] Phone: [phone]   Fax:  [phone]   Name: Jeremy Fikes Sr. MRN: [id number] Date of Birth: 1950-11-26

## 2016-06-06 ENCOUNTER — Ambulatory Visit: Payer: PPO | Attending: Otolaryngology | Admitting: Speech Pathology

## 2016-06-06 DIAGNOSIS — R490 Dysphonia: Secondary | ICD-10-CM | POA: Insufficient documentation

## 2016-06-07 ENCOUNTER — Encounter: Payer: Self-pay | Admitting: Speech Pathology

## 2016-06-07 NOTE — Therapy (Signed)
[place] MAIN [facility] SERVICES [address] [place], [place], [address] Phone: [phone]   Fax:  [phone]  Speech Language Pathology Treatment  Patient Details  Name: Jeremy Cuozzo Sr. MRN: [id number] Date of Birth: May 24, 1950 Referring Provider: Carloyn Manner  Encounter Date: 06/06/2016      End of Session - 06/07/16 0854    Visit Number 4   Number of Visits 17   Date for SLP Re-Evaluation 07/26/16   SLP Start Time 1500   SLP Stop Time  1554   SLP Time Calculation (min) 54 min   Activity Tolerance Patient tolerated treatment well      Past Medical History:  Diagnosis Date  . Arthritis pain   . Polio    age 66    Past Surgical History:  Procedure Laterality Date  . COLONOSCOPY  2013   Dr Vira Agar  . HERNIA REPAIR Right 03/28/14   inguinal hernia repair    There were no vitals filed for this visit.      Subjective Assessment - 06/07/16 0853    Subjective Patient is more willing to try difficult tasks   Currently in Pain? No/denies               ADULT SLP TREATMENT - 06/07/16 0001      General Information   Behavior/Cognition Alert;Cooperative;Pleasant mood   HPI Muscle tension dysphonia     Treatment Provided   Treatment provided Cognitive-Linquistic     Pain Assessment   Pain Assessment No/denies pain     Cognitive-Linquistic Treatment   Treatment focused on Voice   Skilled Treatment The patient was provided with written and verbal teaching regarding vocal hygiene.  The patient was provided with written and verbal teaching regarding neck, shoulder, tongue, and throat stretches exercises to promote relaxed phonation. The patient was provided with written and verbal teaching for supplement vocal tract relaxation exercises (tongue trills).  The patient was provided with written and verbal teaching regarding breath support exercises.    Patient instructed in relaxed phonation / oral resonance.  Improved oral resonance in vocal loudness and effortful pitch glide.  Patient produced 10 effortful pitch glides and reached 368 Hz.  Patient sustained "ah" at 75 dB for 6 seconds.  Imitate loud phrases at average 70 dB.  Generate word/phrase at average 66 dB.     Assessment / Recommendations / Plan   Plan Continue with current plan of care     Progression Toward Goals   Progression toward goals Progressing toward goals          SLP Education - 06/07/16 0854    Education provided Yes   Education Details approach voice exercises slowly and calmly to maximize effectiveness   Person(s) Educated Patient   Methods Explanation   Comprehension Verbalized understanding            SLP Long Term Goals - 05/28/16 1633      SLP LONG TERM GOAL #1   Title The patient will demonstrate independent understanding of vocal hygiene concepts and extrinsic laryngeal muscle stretches.     Time 8   Period Weeks   Status New     SLP LONG TERM GOAL #2   Title The patient will be independent for abdominal breathing and breath support exercises.   Time 8   Period Weeks   Status New     SLP LONG TERM GOAL #3   Title The patient will maximize voice quality and loudness using  breath support for sustained vowel production, pitch glides, and hierarchal speech drill.   Time 8   Period Weeks   Status New     SLP LONG TERM GOAL #4   Title The patient will maximize voice quality and loudness using breath support for paragraph length recitation with 80% accuracy.   Time 8   Period Weeks   Status New          Plan - 06/07/16 [id number]    Clinical Impression Statement  Patient able to improve vocal quality with vocal loudness to improve oral resonance and decrease laryngeal strain.  Improved vocal quality with effortful pitch glide exercise as well as improved pitch range.   Speech Therapy Frequency 2x / week   Duration Other (comment)   Treatment/Interventions Patient/family education;Other (comment)   Voice therapy   Potential to Achieve Goals Good   Potential Considerations Ability to learn/carryover information;Family/community support;Previous level of function;Severity of impairments;Cooperation/participation level   SLP Home Exercise Plan neck, shoulder, tongue, and throat stretches; supplemental vocal tract relaxation exercises;  breath support exercises; relaxed phonation / oral resonance; effortful pitch glide     Consulted and Agree with Plan of Care Patient      Patient will benefit from skilled therapeutic intervention in order to improve the following deficits and impairments:   Dysphonia    Problem List There are no active problems to display for this patient.  [name], [place], Susie 06/07/2016, 8:56 AM  [place] MAIN [facility] SERVICES [address] [place], [place], [address] Phone: [phone]   Fax:  [phone]   Name: Jeremy Ida Sr. MRN: [id number] Date of Birth: [birth year]

## 2016-06-12 ENCOUNTER — Ambulatory Visit: Payer: PPO | Admitting: Speech Pathology

## 2016-06-12 ENCOUNTER — Encounter: Payer: Self-pay | Admitting: Speech Pathology

## 2016-06-12 DIAGNOSIS — R490 Dysphonia: Secondary | ICD-10-CM

## 2016-06-12 NOTE — Therapy (Signed)
[place] MAIN [facility] SERVICES [address] [place], [place], [address] Phone: [phone]   Fax:  [phone]  Speech Language Pathology Treatment  Patient Details  Name: Jeremy Oxley Sr. MRN: [id number] Date of Birth: [birth year] Referring Provider: Carloyn Manner  Encounter Date: 06/12/2016      End of Session - 06/12/16 1456    Visit Number 5   Number of Visits 17   Date for SLP Re-Evaluation 07/26/16   SLP Start Time 1350   SLP Stop Time  1446   SLP Time Calculation (min) 56 min      Past Medical History:  Diagnosis Date  . Arthritis pain   . Polio    age 66    Past Surgical History:  Procedure Laterality Date  . COLONOSCOPY  2013   Dr Vira Agar  . HERNIA REPAIR Right 03/28/14   inguinal hernia repair    There were no vitals filed for this visit.      Subjective Assessment - 06/12/16 1455    Subjective Patient is more willing to try difficult tasks   Currently in Pain? No/denies               ADULT SLP TREATMENT - 06/12/16 0001      General Information   Behavior/Cognition Alert;Cooperative;Pleasant mood   HPI Muscle tension dysphonia     Treatment Provided   Treatment provided Cognitive-Linquistic     Pain Assessment   Pain Assessment No/denies pain     Cognitive-Linquistic Treatment   Treatment focused on Voice   Skilled Treatment The patient was provided with written and verbal teaching regarding vocal hygiene.  The patient was provided with written and verbal teaching regarding neck, shoulder, tongue, and throat stretches exercises to promote relaxed phonation.  The patient was provided with written and verbal teaching regarding breath support exercises.    Patient instructed in relaxed phonation / oral resonance. Improved oral resonance in vocal loudness and effortful pitch glide.  Patient produced 10 effortful pitch glides and reached 352 Hz.  Patient sustained "ah" at 75+ dB for 8 seconds.   Imitate loud phrases at average 72 dB.  Generate word/phrase at average 66 dB.     Assessment / Recommendations / Plan   Plan Continue with current plan of care     Progression Toward Goals   Progression toward goals Progressing toward goals          SLP Education - 06/12/16 1455    Education provided Yes   Education Details Effortful Pitch Glide   Person(s) Educated Patient   Methods Explanation   Comprehension Verbalized understanding            SLP Long Term Goals - 05/28/16 1633      SLP LONG TERM GOAL #1   Title The patient will demonstrate independent understanding of vocal hygiene concepts and extrinsic laryngeal muscle stretches.     Time 8   Period Weeks   Status New     SLP LONG TERM GOAL #2   Title The patient will be independent for abdominal breathing and breath support exercises.   Time 8   Period Weeks   Status New     SLP LONG TERM GOAL #3   Title The patient will maximize voice quality and loudness using breath support for sustained vowel production, pitch glides, and hierarchal speech drill.   Time 8   Period Weeks   Status New     SLP LONG TERM  GOAL #4   Title The patient will maximize voice quality and loudness using breath support for paragraph length recitation with 80% accuracy.   Time 8   Period Weeks   Status New          Plan - 06/12/16 1456    Clinical Impression Statement  Patient able to improve vocal quality with vocal loudness to improve oral resonance and decrease laryngeal strain.  Improved vocal quality with effortful pitch glide exercise as well as improved pitch range.   Speech Therapy Frequency 2x / week   Duration Other (comment)   Treatment/Interventions Patient/family education;Other (comment)  Voice therapy   Potential to Achieve Goals Good   SLP Home Exercise Plan neck, shoulder, tongue, and throat stretches; supplemental vocal tract relaxation exercises;  breath support exercises; relaxed phonation / oral  resonance; effortful pitch glide     Consulted and Agree with Plan of Care Patient      Patient will benefit from skilled therapeutic intervention in order to improve the following deficits and impairments:   Dysphonia    Problem List There are no active problems to display for this patient.  [name], [place], Susie 06/12/2016, 2:57 PM  [place] MAIN [facility] SERVICES [address] [place], [place], [address] Phone: [phone]   Fax:  [phone]   Name: Jeremy Lizotte Sr. MRN: [id number] Date of Birth: 03-Apr-1951

## 2016-06-14 ENCOUNTER — Ambulatory Visit: Payer: PPO | Admitting: Speech Pathology

## 2016-06-14 ENCOUNTER — Encounter: Payer: Self-pay | Admitting: Speech Pathology

## 2016-06-14 DIAGNOSIS — R490 Dysphonia: Secondary | ICD-10-CM

## 2016-06-14 NOTE — Therapy (Signed)
[place] MAIN [facility] SERVICES [address] [place], [place], [address] Phone: [phone]   Fax:  [phone]  Speech Language Pathology Treatment  Patient Details  Name: Jeremy Quintanar Sr. MRN: [id number] Date of Birth: 17-Mar-1951 Referring Provider: Carloyn Manner  Encounter Date: 06/14/2016      End of Session - 06/14/16 1518    Visit Number 6   Number of Visits 17   Date for SLP Re-Evaluation 07/26/16   SLP Start Time 1405   SLP Stop Time  1505   SLP Time Calculation (min) 60 min   Activity Tolerance Patient tolerated treatment well      Past Medical History:  Diagnosis Date  . Arthritis pain   . Polio    age 66    Past Surgical History:  Procedure Laterality Date  . COLONOSCOPY  2013   Dr Vira Agar  . HERNIA REPAIR Right 03/28/14   inguinal hernia repair    There were no vitals filed for this visit.      Subjective Assessment - 06/14/16 1517    Subjective Patient is more willing to try difficult tasks   Patient is accompained by: Family member   Currently in Pain? No/denies               ADULT SLP TREATMENT - 06/14/16 0001      General Information   Behavior/Cognition Alert;Cooperative;Pleasant mood   HPI Muscle tension dysphonia     Treatment Provided   Treatment provided Cognitive-Linquistic     Pain Assessment   Pain Assessment No/denies pain     Cognitive-Linquistic Treatment   Treatment focused on Voice   Skilled Treatment The patient was provided with written and verbal teaching regarding vocal hygiene.  The patient was provided with written and verbal teaching regarding neck, shoulder, tongue, and throat stretches exercises to promote relaxed phonation.  The patient was provided with written and verbal teaching regarding breath support exercises.    Patient instructed in relaxed phonation / oral resonance. Improved oral resonance in vocal loudness and effortful pitch glide.  Patient  produced 10 effortful pitch glides and reached 356 Hz.  Patient sustained "ah" at 75+ dB for 9 seconds.  Imitate loud phrases at average 73 dB.  Generate word/phrase at average 66 dB.     Assessment / Recommendations / Plan   Plan Continue with current plan of care     Progression Toward Goals   Progression toward goals Progressing toward goals          SLP Education - 06/14/16 1518    Education provided Yes   Education Details Vocal loudness   Person(s) Educated Patient;Spouse   Methods Explanation   Comprehension Verbalized understanding            SLP Long Term Goals - 05/28/16 1633      SLP LONG TERM GOAL #1   Title The patient will demonstrate independent understanding of vocal hygiene concepts and extrinsic laryngeal muscle stretches.     Time 8   Period Weeks   Status New     SLP LONG TERM GOAL #2   Title The patient will be independent for abdominal breathing and breath support exercises.   Time 8   Period Weeks   Status New     SLP LONG TERM GOAL #3   Title The patient will maximize voice quality and loudness using breath support for sustained vowel production, pitch glides, and hierarchal speech drill.   Time 8  Period Weeks   Status New     SLP LONG TERM GOAL #4   Title The patient will maximize voice quality and loudness using breath support for paragraph length recitation with 80% accuracy.   Time 8   Period Weeks   Status New          Plan - 06/14/16 1518    Clinical Impression Statement  Patient able to improve vocal quality with vocal loudness to improve oral resonance and decrease laryngeal strain.  Improved vocal quality with effortful pitch glide exercise as well as improved pitch range.   Speech Therapy Frequency 2x / week   Duration Other (comment)   Treatment/Interventions Patient/family education;Other (comment)  Voice therapy   Potential to Achieve Goals Good   Potential Considerations Ability to learn/carryover  information;Family/community support;Previous level of function;Severity of impairments;Cooperation/participation level   SLP Home Exercise Plan neck, shoulder, tongue, and throat stretches; supplemental vocal tract relaxation exercises;  breath support exercises; relaxed phonation / oral resonance; effortful pitch glide     Consulted and Agree with Plan of Care Patient   Family Member Consulted Spouse      Patient will benefit from skilled therapeutic intervention in order to improve the following deficits and impairments:   Dysphonia    Problem List There are no active problems to display for this patient.  [name], [place], Susie 06/14/2016, 3:19 PM  [place] MAIN [facility] SERVICES [address] [place], [place], [address] Phone: [phone]   Fax:  [phone]   Name: Jeremy Wandler Sr. MRN: [id number] Date of Birth: 09-15-1950

## 2016-06-18 ENCOUNTER — Ambulatory Visit: Payer: PPO | Admitting: Speech Pathology

## 2016-06-18 ENCOUNTER — Encounter: Payer: Self-pay | Admitting: Speech Pathology

## 2016-06-18 DIAGNOSIS — R490 Dysphonia: Secondary | ICD-10-CM | POA: Diagnosis not present

## 2016-06-18 NOTE — Therapy (Signed)
[place] MAIN [facility] SERVICES [address] [place], [place], [address] Phone: [phone]   Fax:  [phone]  Speech Language Pathology Treatment  Patient Details  Name: Jeremy Mcinturff Sr. MRN: [id number] Date of Birth: [birth year] Referring Provider: Carloyn Manner  Encounter Date: 06/18/2016      End of Session - 06/18/16 1154    Visit Number 7   Number of Visits 17   Date for SLP Re-Evaluation 07/26/16   SLP Start Time 14   SLP Stop Time  1150   SLP Time Calculation (min) 50 min   Activity Tolerance Patient tolerated treatment well      Past Medical History:  Diagnosis Date  . Arthritis pain   . Polio    age 13    Past Surgical History:  Procedure Laterality Date  . COLONOSCOPY  2013   Dr Vira Agar  . HERNIA REPAIR Right 03/28/14   inguinal hernia repair    There were no vitals filed for this visit.      Subjective Assessment - 06/18/16 1153    Subjective Patient states that he feels his voice is getting better   Currently in Pain? No/denies               ADULT SLP TREATMENT - 06/18/16 0001      General Information   Behavior/Cognition Alert;Cooperative;Pleasant mood   HPI Muscle tension dysphonia     Treatment Provided   Treatment provided Cognitive-Linquistic     Pain Assessment   Pain Assessment No/denies pain     Cognitive-Linquistic Treatment   Treatment focused on Voice   Skilled Treatment The patient was provided with written and verbal teaching regarding vocal hygiene.  The patient was provided with written and verbal teaching regarding neck, shoulder, tongue, and throat stretches exercises to promote relaxed phonation.  The patient was provided with written and verbal teaching regarding breath support exercises.    Patient instructed in relaxed phonation / oral resonance. Improved oral resonance in vocal loudness and effortful pitch glide.  Patient produced 10 effortful pitch glides and  reached 331 Hz.  Patient sustained "ah" at 75+ dB for 9 seconds.  Imitate loud phrases at average 73 dB.  Generate word/phrase at average 66 dB.     Assessment / Recommendations / Plan   Plan Continue with current plan of care     Progression Toward Goals   Progression toward goals Progressing toward goals          SLP Education - 06/18/16 1153    Education provided Yes   Education Details vocal loudness   Person(s) Educated Patient   Methods Explanation   Comprehension Verbalized understanding            SLP Long Term Goals - 05/28/16 1633      SLP LONG TERM GOAL #1   Title The patient will demonstrate independent understanding of vocal hygiene concepts and extrinsic laryngeal muscle stretches.     Time 8   Period Weeks   Status New     SLP LONG TERM GOAL #2   Title The patient will be independent for abdominal breathing and breath support exercises.   Time 8   Period Weeks   Status New     SLP LONG TERM GOAL #3   Title The patient will maximize voice quality and loudness using breath support for sustained vowel production, pitch glides, and hierarchal speech drill.   Time 8   Period Weeks  Status New     SLP LONG TERM GOAL #4   Title The patient will maximize voice quality and loudness using breath support for paragraph length recitation with 80% accuracy.   Time 8   Period Weeks   Status New          Plan - 06/18/16 1154    Clinical Impression Statement  Patient able to improve vocal quality with vocal loudness to improve oral resonance and decrease laryngeal strain.  Improved vocal quality with effortful pitch glide exercise as well as improved pitch range.   Speech Therapy Frequency 2x / week   Duration Other (comment)   Treatment/Interventions Patient/family education;Other (comment)  Voice therapy   Potential to Achieve Goals Good   Potential Considerations Ability to learn/carryover information;Family/community support;Previous level of  function;Severity of impairments;Cooperation/participation level   SLP Home Exercise Plan neck, shoulder, tongue, and throat stretches; supplemental vocal tract relaxation exercises;  breath support exercises; relaxed phonation / oral resonance; effortful pitch glide     Consulted and Agree with Plan of Care Patient      Patient will benefit from skilled therapeutic intervention in order to improve the following deficits and impairments:   Dysphonia    Problem List There are no active problems to display for this patient.  [name], MS/CCC- SLP  Jeremy Harrell 06/18/2016, 11:55 AM  [place] MAIN [facility] SERVICES [address] [place], [place], [address] Phone: [phone]   Fax:  [phone]   Name: Jeremy Townley Sr. MRN: [id number] Date of Birth: Apr 23, 1950

## 2016-06-19 ENCOUNTER — Ambulatory Visit: Payer: PPO | Admitting: Speech Pathology

## 2016-06-21 ENCOUNTER — Encounter: Payer: Self-pay | Admitting: Speech Pathology

## 2016-06-21 ENCOUNTER — Ambulatory Visit: Payer: PPO | Admitting: Speech Pathology

## 2016-06-21 DIAGNOSIS — R490 Dysphonia: Secondary | ICD-10-CM

## 2016-06-21 NOTE — Therapy (Signed)
[place] MAIN [facility] SERVICES [address] [place], [place], [address] Phone: [phone]   Fax:  [phone]  Speech Language Pathology Treatment  Patient Details  Name: Jeremy Merrick Sr. MRN: [id number] Date of Birth: 03-Mar-1951 Referring Provider: Carloyn Manner  Encounter Date: 06/21/2016      End of Session - 06/21/16 1600    Visit Number 8   Number of Visits 17   Date for SLP Re-Evaluation 07/26/16   SLP Start Time 1500   SLP Stop Time  1555   SLP Time Calculation (min) 55 min   Activity Tolerance Patient tolerated treatment well      Past Medical History:  Diagnosis Date  . Arthritis pain   . Polio    age 66    Past Surgical History:  Procedure Laterality Date  . COLONOSCOPY  2013   Dr Vira Agar  . HERNIA REPAIR Right 03/28/14   inguinal hernia repair    There were no vitals filed for this visit.      Subjective Assessment - 06/21/16 1600    Subjective Patient states that he feels his voice is getting better   Currently in Pain? No/denies               ADULT SLP TREATMENT - 06/21/16 0001      General Information   Behavior/Cognition Alert;Cooperative;Pleasant mood   HPI Muscle tension dysphonia     Treatment Provided   Treatment provided Cognitive-Linquistic     Pain Assessment   Pain Assessment No/denies pain     Cognitive-Linquistic Treatment   Treatment focused on Voice   Skilled Treatment The patient was provided with written and verbal teaching regarding vocal hygiene.  The patient was provided with written and verbal teaching regarding neck, shoulder, tongue, and throat stretches exercises to promote relaxed phonation.  The patient was provided with written and verbal teaching regarding breath support exercises.    Patient instructed in relaxed phonation / oral resonance. Improved oral resonance in vocal loudness and effortful pitch glide.  Patient produced 10 effortful pitch glides and  reached 371 Hz.  Patient sustained "ah" at 80+ dB for 9 seconds.  Read loud phrases at average 73 dB.  Generate word/phrase at average 68 dB.     Assessment / Recommendations / Plan   Plan Continue with current plan of care     Progression Toward Goals   Progression toward goals Progressing toward goals          SLP Education - 06/21/16 1600    Education provided Yes   Education Details vocal loudness   Person(s) Educated Patient   Methods Explanation   Comprehension Verbalized understanding            SLP Long Term Goals - 05/28/16 1633      SLP LONG TERM GOAL #1   Title The patient will demonstrate independent understanding of vocal hygiene concepts and extrinsic laryngeal muscle stretches.     Time 8   Period Weeks   Status New     SLP LONG TERM GOAL #2   Title The patient will be independent for abdominal breathing and breath support exercises.   Time 8   Period Weeks   Status New     SLP LONG TERM GOAL #3   Title The patient will maximize voice quality and loudness using breath support for sustained vowel production, pitch glides, and hierarchal speech drill.   Time 8   Period Weeks  Status New     SLP LONG TERM GOAL #4   Title The patient will maximize voice quality and loudness using breath support for paragraph length recitation with 80% accuracy.   Time 8   Period Weeks   Status New          Plan - 06/21/16 1601    Clinical Impression Statement  Patient able to improve vocal quality with vocal loudness to improve oral resonance and decrease laryngeal strain.  Improved vocal quality with effortful pitch glide exercise as well as improved pitch range.   Speech Therapy Frequency 2x / week   Duration Other (comment)   Treatment/Interventions Patient/family education;Other (comment)  Voice therapy   Potential to Achieve Goals Good   Potential Considerations Ability to learn/carryover information;Family/community support;Previous level of  function;Severity of impairments;Cooperation/participation level   SLP Home Exercise Plan neck, shoulder, tongue, and throat stretches; supplemental vocal tract relaxation exercises;  breath support exercises; relaxed phonation / oral resonance; effortful pitch glide     Consulted and Agree with Plan of Care Patient      Patient will benefit from skilled therapeutic intervention in order to improve the following deficits and impairments:   Dysphonia    Problem List There are no active problems to display for this patient.  Jeremy Harrell, [place], Susie 06/21/2016, 4:02 PM  [place] MAIN [facility] SERVICES [address] [place], [place], [address] Phone: [phone]   Fax:  [phone]   Name: Jeremy Merriott Sr. MRN: [id number] Date of Birth: 12/12/1950

## 2016-06-24 ENCOUNTER — Encounter: Payer: Self-pay | Admitting: Speech Pathology

## 2016-06-24 ENCOUNTER — Ambulatory Visit: Payer: PPO | Admitting: Speech Pathology

## 2016-06-24 DIAGNOSIS — R490 Dysphonia: Secondary | ICD-10-CM | POA: Diagnosis not present

## 2016-06-24 NOTE — Therapy (Signed)
[place] MAIN [facility] SERVICES [address] [place], [place], [address] Phone: [phone]   Fax:  [phone]  Speech Language Pathology Treatment  Patient Details  Name: Jeremy Saladin Sr. MRN: [id number] Date of Birth: 1950/04/13 Referring Provider: Carloyn Manner  Encounter Date: 06/24/2016      End of Session - 06/24/16 1159    Visit Number 9   Number of Visits 17   Date for SLP Re-Evaluation 07/26/16   SLP Start Time 22   SLP Stop Time  1145   SLP Time Calculation (min) 50 min   Activity Tolerance Patient tolerated treatment well      Past Medical History:  Diagnosis Date  . Arthritis pain   . Polio    age 66    Past Surgical History:  Procedure Laterality Date  . COLONOSCOPY  2013   Dr Vira Agar  . HERNIA REPAIR Right 03/28/14   inguinal hernia repair    There were no vitals filed for this visit.      Subjective Assessment - 06/24/16 1158    Subjective Patient states that he feels his voice is getting better   Currently in Pain? No/denies               ADULT SLP TREATMENT - 06/24/16 0001      General Information   Behavior/Cognition Alert;Cooperative;Pleasant mood   HPI Muscle tension dysphonia     Treatment Provided   Treatment provided Cognitive-Linquistic     Pain Assessment   Pain Assessment No/denies pain     Cognitive-Linquistic Treatment   Treatment focused on Voice   Skilled Treatment The patient was provided with written and verbal teaching regarding vocal hygiene.  The patient was provided with written and verbal teaching regarding neck, shoulder, tongue, and throat stretches exercises to promote relaxed phonation.  The patient was provided with written and verbal teaching regarding breath support exercises.    Patient instructed in relaxed phonation / oral resonance. Improved oral resonance in vocal loudness and effortful pitch glide.  Patient produced 10 effortful pitch glides and  reached 371 Hz.  Patient sustained "ah" at 80+ dB for 9 seconds.  Read loud phrases at average 73 dB.  Generate word/phrase at average 70 dB.     Assessment / Recommendations / Plan   Plan Continue with current plan of care     Progression Toward Goals   Progression toward goals Progressing toward goals          SLP Education - 06/24/16 1158    Education provided Yes   Education Details vocal loudness   Person(s) Educated Patient   Methods Explanation   Comprehension Verbalized understanding            SLP Long Term Goals - 05/28/16 1633      SLP LONG TERM GOAL #1   Title The patient will demonstrate independent understanding of vocal hygiene concepts and extrinsic laryngeal muscle stretches.     Time 8   Period Weeks   Status New     SLP LONG TERM GOAL #2   Title The patient will be independent for abdominal breathing and breath support exercises.   Time 8   Period Weeks   Status New     SLP LONG TERM GOAL #3   Title The patient will maximize voice quality and loudness using breath support for sustained vowel production, pitch glides, and hierarchal speech drill.   Time 8   Period Weeks  Status New     SLP LONG TERM GOAL #4   Title The patient will maximize voice quality and loudness using breath support for paragraph length recitation with 80% accuracy.   Time 8   Period Weeks   Status New          Plan - 06/24/16 1200    Clinical Impression Statement  Patient able to improve vocal quality with vocal loudness to improve oral resonance and decrease laryngeal strain.  Improved vocal quality with effortful pitch glide exercise as well as improved pitch range.   Speech Therapy Frequency 2x / week   Duration Other (comment)   Treatment/Interventions Patient/family education;Other (comment)  Voice therapy   Potential to Achieve Goals Good   Potential Considerations Ability to learn/carryover information;Family/community support;Previous level of  function;Severity of impairments;Cooperation/participation level   SLP Home Exercise Plan neck, shoulder, tongue, and throat stretches; supplemental vocal tract relaxation exercises;  breath support exercises; relaxed phonation / oral resonance; effortful pitch glide     Consulted and Agree with Plan of Care Patient      Patient will benefit from skilled therapeutic intervention in order to improve the following deficits and impairments:   Dysphonia    Problem List There are no active problems to display for this patient.  [name], [place], Susie 06/24/2016, 12:00 PM  [place] MAIN [facility] SERVICES [address] [place], [place], [address] Phone: [phone]   Fax:  [phone]   Name: Sumner Kirchman Sr. MRN: [id number] Date of Birth: [birth year]

## 2016-06-27 ENCOUNTER — Ambulatory Visit: Payer: PPO | Admitting: Speech Pathology

## 2016-06-27 ENCOUNTER — Encounter: Payer: Self-pay | Admitting: Speech Pathology

## 2016-06-27 DIAGNOSIS — R490 Dysphonia: Secondary | ICD-10-CM

## 2016-06-27 NOTE — Therapy (Signed)
[place] MAIN [facility] SERVICES [address] [place], [place], [address] Phone: [phone]   Fax:  [phone]  Speech Language Pathology Treatment/Progress Note  Patient Details  Name: Jeremy Senske Sr. MRN: [id number] Date of Birth: [birth year] Referring Provider: Carloyn Manner  Encounter Date: 06/27/2016      End of Session - 06/27/16 0956    Visit Number 10   Number of Visits 17   Date for SLP Re-Evaluation 07/26/16   SLP Start Time 0900   SLP Stop Time  0950   SLP Time Calculation (min) 50 min   Activity Tolerance Patient tolerated treatment well      Past Medical History:  Diagnosis Date  . Arthritis pain   . Polio    age 66    Past Surgical History:  Procedure Laterality Date  . COLONOSCOPY  2013   Dr Vira Agar  . HERNIA REPAIR Right 03/28/14   inguinal hernia repair    There were no vitals filed for this visit.      Subjective Assessment - 06/27/16 0955    Subjective Patient states that he feels his voice is getting better and that his wife agrees.   Currently in Pain? No/denies               ADULT SLP TREATMENT - 06/27/16 0001      General Information   Behavior/Cognition Alert;Cooperative;Pleasant mood   HPI Muscle tension dysphonia     Treatment Provided   Treatment provided Cognitive-Linquistic     Pain Assessment   Pain Assessment No/denies pain     Cognitive-Linquistic Treatment   Treatment focused on Voice   Skilled Treatment The patient was provided with written and verbal teaching regarding vocal hygiene.  The patient was provided with written and verbal teaching regarding neck, shoulder, tongue, and throat stretches exercises to promote relaxed phonation.  The patient was provided with written and verbal teaching regarding breath support exercises.    Patient instructed in relaxed phonation / oral resonance. Improved oral resonance in vocal loudness and effortful pitch glide.   Patient produced 10 effortful pitch glides and reached 340 Hz.  Patient sustained "ah" at 80+ dB for 9 seconds.  Read loud phrases at average 74 dB.  Generate word/phrase at average 70 dB.     Assessment / Recommendations / Plan   Plan Continue with current plan of care     Progression Toward Goals   Progression toward goals Progressing toward goals          SLP Education - 06/27/16 0955    Education provided Yes   Education Details vocal loudness   Person(s) Educated Patient   Methods Explanation   Comprehension Verbalized understanding            SLP Long Term Goals - 06/27/16 0956      SLP LONG TERM GOAL #1   Title The patient will demonstrate independent understanding of vocal hygiene concepts and extrinsic laryngeal muscle stretches.     Time 8   Period Weeks   Status Partially Met     SLP LONG TERM GOAL #2   Title The patient will be independent for abdominal breathing and breath support exercises.   Time 8   Period Weeks   Status Partially Met     SLP LONG TERM GOAL #3   Title The patient will maximize voice quality and loudness using breath support for sustained vowel production, pitch glides, and hierarchal speech drill.  Time 8   Period Weeks   Status Partially Met     SLP LONG TERM GOAL #4   Title The patient will maximize voice quality and loudness using breath support for paragraph length recitation with 80% accuracy.   Time 8   Period Weeks   Status Partially Met          Plan - Jul 04, 2016 0956    Clinical Impression Statement  Patient able to improve vocal quality with vocal loudness to improve oral resonance and decrease laryngeal strain.  Improved vocal quality with effortful pitch glide exercise as well as improved pitch range.   Speech Therapy Frequency 2x / week   Duration Other (comment)   Treatment/Interventions Patient/family education;Other (comment)  Voice therapy   Potential to Achieve Goals Good   Potential Considerations Ability  to learn/carryover information;Family/community support;Previous level of function;Severity of impairments;Cooperation/participation level   SLP Home Exercise Plan neck, shoulder, tongue, and throat stretches; supplemental vocal tract relaxation exercises;  breath support exercises; relaxed phonation / oral resonance; effortful pitch glide     Consulted and Agree with Plan of Care Patient      Patient will benefit from skilled therapeutic intervention in order to improve the following deficits and impairments:   Dysphonia      G-Codes - July 04, 2016 1002    Functional Assessment Tool Used Clinical judgment   Functional Limitations Voice   Voice Current Status (G9171) At least 40 percent but less than 60 percent impaired, limited or restricted   Voice Goal Status (G9172) At least 20 percent but less than 40 percent impaired, limited or restricted      Problem List There are no active problems to display for this patient.  [name], MS/CCC- SLP  [name] 07-04-16, 10:03 AM  [place] MAIN [facility] SERVICES [address] [place], [place], [address] Phone: [phone]   Fax:  [phone]   Name: Jeremy Fluharty Sr. MRN: [id number] Date of Birth: 09/13/1950

## 2016-07-01 ENCOUNTER — Ambulatory Visit: Payer: PPO | Admitting: Speech Pathology

## 2016-07-01 ENCOUNTER — Encounter: Payer: Self-pay | Admitting: Speech Pathology

## 2016-07-01 DIAGNOSIS — R490 Dysphonia: Secondary | ICD-10-CM | POA: Diagnosis not present

## 2016-07-01 NOTE — Therapy (Signed)
[place] MAIN [facility] SERVICES [address] [place], [place], [address] Phone: [phone]   Fax:  [phone]  Speech Language Pathology Treatment  Patient Details  Name: Jeremy Oettinger Sr. MRN: [id number] Date of Birth: [birth year] Referring Provider: Carloyn Manner  Encounter Date: 07/01/2016      End of Session - 07/01/16 1205    Visit Number 11   Number of Visits 17   Date for SLP Re-Evaluation 07/26/16   SLP Start Time 6   SLP Stop Time  1200   SLP Time Calculation (min) 60 min   Activity Tolerance Patient tolerated treatment well      Past Medical History:  Diagnosis Date  . Arthritis pain   . Polio    age 47    Past Surgical History:  Procedure Laterality Date  . COLONOSCOPY  2013   Dr Vira Agar  . HERNIA REPAIR Right 03/28/14   inguinal hernia repair    There were no vitals filed for this visit.      Subjective Assessment - 07/01/16 1204    Subjective Patient states that he feels his voice is getting better and that his wife agrees.   Currently in Pain? No/denies               ADULT SLP TREATMENT - 07/01/16 0001      General Information   Behavior/Cognition Alert;Cooperative;Pleasant mood   HPI Muscle tension dysphonia     Treatment Provided   Treatment provided Cognitive-Linquistic     Pain Assessment   Pain Assessment No/denies pain     Cognitive-Linquistic Treatment   Treatment focused on Voice   Skilled Treatment The patient was provided with written and verbal teaching regarding vocal hygiene.  The patient was provided with written and verbal teaching regarding neck, shoulder, tongue, and throat stretches exercises to promote relaxed phonation.  The patient was provided with written and verbal teaching regarding breath support exercises.    Patient instructed in relaxed phonation / oral resonance. Improved oral resonance in vocal loudness and effortful pitch glide.  Patient produced 10  effortful pitch glides and reached 340 Hz.  Patient sustained "ah" at 80+ dB for 9 seconds.  Read loud phrases at average 74 dB.  Generate word/phrase at average 70 dB.     Assessment / Recommendations / Plan   Plan Continue with current plan of care     Progression Toward Goals   Progression toward goals Progressing toward goals          SLP Education - 07/01/16 1205    Education provided Yes   Education Details vocal loudness   Person(s) Educated Patient   Methods Explanation   Comprehension Verbalized understanding            SLP Long Term Goals - 06/27/16 0956      SLP LONG TERM GOAL #1   Title The patient will demonstrate independent understanding of vocal hygiene concepts and extrinsic laryngeal muscle stretches.     Time 8   Period Weeks   Status Partially Met     SLP LONG TERM GOAL #2   Title The patient will be independent for abdominal breathing and breath support exercises.   Time 8   Period Weeks   Status Partially Met     SLP LONG TERM GOAL #3   Title The patient will maximize voice quality and loudness using breath support for sustained vowel production, pitch glides, and hierarchal speech drill.   Time  8   Period Weeks   Status Partially Met     SLP LONG TERM GOAL #4   Title The patient will maximize voice quality and loudness using breath support for paragraph length recitation with 80% accuracy.   Time 8   Period Weeks   Status Partially Met          Plan - 07/01/16 1206    Clinical Impression Statement  Patient able to improve vocal quality with vocal loudness to improve oral resonance and decrease laryngeal strain.  Improved vocal quality with effortful pitch glide exercise as well as improved pitch range.   Speech Therapy Frequency 2x / week   Duration Other (comment)   Treatment/Interventions Patient/family education;Other (comment)  Voice therapy   Potential to Achieve Goals Good   Potential Considerations Ability to learn/carryover  information;Family/community support;Previous level of function;Severity of impairments;Cooperation/participation level   SLP Home Exercise Plan neck, shoulder, tongue, and throat stretches; supplemental vocal tract relaxation exercises;  breath support exercises; relaxed phonation / oral resonance; effortful pitch glide     Consulted and Agree with Plan of Care Patient      Patient will benefit from skilled therapeutic intervention in order to improve the following deficits and impairments:   Dysphonia    Problem List There are no active problems to display for this patient.  [name], MS/CCC- SLP  [name] 07/01/2016, 12:08 PM  [place] MAIN [facility] SERVICES [address] [place], [place], [address] Phone: [phone]   Fax:  [phone]   Name: Skiler Olden Sr. MRN: [id number] Date of Birth: 1950/08/20

## 2016-07-05 ENCOUNTER — Encounter: Payer: Self-pay | Admitting: Speech Pathology

## 2016-07-05 ENCOUNTER — Ambulatory Visit: Payer: PPO | Admitting: Speech Pathology

## 2016-07-05 DIAGNOSIS — R490 Dysphonia: Secondary | ICD-10-CM | POA: Diagnosis not present

## 2016-07-05 NOTE — Therapy (Signed)
[place] MAIN [facility] SERVICES [address] [place], [place], [address] Phone: [phone]   Fax:  [phone]  Speech Language Pathology Treatment  Patient Details  Name: Jeremy Sallade Sr. MRN: [id number] Date of Birth: February 14, 1951 Referring Provider: Carloyn Manner  Encounter Date: 07/05/2016      End of Session - 07/05/16 1223    Visit Number 12   Number of Visits 17   Date for SLP Re-Evaluation 07/26/16   SLP Start Time 58   SLP Stop Time  4132   SLP Time Calculation (min) 56 min   Activity Tolerance Patient tolerated treatment well      Past Medical History:  Diagnosis Date  . Arthritis pain   . Polio    age 66    Past Surgical History:  Procedure Laterality Date  . COLONOSCOPY  2013   Dr Vira Agar  . HERNIA REPAIR Right 03/28/14   inguinal hernia repair    There were no vitals filed for this visit.      Subjective Assessment - 07/05/16 1222    Subjective Patient states that he feels his voice is getting better and that his wife agrees.   Currently in Pain? No/denies               ADULT SLP TREATMENT - 07/05/16 0001      General Information   Behavior/Cognition Alert;Cooperative;Pleasant mood   HPI Muscle tension dysphonia     Treatment Provided   Treatment provided Cognitive-Linquistic     Pain Assessment   Pain Assessment No/denies pain     Cognitive-Linquistic Treatment   Treatment focused on Voice   Skilled Treatment The patient was provided with written and verbal teaching regarding vocal hygiene.  The patient was provided with written and verbal teaching regarding neck, shoulder, tongue, and throat stretches exercises to promote relaxed phonation.  The patient was provided with written and verbal teaching regarding breath support exercises.    Patient instructed in relaxed phonation / oral resonance. Improved oral resonance in vocal loudness and effortful pitch glide.  Patient produced 10  effortful pitch glides and reached 340 Hz.  Patient sustained "ah" at 80+ dB for 9 seconds.  Read loud phrases at average 74 dB.  Generate word/phrase at average 70 dB.  Conversational speech, average 70 dB.     Assessment / Recommendations / Plan   Plan Continue with current plan of care     Progression Toward Goals   Progression toward goals Progressing toward goals          SLP Education - 07/05/16 1222    Education provided Yes   Education Details vocal loudness, word finding strategies   Person(s) Educated Patient   Methods Explanation   Comprehension Verbalized understanding            SLP Long Term Goals - 06/27/16 0956      SLP LONG TERM GOAL #1   Title The patient will demonstrate independent understanding of vocal hygiene concepts and extrinsic laryngeal muscle stretches.     Time 8   Period Weeks   Status Partially Met     SLP LONG TERM GOAL #2   Title The patient will be independent for abdominal breathing and breath support exercises.   Time 8   Period Weeks   Status Partially Met     SLP LONG TERM GOAL #3   Title The patient will maximize voice quality and loudness using breath support for sustained vowel production,  pitch glides, and hierarchal speech drill.   Time 8   Period Weeks   Status Partially Met     SLP LONG TERM GOAL #4   Title The patient will maximize voice quality and loudness using breath support for paragraph length recitation with 80% accuracy.   Time 8   Period Weeks   Status Partially Met          Plan - 07/05/16 1223    Clinical Impression Statement  Patient able to improve vocal quality with vocal loudness to improve oral resonance and decrease laryngeal strain.  Improved vocal quality with effortful pitch glide exercise as well as improved pitch range.  The patient demonstrates generalization to conversational speech.   Speech Therapy Frequency 2x / week   Duration Other (comment)   Treatment/Interventions Patient/family  education;Other (comment)  Voice therapy   Potential to Achieve Goals Good   Potential Considerations Ability to learn/carryover information;Family/community support;Previous level of function;Severity of impairments;Cooperation/participation level   SLP Home Exercise Plan neck, shoulder, tongue, and throat stretches; supplemental vocal tract relaxation exercises;  breath support exercises; relaxed phonation / oral resonance; effortful pitch glide     Consulted and Agree with Plan of Care Patient      Patient will benefit from skilled therapeutic intervention in order to improve the following deficits and impairments:   Dysphonia    Problem List There are no active problems to display for this patient.  Jeremy Harrell, [place], Susie 07/05/2016, 12:24 PM  [place] MAIN [facility] SERVICES [address] [place], [place], [address] Phone: [phone]   Fax:  [phone]   Name: Jeremy Gladden Sr. MRN: [id number] Date of Birth: 12-May-1950

## 2016-07-26 ENCOUNTER — Encounter: Payer: Self-pay | Admitting: Speech Pathology

## 2016-07-26 ENCOUNTER — Ambulatory Visit: Payer: PPO | Attending: Otolaryngology | Admitting: Speech Pathology

## 2016-07-26 DIAGNOSIS — E785 Hyperlipidemia, unspecified: Secondary | ICD-10-CM | POA: Diagnosis not present

## 2016-07-26 DIAGNOSIS — R490 Dysphonia: Secondary | ICD-10-CM

## 2016-07-26 DIAGNOSIS — Z125 Encounter for screening for malignant neoplasm of prostate: Secondary | ICD-10-CM | POA: Diagnosis not present

## 2016-07-26 DIAGNOSIS — R739 Hyperglycemia, unspecified: Secondary | ICD-10-CM | POA: Diagnosis not present

## 2016-07-26 DIAGNOSIS — I739 Peripheral vascular disease, unspecified: Secondary | ICD-10-CM | POA: Diagnosis not present

## 2016-07-26 NOTE — Therapy (Signed)
[employer] MAIN [facility] SERVICES [address] [place], [place], [address] Phone: [phone]   Fax:  [phone]  Speech Language Pathology Treatment/Discharge Summary  Patient Details  Name: Jeremy Mahler Sr. MRN: [id number] Date of Birth: Sep 19, 1950 Referring Provider: Carloyn Manner  Encounter Date: 07/26/2016      End of Session - 07/26/16 1029    Visit Number 13   Number of Visits 17   Date for SLP Re-Evaluation 07/26/16   SLP Start Time 0940   SLP Stop Time  1030   SLP Time Calculation (min) 50 min   Activity Tolerance Patient tolerated treatment well      Past Medical History:  Diagnosis Date  . Arthritis pain   . Polio    age 66    Past Surgical History:  Procedure Laterality Date  . COLONOSCOPY  2013   Dr Vira Agar  . HERNIA REPAIR Right 03/28/14   inguinal hernia repair    There were no vitals filed for this visit.      Subjective Assessment - 07/26/16 1029    Subjective Patient states that he feels his voice is better and that his wife agrees.   Currently in Pain? No/denies               ADULT SLP TREATMENT - 07/26/16 0001      General Information   Behavior/Cognition Alert;Cooperative;Pleasant mood   HPI Muscle tension dysphonia     Treatment Provided   Treatment provided Cognitive-Linquistic     Pain Assessment   Pain Assessment No/denies pain     Cognitive-Linquistic Treatment   Treatment focused on Voice   Skilled Treatment The patient was provided with written and verbal teaching regarding vocal hygiene.  The patient was provided with written and verbal teaching regarding neck, shoulder, tongue, and throat stretches exercises to promote relaxed phonation.  The patient was provided with written and verbal teaching regarding breath support exercises.    Patient instructed in relaxed phonation / oral resonance. Improved oral resonance in vocal loudness and effortful pitch glide.  Patient  produced 10 effortful pitch glides and reached 340 Hz.  Patient sustained "ah" at 80+ dB for 9 seconds.  Generate word/phrase at average 70 dB.  Conversational speech, average 70 dB.     Assessment / Recommendations / Plan   Plan Discharge SLP treatment due to (comment);All goals met     Progression Toward Goals   Progression toward goals Goals met, education completed, patient discharged from SLP          SLP Education - 07/26/16 1029    Education provided Yes   Education Details vocal loudness, relaxed phonation, word finding strategies   Person(s) Educated Patient   Methods Explanation   Comprehension Verbalized understanding            SLP Long Term Goals - 07/26/16 1031      SLP LONG TERM GOAL #1   Title The patient will demonstrate independent understanding of vocal hygiene concepts and extrinsic laryngeal muscle stretches.     Time 8   Period Weeks   Status Achieved     SLP LONG TERM GOAL #2   Title The patient will be independent for abdominal breathing and breath support exercises.   Time 8   Period Weeks   Status Achieved     SLP LONG TERM GOAL #3   Title The patient will maximize voice quality and loudness using breath support for sustained vowel production, pitch  glides, and hierarchal speech drill.   Time 8   Period Weeks   Status Achieved     SLP LONG TERM GOAL #4   Title The patient will maximize voice quality and loudness using breath support for paragraph length recitation with 80% accuracy.   Time 8   Period Weeks   Status Achieved          Plan - 08/11/2016 1030    Clinical Impression Statement  Patient able to improve vocal quality with vocal loudness to improve oral resonance and decrease laryngeal strain.  Improved vocal quality with effortful pitch glide exercise as well as improved pitch range.  The patient demonstrates generalization to conversational speech.  The patient has met his voice goals and is satisfied with his current  communication abilities.     Speech Therapy Frequency 2x / week   Duration Other (comment)   Treatment/Interventions Patient/family education;Other (comment)  Voice therapy   Potential to Achieve Goals Good   Potential Considerations Ability to learn/carryover information;Family/community support;Previous level of function;Severity of impairments;Cooperation/participation level   SLP Home Exercise Plan neck, shoulder, tongue, and throat stretches; supplemental vocal tract relaxation exercises;  breath support exercises; relaxed phonation / oral resonance; effortful pitch glide     Consulted and Agree with Plan of Care Patient      Patient will benefit from skilled therapeutic intervention in order to improve the following deficits and impairments:   Dysphonia      G-Codes - Aug 11, 2016 1031    Functional Assessment Tool Used Clinical judgment   Functional Limitations Voice   Voice Current Status (G9171) At least 20 percent but less than 40 percent impaired, limited or restricted   Voice Goal Status (G9172) At least 20 percent but less than 40 percent impaired, limited or restricted   Voice Discharge Status (K8638) At least 20 percent but less than 40 percent impaired, limited or restricted      Problem List There are no active problems to display for this patient.  [name], MS/CCC- SLP  [name] 08-11-2016, 10:33 AM  [place] MAIN [facility] SERVICES [address] [place], [place], [address] Phone: [phone]   Fax:  [phone]   Name: Jeremy Colligan Sr. MRN: [id number] Date of Birth: Jun 06, 1950

## 2016-08-02 ENCOUNTER — Ambulatory Visit: Payer: PPO | Admitting: Speech Pathology

## 2016-08-02 DIAGNOSIS — I739 Peripheral vascular disease, unspecified: Secondary | ICD-10-CM | POA: Diagnosis not present

## 2016-08-02 DIAGNOSIS — Z8673 Personal history of transient ischemic attack (TIA), and cerebral infarction without residual deficits: Secondary | ICD-10-CM | POA: Diagnosis not present

## 2016-08-02 DIAGNOSIS — R739 Hyperglycemia, unspecified: Secondary | ICD-10-CM | POA: Diagnosis not present

## 2016-08-02 DIAGNOSIS — J449 Chronic obstructive pulmonary disease, unspecified: Secondary | ICD-10-CM | POA: Diagnosis not present

## 2016-08-02 DIAGNOSIS — F325 Major depressive disorder, single episode, in full remission: Secondary | ICD-10-CM | POA: Diagnosis not present

## 2016-08-02 DIAGNOSIS — E785 Hyperlipidemia, unspecified: Secondary | ICD-10-CM | POA: Diagnosis not present

## 2016-08-09 ENCOUNTER — Ambulatory Visit: Payer: PPO | Admitting: Speech Pathology

## 2016-09-12 DIAGNOSIS — Z4509 Encounter for adjustment and management of other cardiac device: Secondary | ICD-10-CM | POA: Diagnosis not present

## 2016-09-12 DIAGNOSIS — Z8673 Personal history of transient ischemic attack (TIA), and cerebral infarction without residual deficits: Secondary | ICD-10-CM | POA: Diagnosis not present

## 2016-11-14 DIAGNOSIS — Z95818 Presence of other cardiac implants and grafts: Secondary | ICD-10-CM | POA: Diagnosis not present

## 2016-11-14 DIAGNOSIS — Z8673 Personal history of transient ischemic attack (TIA), and cerebral infarction without residual deficits: Secondary | ICD-10-CM | POA: Diagnosis not present

## 2016-11-14 DIAGNOSIS — Z4509 Encounter for adjustment and management of other cardiac device: Secondary | ICD-10-CM | POA: Diagnosis not present

## 2017-01-28 DIAGNOSIS — R739 Hyperglycemia, unspecified: Secondary | ICD-10-CM | POA: Diagnosis not present

## 2017-01-28 DIAGNOSIS — I739 Peripheral vascular disease, unspecified: Secondary | ICD-10-CM | POA: Diagnosis not present

## 2017-01-28 DIAGNOSIS — Z8673 Personal history of transient ischemic attack (TIA), and cerebral infarction without residual deficits: Secondary | ICD-10-CM | POA: Diagnosis not present

## 2017-01-28 DIAGNOSIS — J449 Chronic obstructive pulmonary disease, unspecified: Secondary | ICD-10-CM | POA: Diagnosis not present

## 2017-01-28 DIAGNOSIS — E785 Hyperlipidemia, unspecified: Secondary | ICD-10-CM | POA: Diagnosis not present

## 2017-02-04 DIAGNOSIS — J449 Chronic obstructive pulmonary disease, unspecified: Secondary | ICD-10-CM | POA: Diagnosis not present

## 2017-02-04 DIAGNOSIS — E785 Hyperlipidemia, unspecified: Secondary | ICD-10-CM | POA: Diagnosis not present

## 2017-02-04 DIAGNOSIS — M5442 Lumbago with sciatica, left side: Secondary | ICD-10-CM | POA: Diagnosis not present

## 2017-02-04 DIAGNOSIS — Z23 Encounter for immunization: Secondary | ICD-10-CM | POA: Diagnosis not present

## 2017-02-04 DIAGNOSIS — M545 Low back pain: Secondary | ICD-10-CM | POA: Diagnosis not present

## 2017-02-04 DIAGNOSIS — R739 Hyperglycemia, unspecified: Secondary | ICD-10-CM | POA: Diagnosis not present

## 2017-02-04 DIAGNOSIS — F325 Major depressive disorder, single episode, in full remission: Secondary | ICD-10-CM | POA: Diagnosis not present

## 2017-02-04 DIAGNOSIS — Z87891 Personal history of nicotine dependence: Secondary | ICD-10-CM | POA: Diagnosis not present

## 2017-02-04 DIAGNOSIS — G8929 Other chronic pain: Secondary | ICD-10-CM | POA: Diagnosis not present

## 2017-02-04 DIAGNOSIS — Z0000 Encounter for general adult medical examination without abnormal findings: Secondary | ICD-10-CM | POA: Diagnosis not present

## 2017-02-04 DIAGNOSIS — I69359 Hemiplegia and hemiparesis following cerebral infarction affecting unspecified side: Secondary | ICD-10-CM | POA: Diagnosis not present

## 2017-02-04 DIAGNOSIS — I739 Peripheral vascular disease, unspecified: Secondary | ICD-10-CM | POA: Diagnosis not present

## 2017-02-07 ENCOUNTER — Telehealth: Payer: Self-pay | Admitting: *Deleted

## 2017-02-07 NOTE — Telephone Encounter (Signed)
Received referral for low dose lung cancer screening CT scan. Message left at phone number listed in EMR for patient to call me back to facilitate scheduling scan.  

## 2017-02-11 ENCOUNTER — Telehealth: Payer: Self-pay | Admitting: *Deleted

## 2017-02-11 DIAGNOSIS — Z122 Encounter for screening for malignant neoplasm of respiratory organs: Secondary | ICD-10-CM

## 2017-02-11 NOTE — Telephone Encounter (Signed)
Received referral for initial lung cancer screening scan. Contacted patient and obtained smoking history,(former, quit 2017, 100 pack year) as well as answering questions related to screening process. Patient denies signs of lung cancer such as weight loss or hemoptysis. Patient denies comorbidity that would prevent curative treatment if lung cancer were found. Patient is scheduled for shared decision making visit and CT scan on 02/25/17.

## 2017-02-18 DIAGNOSIS — G8929 Other chronic pain: Secondary | ICD-10-CM | POA: Diagnosis not present

## 2017-02-18 DIAGNOSIS — M5442 Lumbago with sciatica, left side: Secondary | ICD-10-CM | POA: Diagnosis not present

## 2017-02-19 DIAGNOSIS — Z800 Family history of malignant neoplasm of digestive organs: Secondary | ICD-10-CM | POA: Diagnosis not present

## 2017-02-19 DIAGNOSIS — R202 Paresthesia of skin: Secondary | ICD-10-CM | POA: Diagnosis not present

## 2017-02-19 DIAGNOSIS — K219 Gastro-esophageal reflux disease without esophagitis: Secondary | ICD-10-CM | POA: Diagnosis not present

## 2017-02-19 DIAGNOSIS — R0609 Other forms of dyspnea: Secondary | ICD-10-CM | POA: Diagnosis not present

## 2017-02-24 ENCOUNTER — Encounter: Payer: Self-pay | Admitting: Nurse Practitioner

## 2017-02-25 ENCOUNTER — Ambulatory Visit
Admission: RE | Admit: 2017-02-25 | Discharge: 2017-02-25 | Disposition: A | Discharge status: Home / Self Care | Payer: PPO | Source: Ambulatory Visit | Attending: Nurse Practitioner | Admitting: Nurse Practitioner

## 2017-02-25 ENCOUNTER — Inpatient Hospital Stay: Payer: PPO | Attending: Nurse Practitioner | Admitting: Nurse Practitioner

## 2017-02-25 DIAGNOSIS — D3501 Benign neoplasm of right adrenal gland: Secondary | ICD-10-CM | POA: Diagnosis not present

## 2017-02-25 DIAGNOSIS — Z87891 Personal history of nicotine dependence: Secondary | ICD-10-CM | POA: Insufficient documentation

## 2017-02-25 DIAGNOSIS — I2510 Atherosclerotic heart disease of native coronary artery without angina pectoris: Secondary | ICD-10-CM | POA: Diagnosis not present

## 2017-02-25 DIAGNOSIS — I700 Atherosclerosis of aorta: Secondary | ICD-10-CM | POA: Diagnosis not present

## 2017-02-25 DIAGNOSIS — Z122 Encounter for screening for malignant neoplasm of respiratory organs: Secondary | ICD-10-CM | POA: Insufficient documentation

## 2017-02-25 DIAGNOSIS — D3502 Benign neoplasm of left adrenal gland: Secondary | ICD-10-CM | POA: Insufficient documentation

## 2017-02-25 DIAGNOSIS — J439 Emphysema, unspecified: Secondary | ICD-10-CM | POA: Insufficient documentation

## 2017-02-25 NOTE — Progress Notes (Signed)
In accordance with CMS guidelines, patient has met eligibility criteria including age, absence of signs or symptoms of lung cancer.  Social History   Tobacco Use  . Smoking status: Former Smoker    Packs/day: 2.00    Years: 50.00    Pack years: 100.00    Types: Cigarettes    Last attempt to quit: 06/10/2015    Years since quitting: 1.7  . Smokeless tobacco: Never Used  Substance Use Topics  . Alcohol use: No    Alcohol/week: 0.0 oz  . Drug use: No     A shared decision-making session was conducted prior to the performance of CT scan. This includes one or more decision aids, includes benefits and harms of screening, follow-up diagnostic testing, over-diagnosis, false positive rate, and total radiation exposure.  Counseling on the importance of adherence to annual lung cancer LDCT screening, impact of co-morbidities, and ability or willingness to undergo diagnosis and treatment is imperative for compliance of the program.  Counseling on the importance of continued smoking cessation for former smokers; the importance of smoking cessation for current smokers, and information about tobacco cessation interventions have been given to patient including [place] and 1800 quit [place] programs.  Written order for lung cancer screening with LDCT has been given to the patient and any and all questions have been answered to the best of my abilities.   Yearly follow up will be coordinated by Burgess Estelle, Thoracic Navigator.  Beckey Rutter, DNP, AGNP-C 02/25/17 4:35 PM

## 2017-02-26 DIAGNOSIS — G8929 Other chronic pain: Secondary | ICD-10-CM | POA: Diagnosis not present

## 2017-02-26 DIAGNOSIS — M5442 Lumbago with sciatica, left side: Secondary | ICD-10-CM | POA: Diagnosis not present

## 2017-03-05 DIAGNOSIS — M5442 Lumbago with sciatica, left side: Secondary | ICD-10-CM | POA: Diagnosis not present

## 2017-03-05 DIAGNOSIS — G8929 Other chronic pain: Secondary | ICD-10-CM | POA: Diagnosis not present

## 2017-03-06 DIAGNOSIS — F325 Major depressive disorder, single episode, in full remission: Secondary | ICD-10-CM | POA: Diagnosis not present

## 2017-03-06 DIAGNOSIS — I739 Peripheral vascular disease, unspecified: Secondary | ICD-10-CM | POA: Diagnosis not present

## 2017-03-06 DIAGNOSIS — R739 Hyperglycemia, unspecified: Secondary | ICD-10-CM | POA: Diagnosis not present

## 2017-03-06 DIAGNOSIS — I69359 Hemiplegia and hemiparesis following cerebral infarction affecting unspecified side: Secondary | ICD-10-CM | POA: Diagnosis not present

## 2017-03-06 DIAGNOSIS — E785 Hyperlipidemia, unspecified: Secondary | ICD-10-CM | POA: Diagnosis not present

## 2017-03-06 DIAGNOSIS — E279 Disorder of adrenal gland, unspecified: Secondary | ICD-10-CM | POA: Diagnosis not present

## 2017-03-06 DIAGNOSIS — J449 Chronic obstructive pulmonary disease, unspecified: Secondary | ICD-10-CM | POA: Diagnosis not present

## 2017-03-12 DIAGNOSIS — M5442 Lumbago with sciatica, left side: Secondary | ICD-10-CM | POA: Diagnosis not present

## 2017-03-12 DIAGNOSIS — G8929 Other chronic pain: Secondary | ICD-10-CM | POA: Diagnosis not present

## 2017-03-26 DIAGNOSIS — M5442 Lumbago with sciatica, left side: Secondary | ICD-10-CM | POA: Diagnosis not present

## 2017-03-26 DIAGNOSIS — G8929 Other chronic pain: Secondary | ICD-10-CM | POA: Diagnosis not present

## 2017-04-04 ENCOUNTER — Other Ambulatory Visit: Payer: Self-pay | Admitting: Internal Medicine

## 2017-04-04 DIAGNOSIS — E278 Other specified disorders of adrenal gland: Secondary | ICD-10-CM

## 2017-04-09 DIAGNOSIS — G8929 Other chronic pain: Secondary | ICD-10-CM | POA: Diagnosis not present

## 2017-04-09 DIAGNOSIS — M5442 Lumbago with sciatica, left side: Secondary | ICD-10-CM | POA: Diagnosis not present

## 2017-04-14 DIAGNOSIS — I739 Peripheral vascular disease, unspecified: Secondary | ICD-10-CM | POA: Diagnosis not present

## 2017-04-14 DIAGNOSIS — Z01810 Encounter for preprocedural cardiovascular examination: Secondary | ICD-10-CM | POA: Diagnosis not present

## 2017-04-14 DIAGNOSIS — J449 Chronic obstructive pulmonary disease, unspecified: Secondary | ICD-10-CM | POA: Diagnosis not present

## 2017-04-14 DIAGNOSIS — Z8673 Personal history of transient ischemic attack (TIA), and cerebral infarction without residual deficits: Secondary | ICD-10-CM | POA: Diagnosis not present

## 2017-04-14 DIAGNOSIS — Z95818 Presence of other cardiac implants and grafts: Secondary | ICD-10-CM | POA: Diagnosis not present

## 2017-04-14 DIAGNOSIS — E785 Hyperlipidemia, unspecified: Secondary | ICD-10-CM | POA: Diagnosis not present

## 2017-04-16 DIAGNOSIS — G8929 Other chronic pain: Secondary | ICD-10-CM | POA: Diagnosis not present

## 2017-04-16 DIAGNOSIS — M5442 Lumbago with sciatica, left side: Secondary | ICD-10-CM | POA: Diagnosis not present

## 2017-04-17 ENCOUNTER — Other Ambulatory Visit: Payer: Self-pay | Admitting: Internal Medicine

## 2017-04-17 DIAGNOSIS — E279 Disorder of adrenal gland, unspecified: Principal | ICD-10-CM

## 2017-04-17 DIAGNOSIS — E278 Other specified disorders of adrenal gland: Secondary | ICD-10-CM

## 2017-05-12 ENCOUNTER — Ambulatory Visit
Admission: RE | Admit: 2017-05-12 | Discharge: 2017-05-12 | Disposition: A | Discharge status: Home / Self Care | Payer: PPO | Source: Ambulatory Visit | Attending: Unknown Physician Specialty | Admitting: Unknown Physician Specialty

## 2017-05-12 ENCOUNTER — Ambulatory Visit: Payer: PPO | Admitting: Anesthesiology

## 2017-05-12 ENCOUNTER — Encounter
Admission: RE | Disposition: A | Discharge status: Home / Self Care | Payer: Self-pay | Source: Ambulatory Visit | Attending: Unknown Physician Specialty

## 2017-05-12 ENCOUNTER — Encounter: Payer: Self-pay | Admitting: *Deleted

## 2017-05-12 DIAGNOSIS — I739 Peripheral vascular disease, unspecified: Secondary | ICD-10-CM | POA: Insufficient documentation

## 2017-05-12 DIAGNOSIS — K219 Gastro-esophageal reflux disease without esophagitis: Secondary | ICD-10-CM | POA: Diagnosis not present

## 2017-05-12 DIAGNOSIS — Z1211 Encounter for screening for malignant neoplasm of colon: Secondary | ICD-10-CM | POA: Diagnosis not present

## 2017-05-12 DIAGNOSIS — I6930 Unspecified sequelae of cerebral infarction: Secondary | ICD-10-CM | POA: Diagnosis not present

## 2017-05-12 DIAGNOSIS — Z79899 Other long term (current) drug therapy: Secondary | ICD-10-CM | POA: Diagnosis not present

## 2017-05-12 DIAGNOSIS — Z7982 Long term (current) use of aspirin: Secondary | ICD-10-CM | POA: Insufficient documentation

## 2017-05-12 DIAGNOSIS — K640 First degree hemorrhoids: Secondary | ICD-10-CM | POA: Insufficient documentation

## 2017-05-12 DIAGNOSIS — Z800 Family history of malignant neoplasm of digestive organs: Secondary | ICD-10-CM | POA: Insufficient documentation

## 2017-05-12 DIAGNOSIS — M1990 Unspecified osteoarthritis, unspecified site: Secondary | ICD-10-CM | POA: Insufficient documentation

## 2017-05-12 DIAGNOSIS — Z8612 Personal history of poliomyelitis: Secondary | ICD-10-CM | POA: Diagnosis not present

## 2017-05-12 DIAGNOSIS — F329 Major depressive disorder, single episode, unspecified: Secondary | ICD-10-CM | POA: Insufficient documentation

## 2017-05-12 DIAGNOSIS — J449 Chronic obstructive pulmonary disease, unspecified: Secondary | ICD-10-CM | POA: Diagnosis not present

## 2017-05-12 DIAGNOSIS — D122 Benign neoplasm of ascending colon: Secondary | ICD-10-CM | POA: Insufficient documentation

## 2017-05-12 DIAGNOSIS — Z87891 Personal history of nicotine dependence: Secondary | ICD-10-CM | POA: Diagnosis not present

## 2017-05-12 DIAGNOSIS — K635 Polyp of colon: Secondary | ICD-10-CM | POA: Diagnosis not present

## 2017-05-12 DIAGNOSIS — E785 Hyperlipidemia, unspecified: Secondary | ICD-10-CM | POA: Insufficient documentation

## 2017-05-12 HISTORY — DX: Presence of other cardiac implants and grafts: Z95.818

## 2017-05-12 HISTORY — DX: Hyperlipidemia, unspecified: E78.5

## 2017-05-12 HISTORY — DX: Gastro-esophageal reflux disease without esophagitis: K21.9

## 2017-05-12 HISTORY — DX: Cerebral infarction, unspecified: I63.9

## 2017-05-12 HISTORY — DX: Hemiplegia, unspecified affecting unspecified side: G81.90

## 2017-05-12 HISTORY — PX: COLONOSCOPY WITH PROPOFOL: SHX5780

## 2017-05-12 HISTORY — DX: Major depressive disorder, single episode, unspecified: F32.9

## 2017-05-12 HISTORY — DX: Depression, unspecified: F32.A

## 2017-05-12 HISTORY — DX: Chronic obstructive pulmonary disease, unspecified: J44.9

## 2017-05-12 HISTORY — DX: Peripheral vascular disease, unspecified: I73.9

## 2017-05-12 SURGERY — COLONOSCOPY WITH PROPOFOL
Anesthesia: General

## 2017-05-12 MED ORDER — EPHEDRINE SULFATE 50 MG/ML IJ SOLN
INTRAMUSCULAR | Status: DC | PRN
  Administered 2017-05-12: 2.5 mg via INTRAVENOUS
  Administered 2017-05-12: 7.5 mg via INTRAVENOUS

## 2017-05-12 MED ORDER — PROPOFOL 500 MG/50ML IV EMUL
INTRAVENOUS | Status: DC | PRN
  Administered 2017-05-12: 100 ug/kg/min via INTRAVENOUS

## 2017-05-12 MED ORDER — SODIUM CHLORIDE 0.9 % IV SOLN: INTRAVENOUS | Status: DC

## 2017-05-12 MED ORDER — SODIUM CHLORIDE 0.9 % IV SOLN
INTRAVENOUS | Status: DC
  Administered 2017-05-12 (×2): via INTRAVENOUS

## 2017-05-12 MED ORDER — PROPOFOL 10 MG/ML IV BOLUS
INTRAVENOUS | Status: DC | PRN
  Administered 2017-05-12 (×2): 30 mg via INTRAVENOUS
  Administered 2017-05-12: 50 mg via INTRAVENOUS

## 2017-05-12 NOTE — Anesthesia Post-op Follow-up Note (Signed)
Anesthesia QCDR form completed.        

## 2017-05-12 NOTE — Op Note (Signed)
[facility] Gastroenterology Patient Name: Jeremy Harrell Procedure Date: 05/12/2017 2:11 PM MRN: [id number] Account #: [id number] Date of Birth: [birth year] Admit Type: Outpatient Age: 67 Room: [facility] ENDO ROOM 3 Gender: Male Note Status: Finalized Procedure:            Colonoscopy Indications:          Family history of colon cancer in a first-degree                        relative Providers:            Manya Silvas, MD Referring MD:         Ramonita Lab, MD (Referring MD) Medicines:            Propofol per Anesthesia Complications:        No immediate complications. Procedure:            Pre-Anesthesia Assessment:                       - After reviewing the risks and benefits, the patient                        was deemed in satisfactory condition to undergo the                        procedure.                       After obtaining informed consent, the colonoscope was                        passed under direct vision. Throughout the procedure,                        the patient's blood pressure, pulse, and oxygen                        saturations were monitored continuously. The                        Colonoscope was introduced through the anus and                        advanced to the the cecum, identified by appendiceal                        orifice and ileocecal valve. The colonoscopy was                        performed without difficulty. The patient tolerated the                        procedure well. The quality of the bowel preparation                        was excellent. Findings:      Two sessile polyps were found in the ascending colon. The polyps were       diminutive in size. These polyps were removed with a jumbo cold forceps.       Resection and retrieval were complete.      Internal hemorrhoids were found  during endoscopy. The hemorrhoids were       small and Grade I (internal hemorrhoids that do not prolapse).      The exam was otherwise  without abnormality. Impression:           - Two diminutive polyps in the ascending colon, removed                        with a jumbo cold forceps. Resected and retrieved.                       - Internal hemorrhoids.                       - The examination was otherwise normal. Recommendation:       - Await pathology results. Manya Silvas, MD 05/12/2017 2:38:31 PM This report has been signed electronically. Number of Addenda: 0 Note Initiated On: 05/12/2017 2:11 PM Scope Withdrawal Time: 0 hours 9 minutes 6 seconds  Total Procedure Duration: 0 hours 18 minutes 17 seconds       [facility]

## 2017-05-12 NOTE — Transfer of Care (Signed)
Immediate Anesthesia Transfer of Care Note  Patient: Jeremy Handshoe Sr.  Procedure(s) Performed: COLONOSCOPY WITH PROPOFOL (N/A )  Patient Location: Endoscopy Unit  Anesthesia Type:General  Level of Consciousness: drowsy and patient cooperative  Airway & Oxygen Therapy: Patient Spontanous Breathing and Patient connected to nasal cannula oxygen  Post-op Assessment: Report given to RN, Post -op Vital signs reviewed and stable and Patient moving all extremities  Post vital signs: Reviewed and stable  Last Vitals:  Vitals:   05/12/17 1307 05/12/17 1439  BP: 120/77   Pulse: 71   Resp: 18   Temp: (!) 36.2 C (!) 36.4 C  SpO2: 98%     Last Pain:  Vitals:   05/12/17 1439  TempSrc: Tympanic         Complications: No apparent anesthesia complications

## 2017-05-12 NOTE — H&P (Signed)
Primary Care Physician:  Adin Hector, MD Primary Gastroenterologist:  Dr. Vira Agar  Pre-Procedure History & Physical: HPI:  Jeremy Harrell. is a 67 y.o. male is here for an colonoscopy.   Past Medical History:  Diagnosis Date  . Arthritis pain   . COPD (chronic obstructive pulmonary disease) ([place])   . Depression   . GERD (gastroesophageal reflux disease)   . Hemiparesis ([place])   . Hyperlipidemia   . Polio    age 26  . PVD (peripheral vascular disease) ([place])   . Status post placement of implantable loop recorder   . Stroke [facility])     Past Surgical History:  Procedure Laterality Date  . COLONOSCOPY  2013   Dr Vira Agar  . HERNIA REPAIR Right 03/28/14   inguinal hernia repair    Prior to Admission medications   Medication Sig Start Date End Date Taking? Authorizing Provider  aspirin EC 81 MG tablet Take 81 mg by mouth daily.   Yes [provider]  atorvastatin (LIPITOR) 80 MG tablet Take 80 mg by mouth daily.   Yes [provider]  FLUoxetine (PROZAC) 20 MG tablet Take 20 mg by mouth daily.   Yes [provider]  Multiple Vitamin (MULTIVITAMIN) tablet Take 1 tablet by mouth daily.   Yes [provider]  omeprazole (PRILOSEC) 20 MG capsule Take 20 mg by mouth daily.   Yes [provider]    Allergies as of 02/24/2017  . (No Known Allergies)    Family History  Problem Relation Age of Onset  . Cancer Father        colon    Social History   Socioeconomic History  . Marital status: Married    Spouse name: Not on file  . Number of children: Not on file  . Years of education: Not on file  . Highest education level: Not on file  Social Needs  . Financial resource strain: Not on file  . Food insecurity - worry: Not on file  . Food insecurity - inability: Not on file  . Transportation needs - medical: Not on file  . Transportation needs - non-medical: Not on file  Occupational History  . Not on file  Tobacco  Use  . Smoking status: Former Smoker    Packs/day: 2.00    Years: 50.00    Pack years: 100.00    Types: Cigarettes    Last attempt to quit: 06/10/2015    Years since quitting: 1.9  . Smokeless tobacco: Never Used  Substance and Sexual Activity  . Alcohol use: No    Alcohol/week: 0.0 oz  . Drug use: No  . Sexual activity: Not on file  Other Topics Concern  . Not on file  Social History Narrative  . Not on file    Review of Systems: See HPI, otherwise negative ROS  Physical Exam: BP 120/77   Pulse 71   Temp (!) 97.1 F (36.2 C) (Tympanic)   Resp 18   Ht 5\' 6"  (1.676 m)   Wt 67.1 kg (148 lb)   SpO2 98%   BMI 23.89 kg/m  General:   Alert,  pleasant and cooperative in NAD Head:  Normocephalic and atraumatic. Neck:  Supple; no masses or thyromegaly. Lungs:  Clear throughout to auscultation.    Heart:  Regular rate and rhythm. Abdomen:  Soft, nontender and nondistended. Normal bowel sounds, without guarding, and without rebound.   Neurologic:  Alert and  oriented x4;  grossly normal  neurologically.  Impression/Plan: Jeremy Able Sr. is here for an colonoscopy to be performed for FH colon cancer in father.  Risks, benefits, limitations, and alternatives regarding  colonoscopy have been reviewed with the patient.  Questions have been answered.  All parties agreeable.   Gaylyn Cheers, MD  05/12/2017, 2:10 PM

## 2017-05-12 NOTE — Anesthesia Postprocedure Evaluation (Signed)
Anesthesia Post Note  Patient: Jeremy Honeywell Sr.  Procedure(s) Performed: COLONOSCOPY WITH PROPOFOL (N/A )  Patient location during evaluation: PACU Anesthesia Type: General Level of consciousness: awake and alert and oriented Pain management: pain level controlled Vital Signs Assessment: post-procedure vital signs reviewed and stable Respiratory status: spontaneous breathing Cardiovascular status: blood pressure returned to baseline Anesthetic complications: no     Last Vitals:  Vitals:   05/12/17 1307 05/12/17 1439  BP: 120/77 99/61  Pulse: 71 70  Resp: 18 17  Temp: (!) 36.2 C (!) 36.4 C  SpO2: 98% 99%    Last Pain:  Vitals:   05/12/17 1439  TempSrc: Tympanic                 Theophilus Walz

## 2017-05-12 NOTE — Anesthesia Preprocedure Evaluation (Addendum)
Anesthesia Evaluation  Patient identified by MRN, date of birth, ID band Patient awake    Reviewed: Allergy & Precautions, NPO status , Patient's Chart, lab work & pertinent test results  Airway Mallampati: II  TM Distance: >3 FB     Dental   Pulmonary COPD, former smoker,    Pulmonary exam normal        Cardiovascular + Peripheral Vascular Disease  Normal cardiovascular exam     Neuro/Psych PSYCHIATRIC DISORDERS Depression  Neuromuscular disease CVA, Residual Symptoms    GI/Hepatic Neg liver ROS, GERD  Medicated and Controlled,  Endo/Other  negative endocrine ROS  Renal/GU negative Renal ROS     Musculoskeletal  (+) Arthritis , Osteoarthritis,    Abdominal Normal abdominal exam  (+)   Peds  Hematology negative hematology ROS (+)   Anesthesia Other Findings Past Medical History: No date: Arthritis pain No date: COPD (chronic obstructive pulmonary disease) ([facility]) No date: Depression No date: GERD (gastroesophageal reflux disease) No date: Hemiparesis ([facility]) No date: Hyperlipidemia No date: Polio     Comment:  age 66 No date: PVD (peripheral vascular disease) ([place]) No date: Status post placement of implantable loop recorder No date: Stroke [facility])  Reproductive/Obstetrics                             Anesthesia Physical Anesthesia Plan  ASA: III  Anesthesia Plan: General   Post-op Pain Management:    Induction: Intravenous  PONV Risk Score and Plan:   Airway Management Planned: Nasal Cannula  Additional Equipment:   Intra-op Plan:   Post-operative Plan:   Informed Consent: I have reviewed the patients History and Physical, chart, labs and discussed the procedure including the risks, benefits and alternatives for the proposed anesthesia with the patient or authorized representative who has indicated his/her understanding and acceptance.   Dental advisory given  Plan Discussed  with: CRNA and Surgeon  Anesthesia Plan Comments:         Anesthesia Quick Evaluation

## 2017-05-13 ENCOUNTER — Encounter: Payer: Self-pay | Admitting: Unknown Physician Specialty

## 2017-05-15 LAB — SURGICAL PATHOLOGY

## 2017-06-04 DIAGNOSIS — Z4509 Encounter for adjustment and management of other cardiac device: Secondary | ICD-10-CM | POA: Diagnosis not present

## 2017-06-04 DIAGNOSIS — Z95818 Presence of other cardiac implants and grafts: Secondary | ICD-10-CM | POA: Diagnosis not present

## 2017-06-04 DIAGNOSIS — Z8673 Personal history of transient ischemic attack (TIA), and cerebral infarction without residual deficits: Secondary | ICD-10-CM | POA: Diagnosis not present

## 2017-08-20 ENCOUNTER — Other Ambulatory Visit
Admission: RE | Admit: 2017-08-20 | Discharge: 2017-08-20 | Disposition: A | Discharge status: Home / Self Care | Payer: PPO | Source: Ambulatory Visit | Attending: Internal Medicine | Admitting: Internal Medicine

## 2017-08-20 ENCOUNTER — Encounter (INDEPENDENT_AMBULATORY_CARE_PROVIDER_SITE_OTHER): Payer: Self-pay

## 2017-08-20 ENCOUNTER — Ambulatory Visit
Admission: RE | Admit: 2017-08-20 | Discharge: 2017-08-20 | Disposition: A | Discharge status: Home / Self Care | Payer: PPO | Source: Ambulatory Visit | Attending: Internal Medicine | Admitting: Internal Medicine

## 2017-08-20 DIAGNOSIS — D3501 Benign neoplasm of right adrenal gland: Secondary | ICD-10-CM | POA: Insufficient documentation

## 2017-08-20 DIAGNOSIS — D3502 Benign neoplasm of left adrenal gland: Secondary | ICD-10-CM | POA: Diagnosis not present

## 2017-08-20 DIAGNOSIS — I700 Atherosclerosis of aorta: Secondary | ICD-10-CM | POA: Insufficient documentation

## 2017-08-20 DIAGNOSIS — E278 Other specified disorders of adrenal gland: Secondary | ICD-10-CM

## 2017-08-20 DIAGNOSIS — Z9889 Other specified postprocedural states: Secondary | ICD-10-CM | POA: Insufficient documentation

## 2017-08-20 DIAGNOSIS — K861 Other chronic pancreatitis: Secondary | ICD-10-CM | POA: Diagnosis not present

## 2017-08-20 DIAGNOSIS — N200 Calculus of kidney: Secondary | ICD-10-CM | POA: Insufficient documentation

## 2017-08-20 DIAGNOSIS — E279 Disorder of adrenal gland, unspecified: Secondary | ICD-10-CM

## 2017-08-20 LAB — CREATININE, SERUM: CREATININE: 0.99 mg/dL (ref 0.61–1.24)

## 2017-08-20 MED ORDER — IOPAMIDOL (ISOVUE-300) INJECTION 61%
50.0000 mL | Freq: Once | INTRAVENOUS | Status: AC | PRN
  Administered 2017-08-20: 100 mL via INTRAVENOUS

## 2017-08-27 DIAGNOSIS — R739 Hyperglycemia, unspecified: Secondary | ICD-10-CM | POA: Diagnosis not present

## 2017-08-27 DIAGNOSIS — E785 Hyperlipidemia, unspecified: Secondary | ICD-10-CM | POA: Diagnosis not present

## 2017-08-27 DIAGNOSIS — I739 Peripheral vascular disease, unspecified: Secondary | ICD-10-CM | POA: Diagnosis not present

## 2017-09-03 DIAGNOSIS — R739 Hyperglycemia, unspecified: Secondary | ICD-10-CM | POA: Diagnosis not present

## 2017-09-03 DIAGNOSIS — F325 Major depressive disorder, single episode, in full remission: Secondary | ICD-10-CM | POA: Diagnosis not present

## 2017-09-03 DIAGNOSIS — K7469 Other cirrhosis of liver: Secondary | ICD-10-CM | POA: Diagnosis not present

## 2017-09-03 DIAGNOSIS — E279 Disorder of adrenal gland, unspecified: Secondary | ICD-10-CM | POA: Diagnosis not present

## 2017-09-03 DIAGNOSIS — I739 Peripheral vascular disease, unspecified: Secondary | ICD-10-CM | POA: Diagnosis not present

## 2017-09-03 DIAGNOSIS — J449 Chronic obstructive pulmonary disease, unspecified: Secondary | ICD-10-CM | POA: Diagnosis not present

## 2017-09-03 DIAGNOSIS — N200 Calculus of kidney: Secondary | ICD-10-CM | POA: Diagnosis not present

## 2017-09-03 DIAGNOSIS — E785 Hyperlipidemia, unspecified: Secondary | ICD-10-CM | POA: Diagnosis not present

## 2017-09-03 DIAGNOSIS — I700 Atherosclerosis of aorta: Secondary | ICD-10-CM | POA: Diagnosis not present

## 2017-09-03 DIAGNOSIS — I69359 Hemiplegia and hemiparesis following cerebral infarction affecting unspecified side: Secondary | ICD-10-CM | POA: Diagnosis not present

## 2017-09-03 DIAGNOSIS — K861 Other chronic pancreatitis: Secondary | ICD-10-CM | POA: Diagnosis not present

## 2017-10-02 ENCOUNTER — Other Ambulatory Visit: Payer: Self-pay | Admitting: Student

## 2017-10-02 DIAGNOSIS — R932 Abnormal findings on diagnostic imaging of liver and biliary tract: Secondary | ICD-10-CM | POA: Diagnosis not present

## 2017-10-02 DIAGNOSIS — K860 Alcohol-induced chronic pancreatitis: Secondary | ICD-10-CM | POA: Diagnosis not present

## 2017-10-08 ENCOUNTER — Other Ambulatory Visit: Payer: Self-pay | Admitting: Student

## 2017-10-08 DIAGNOSIS — K7400 Hepatic fibrosis, unspecified: Secondary | ICD-10-CM

## 2017-10-08 DIAGNOSIS — R932 Abnormal findings on diagnostic imaging of liver and biliary tract: Secondary | ICD-10-CM

## 2017-10-10 ENCOUNTER — Ambulatory Visit
Admission: RE | Admit: 2017-10-10 | Discharge: 2017-10-10 | Disposition: A | Discharge status: Home / Self Care | Payer: PPO | Source: Ambulatory Visit | Attending: Student | Admitting: Student

## 2017-10-10 DIAGNOSIS — K769 Liver disease, unspecified: Secondary | ICD-10-CM | POA: Insufficient documentation

## 2017-10-10 DIAGNOSIS — R932 Abnormal findings on diagnostic imaging of liver and biliary tract: Secondary | ICD-10-CM | POA: Diagnosis not present

## 2017-10-10 DIAGNOSIS — K760 Fatty (change of) liver, not elsewhere classified: Secondary | ICD-10-CM | POA: Diagnosis not present

## 2017-10-10 DIAGNOSIS — K7400 Hepatic fibrosis, unspecified: Secondary | ICD-10-CM

## 2017-10-13 DIAGNOSIS — Z95818 Presence of other cardiac implants and grafts: Secondary | ICD-10-CM | POA: Diagnosis not present

## 2017-10-13 DIAGNOSIS — E785 Hyperlipidemia, unspecified: Secondary | ICD-10-CM | POA: Diagnosis not present

## 2017-10-13 DIAGNOSIS — Z8673 Personal history of transient ischemic attack (TIA), and cerebral infarction without residual deficits: Secondary | ICD-10-CM | POA: Diagnosis not present

## 2017-10-29 DIAGNOSIS — Z23 Encounter for immunization: Secondary | ICD-10-CM | POA: Diagnosis not present

## 2017-11-18 DIAGNOSIS — D3501 Benign neoplasm of right adrenal gland: Secondary | ICD-10-CM | POA: Diagnosis not present

## 2017-11-18 DIAGNOSIS — D3502 Benign neoplasm of left adrenal gland: Secondary | ICD-10-CM | POA: Diagnosis not present

## 2017-11-26 DIAGNOSIS — D3501 Benign neoplasm of right adrenal gland: Secondary | ICD-10-CM | POA: Diagnosis not present

## 2017-11-26 DIAGNOSIS — D3502 Benign neoplasm of left adrenal gland: Secondary | ICD-10-CM | POA: Diagnosis not present

## 2017-11-28 DIAGNOSIS — E785 Hyperlipidemia, unspecified: Secondary | ICD-10-CM | POA: Diagnosis not present

## 2017-11-28 DIAGNOSIS — E278 Other specified disorders of adrenal gland: Secondary | ICD-10-CM | POA: Diagnosis not present

## 2017-11-28 DIAGNOSIS — K7469 Other cirrhosis of liver: Secondary | ICD-10-CM | POA: Diagnosis not present

## 2017-11-28 DIAGNOSIS — R739 Hyperglycemia, unspecified: Secondary | ICD-10-CM | POA: Diagnosis not present

## 2017-12-01 DIAGNOSIS — Z23 Encounter for immunization: Secondary | ICD-10-CM | POA: Diagnosis not present

## 2017-12-05 DIAGNOSIS — R739 Hyperglycemia, unspecified: Secondary | ICD-10-CM | POA: Diagnosis not present

## 2017-12-05 DIAGNOSIS — I739 Peripheral vascular disease, unspecified: Secondary | ICD-10-CM | POA: Diagnosis not present

## 2017-12-05 DIAGNOSIS — K860 Alcohol-induced chronic pancreatitis: Secondary | ICD-10-CM | POA: Diagnosis not present

## 2017-12-05 DIAGNOSIS — I700 Atherosclerosis of aorta: Secondary | ICD-10-CM | POA: Diagnosis not present

## 2017-12-05 DIAGNOSIS — F325 Major depressive disorder, single episode, in full remission: Secondary | ICD-10-CM | POA: Diagnosis not present

## 2017-12-05 DIAGNOSIS — J449 Chronic obstructive pulmonary disease, unspecified: Secondary | ICD-10-CM | POA: Diagnosis not present

## 2017-12-05 DIAGNOSIS — E278 Other specified disorders of adrenal gland: Secondary | ICD-10-CM | POA: Diagnosis not present

## 2017-12-05 DIAGNOSIS — I69359 Hemiplegia and hemiparesis following cerebral infarction affecting unspecified side: Secondary | ICD-10-CM | POA: Diagnosis not present

## 2017-12-05 DIAGNOSIS — E785 Hyperlipidemia, unspecified: Secondary | ICD-10-CM | POA: Diagnosis not present

## 2017-12-05 DIAGNOSIS — Z8673 Personal history of transient ischemic attack (TIA), and cerebral infarction without residual deficits: Secondary | ICD-10-CM | POA: Diagnosis not present

## 2017-12-05 DIAGNOSIS — N200 Calculus of kidney: Secondary | ICD-10-CM | POA: Diagnosis not present

## 2017-12-05 DIAGNOSIS — K7469 Other cirrhosis of liver: Secondary | ICD-10-CM | POA: Diagnosis not present

## 2017-12-10 DIAGNOSIS — D3502 Benign neoplasm of left adrenal gland: Secondary | ICD-10-CM | POA: Diagnosis not present

## 2017-12-10 DIAGNOSIS — D3501 Benign neoplasm of right adrenal gland: Secondary | ICD-10-CM | POA: Diagnosis not present

## 2018-01-23 ENCOUNTER — Other Ambulatory Visit: Payer: Self-pay | Admitting: Student

## 2018-01-23 DIAGNOSIS — R932 Abnormal findings on diagnostic imaging of liver and biliary tract: Secondary | ICD-10-CM

## 2018-01-29 DIAGNOSIS — Z95818 Presence of other cardiac implants and grafts: Secondary | ICD-10-CM | POA: Diagnosis not present

## 2018-01-29 DIAGNOSIS — Z4509 Encounter for adjustment and management of other cardiac device: Secondary | ICD-10-CM | POA: Diagnosis not present

## 2018-01-29 DIAGNOSIS — Z8673 Personal history of transient ischemic attack (TIA), and cerebral infarction without residual deficits: Secondary | ICD-10-CM | POA: Diagnosis not present

## 2018-02-14 ENCOUNTER — Telehealth: Payer: Self-pay

## 2018-02-14 NOTE — Telephone Encounter (Signed)
Call pt regarding lung screening. Left message to return call. 

## 2018-02-16 ENCOUNTER — Ambulatory Visit
Admission: RE | Admit: 2018-02-16 | Discharge: 2018-02-16 | Disposition: A | Discharge status: Home / Self Care | Payer: PPO | Source: Ambulatory Visit | Attending: Student | Admitting: Student

## 2018-02-16 DIAGNOSIS — R932 Abnormal findings on diagnostic imaging of liver and biliary tract: Secondary | ICD-10-CM

## 2018-02-16 DIAGNOSIS — K760 Fatty (change of) liver, not elsewhere classified: Secondary | ICD-10-CM | POA: Insufficient documentation

## 2018-02-16 DIAGNOSIS — D3502 Benign neoplasm of left adrenal gland: Secondary | ICD-10-CM | POA: Diagnosis not present

## 2018-02-16 DIAGNOSIS — D3501 Benign neoplasm of right adrenal gland: Secondary | ICD-10-CM | POA: Diagnosis not present

## 2018-02-16 LAB — POCT I-STAT CREATININE: CREATININE: 0.9 mg/dL (ref 0.61–1.24)

## 2018-02-16 MED ORDER — GADOBUTROL 1 MMOL/ML IV SOLN
6.5000 mL | Freq: Once | INTRAVENOUS | Status: AC | PRN
  Administered 2018-02-16: 6.5 mL via INTRAVENOUS

## 2018-02-21 ENCOUNTER — Telehealth: Payer: Self-pay

## 2018-02-21 NOTE — Telephone Encounter (Signed)
Call pt regarding lung screening. Pt is a former smoker. Pt would like scan mid morning any day. Pt also denies any new health issues.

## 2018-02-23 ENCOUNTER — Telehealth: Payer: Self-pay | Admitting: *Deleted

## 2018-02-23 DIAGNOSIS — Z122 Encounter for screening for malignant neoplasm of respiratory organs: Secondary | ICD-10-CM

## 2018-02-23 DIAGNOSIS — Z87891 Personal history of nicotine dependence: Secondary | ICD-10-CM

## 2018-02-23 NOTE — Telephone Encounter (Signed)
Patient has been notified that annual lung cancer screening low dose CT scan is due currently or will be in near future. Confirmed that patient is within the age range of 55-77, and asymptomatic, (no signs or symptoms of lung cancer). Patient denies illness that would prevent curative treatment for lung cancer if found. Verified smoking history, (former, quit 2017, 100 pack year). The shared decision making visit was done 02/25/17. Patient is agreeable for CT scan being scheduled.

## 2018-02-27 ENCOUNTER — Ambulatory Visit: Admission: RE | Admit: 2018-02-27 | Payer: PPO | Source: Ambulatory Visit

## 2018-03-04 ENCOUNTER — Telehealth: Payer: Self-pay | Admitting: *Deleted

## 2018-03-04 NOTE — Telephone Encounter (Signed)
Attempted to reschedule no show lung screening appt. Voicemail left for patient.

## 2018-03-09 ENCOUNTER — Encounter: Payer: Self-pay | Admitting: *Deleted

## 2018-03-12 DIAGNOSIS — Z125 Encounter for screening for malignant neoplasm of prostate: Secondary | ICD-10-CM | POA: Diagnosis not present

## 2018-03-12 DIAGNOSIS — I700 Atherosclerosis of aorta: Secondary | ICD-10-CM | POA: Diagnosis not present

## 2018-03-12 DIAGNOSIS — R739 Hyperglycemia, unspecified: Secondary | ICD-10-CM | POA: Diagnosis not present

## 2018-03-19 DIAGNOSIS — R739 Hyperglycemia, unspecified: Secondary | ICD-10-CM | POA: Diagnosis not present

## 2018-03-19 DIAGNOSIS — E278 Other specified disorders of adrenal gland: Secondary | ICD-10-CM | POA: Diagnosis not present

## 2018-03-19 DIAGNOSIS — F325 Major depressive disorder, single episode, in full remission: Secondary | ICD-10-CM | POA: Diagnosis not present

## 2018-03-19 DIAGNOSIS — I69359 Hemiplegia and hemiparesis following cerebral infarction affecting unspecified side: Secondary | ICD-10-CM | POA: Diagnosis not present

## 2018-03-19 DIAGNOSIS — J449 Chronic obstructive pulmonary disease, unspecified: Secondary | ICD-10-CM | POA: Diagnosis not present

## 2018-03-19 DIAGNOSIS — I739 Peripheral vascular disease, unspecified: Secondary | ICD-10-CM | POA: Diagnosis not present

## 2018-03-19 DIAGNOSIS — I700 Atherosclerosis of aorta: Secondary | ICD-10-CM | POA: Diagnosis not present

## 2018-03-19 DIAGNOSIS — K860 Alcohol-induced chronic pancreatitis: Secondary | ICD-10-CM | POA: Diagnosis not present

## 2018-03-19 DIAGNOSIS — Z0000 Encounter for general adult medical examination without abnormal findings: Secondary | ICD-10-CM | POA: Diagnosis not present

## 2018-03-19 DIAGNOSIS — E785 Hyperlipidemia, unspecified: Secondary | ICD-10-CM | POA: Diagnosis not present

## 2018-03-19 DIAGNOSIS — K7469 Other cirrhosis of liver: Secondary | ICD-10-CM | POA: Diagnosis not present

## 2018-03-25 DIAGNOSIS — K860 Alcohol-induced chronic pancreatitis: Secondary | ICD-10-CM | POA: Diagnosis not present

## 2018-03-25 DIAGNOSIS — K760 Fatty (change of) liver, not elsewhere classified: Secondary | ICD-10-CM | POA: Diagnosis not present

## 2018-04-13 DIAGNOSIS — E785 Hyperlipidemia, unspecified: Secondary | ICD-10-CM | POA: Diagnosis not present

## 2018-04-13 DIAGNOSIS — Z8673 Personal history of transient ischemic attack (TIA), and cerebral infarction without residual deficits: Secondary | ICD-10-CM | POA: Diagnosis not present

## 2018-04-13 DIAGNOSIS — Z95818 Presence of other cardiac implants and grafts: Secondary | ICD-10-CM | POA: Diagnosis not present

## 2018-05-04 DIAGNOSIS — Z23 Encounter for immunization: Secondary | ICD-10-CM | POA: Diagnosis not present

## 2018-05-13 DIAGNOSIS — Z95818 Presence of other cardiac implants and grafts: Secondary | ICD-10-CM | POA: Diagnosis not present

## 2018-06-11 DIAGNOSIS — Z4509 Encounter for adjustment and management of other cardiac device: Secondary | ICD-10-CM | POA: Diagnosis not present

## 2018-06-11 DIAGNOSIS — Z8673 Personal history of transient ischemic attack (TIA), and cerebral infarction without residual deficits: Secondary | ICD-10-CM | POA: Diagnosis not present

## 2018-09-11 DIAGNOSIS — R739 Hyperglycemia, unspecified: Secondary | ICD-10-CM | POA: Diagnosis not present

## 2018-09-11 DIAGNOSIS — I69359 Hemiplegia and hemiparesis following cerebral infarction affecting unspecified side: Secondary | ICD-10-CM | POA: Diagnosis not present

## 2018-09-11 DIAGNOSIS — I739 Peripheral vascular disease, unspecified: Secondary | ICD-10-CM | POA: Diagnosis not present

## 2018-09-11 DIAGNOSIS — E785 Hyperlipidemia, unspecified: Secondary | ICD-10-CM | POA: Diagnosis not present

## 2018-09-18 DIAGNOSIS — K7469 Other cirrhosis of liver: Secondary | ICD-10-CM | POA: Diagnosis not present

## 2018-09-18 DIAGNOSIS — J449 Chronic obstructive pulmonary disease, unspecified: Secondary | ICD-10-CM | POA: Diagnosis not present

## 2018-09-18 DIAGNOSIS — I739 Peripheral vascular disease, unspecified: Secondary | ICD-10-CM | POA: Diagnosis not present

## 2018-09-18 DIAGNOSIS — K860 Alcohol-induced chronic pancreatitis: Secondary | ICD-10-CM | POA: Diagnosis not present

## 2018-09-18 DIAGNOSIS — Z8673 Personal history of transient ischemic attack (TIA), and cerebral infarction without residual deficits: Secondary | ICD-10-CM | POA: Diagnosis not present

## 2018-09-18 DIAGNOSIS — I700 Atherosclerosis of aorta: Secondary | ICD-10-CM | POA: Diagnosis not present

## 2018-09-18 DIAGNOSIS — E278 Other specified disorders of adrenal gland: Secondary | ICD-10-CM | POA: Diagnosis not present

## 2018-09-18 DIAGNOSIS — Z125 Encounter for screening for malignant neoplasm of prostate: Secondary | ICD-10-CM | POA: Diagnosis not present

## 2018-09-18 DIAGNOSIS — R739 Hyperglycemia, unspecified: Secondary | ICD-10-CM | POA: Diagnosis not present

## 2018-09-18 DIAGNOSIS — E785 Hyperlipidemia, unspecified: Secondary | ICD-10-CM | POA: Diagnosis not present

## 2018-09-18 DIAGNOSIS — I69359 Hemiplegia and hemiparesis following cerebral infarction affecting unspecified side: Secondary | ICD-10-CM | POA: Diagnosis not present

## 2018-09-18 DIAGNOSIS — F325 Major depressive disorder, single episode, in full remission: Secondary | ICD-10-CM | POA: Diagnosis not present

## 2018-10-22 DIAGNOSIS — D649 Anemia, unspecified: Secondary | ICD-10-CM | POA: Diagnosis not present

## 2018-11-05 DIAGNOSIS — I69359 Hemiplegia and hemiparesis following cerebral infarction affecting unspecified side: Secondary | ICD-10-CM | POA: Diagnosis not present

## 2018-11-05 DIAGNOSIS — Z959 Presence of cardiac and vascular implant and graft, unspecified: Secondary | ICD-10-CM | POA: Diagnosis not present

## 2018-11-05 DIAGNOSIS — Z4509 Encounter for adjustment and management of other cardiac device: Secondary | ICD-10-CM | POA: Diagnosis not present

## 2018-11-05 DIAGNOSIS — Z8673 Personal history of transient ischemic attack (TIA), and cerebral infarction without residual deficits: Secondary | ICD-10-CM | POA: Diagnosis not present

## 2018-11-09 ENCOUNTER — Other Ambulatory Visit: Payer: Self-pay

## 2018-11-16 DIAGNOSIS — Z95818 Presence of other cardiac implants and grafts: Secondary | ICD-10-CM | POA: Diagnosis not present

## 2018-11-16 DIAGNOSIS — Z4509 Encounter for adjustment and management of other cardiac device: Secondary | ICD-10-CM | POA: Diagnosis not present

## 2018-11-16 DIAGNOSIS — I639 Cerebral infarction, unspecified: Secondary | ICD-10-CM | POA: Diagnosis not present

## 2019-01-15 DIAGNOSIS — Z23 Encounter for immunization: Secondary | ICD-10-CM | POA: Diagnosis not present

## 2019-03-19 DIAGNOSIS — R739 Hyperglycemia, unspecified: Secondary | ICD-10-CM | POA: Diagnosis not present

## 2019-03-19 DIAGNOSIS — E785 Hyperlipidemia, unspecified: Secondary | ICD-10-CM | POA: Diagnosis not present

## 2019-03-19 DIAGNOSIS — Z125 Encounter for screening for malignant neoplasm of prostate: Secondary | ICD-10-CM | POA: Diagnosis not present

## 2019-03-19 DIAGNOSIS — D649 Anemia, unspecified: Secondary | ICD-10-CM | POA: Diagnosis not present

## 2019-03-26 DIAGNOSIS — J449 Chronic obstructive pulmonary disease, unspecified: Secondary | ICD-10-CM | POA: Diagnosis not present

## 2019-03-26 DIAGNOSIS — Z0000 Encounter for general adult medical examination without abnormal findings: Secondary | ICD-10-CM | POA: Diagnosis not present

## 2019-03-26 DIAGNOSIS — I739 Peripheral vascular disease, unspecified: Secondary | ICD-10-CM | POA: Diagnosis not present

## 2019-03-26 DIAGNOSIS — E278 Other specified disorders of adrenal gland: Secondary | ICD-10-CM | POA: Diagnosis not present

## 2019-03-26 DIAGNOSIS — Z8673 Personal history of transient ischemic attack (TIA), and cerebral infarction without residual deficits: Secondary | ICD-10-CM | POA: Diagnosis not present

## 2019-03-26 DIAGNOSIS — F325 Major depressive disorder, single episode, in full remission: Secondary | ICD-10-CM | POA: Diagnosis not present

## 2019-03-26 DIAGNOSIS — K7469 Other cirrhosis of liver: Secondary | ICD-10-CM | POA: Diagnosis not present

## 2019-03-26 DIAGNOSIS — K860 Alcohol-induced chronic pancreatitis: Secondary | ICD-10-CM | POA: Diagnosis not present

## 2019-03-26 DIAGNOSIS — I69359 Hemiplegia and hemiparesis following cerebral infarction affecting unspecified side: Secondary | ICD-10-CM | POA: Diagnosis not present

## 2019-03-26 DIAGNOSIS — E785 Hyperlipidemia, unspecified: Secondary | ICD-10-CM | POA: Diagnosis not present

## 2019-03-26 DIAGNOSIS — I700 Atherosclerosis of aorta: Secondary | ICD-10-CM | POA: Diagnosis not present

## 2019-03-26 DIAGNOSIS — R739 Hyperglycemia, unspecified: Secondary | ICD-10-CM | POA: Diagnosis not present

## 2019-03-29 ENCOUNTER — Other Ambulatory Visit: Payer: Self-pay | Admitting: Internal Medicine

## 2019-03-29 DIAGNOSIS — K7469 Other cirrhosis of liver: Secondary | ICD-10-CM

## 2019-04-08 IMAGING — US US ABDOMEN COMPLETE W/ ELASTOGRAPHY
1 series · 12 of 25 positions shown · non-contrast
Comparison: None.

CLINICAL DATA: Abnormal appearance of liver on recent CT. Possible
cirrhosis.



[Series 1: us abdomen complete w/ elastography · 0.17mm/px · 12 of 110 slices shown]
[im 5/110]
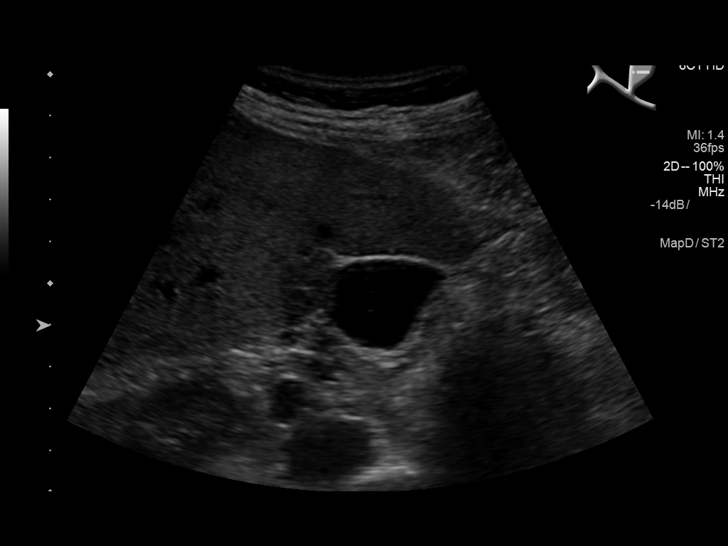
[im 14/110]
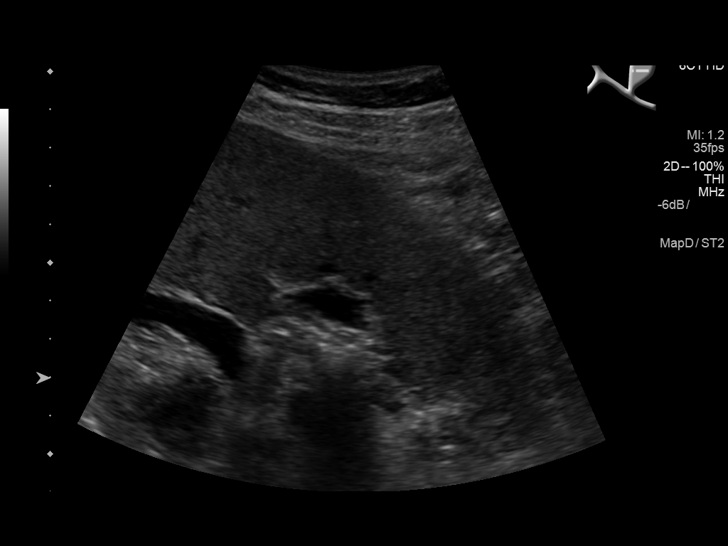
[im 23/110]
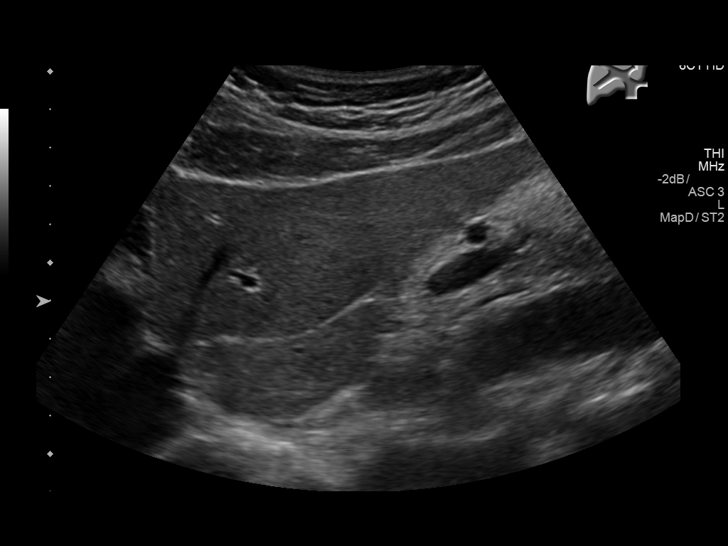
[im 32/110]
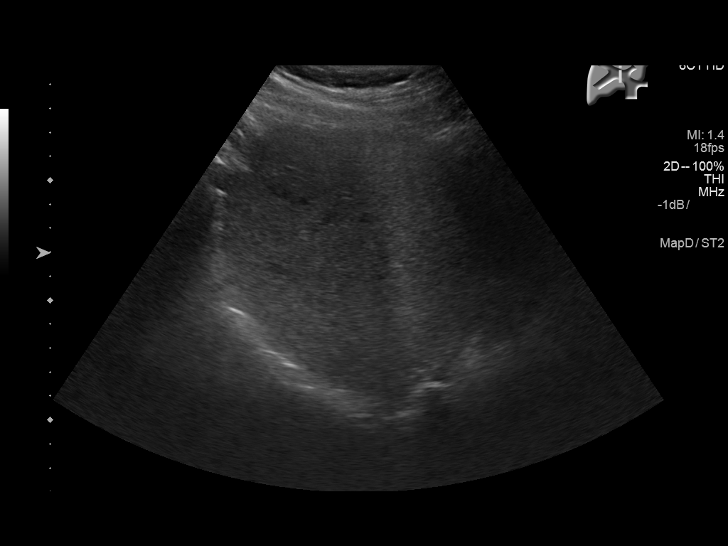
[im 41/110]
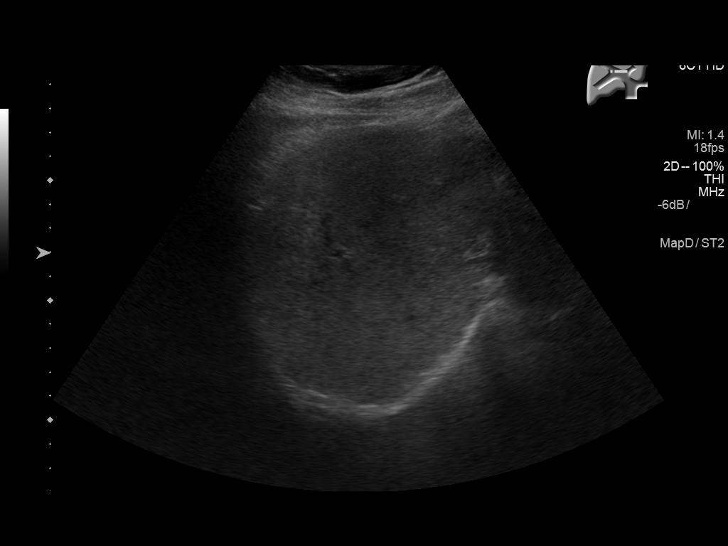
[im 50/110]
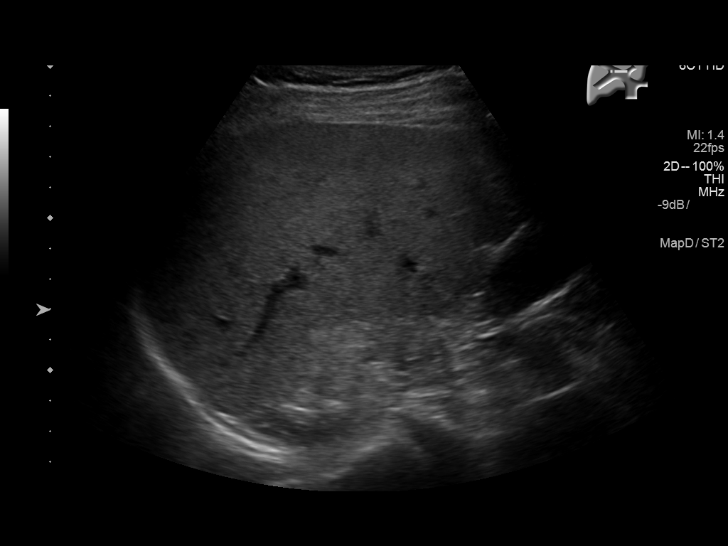
[im 60/110]
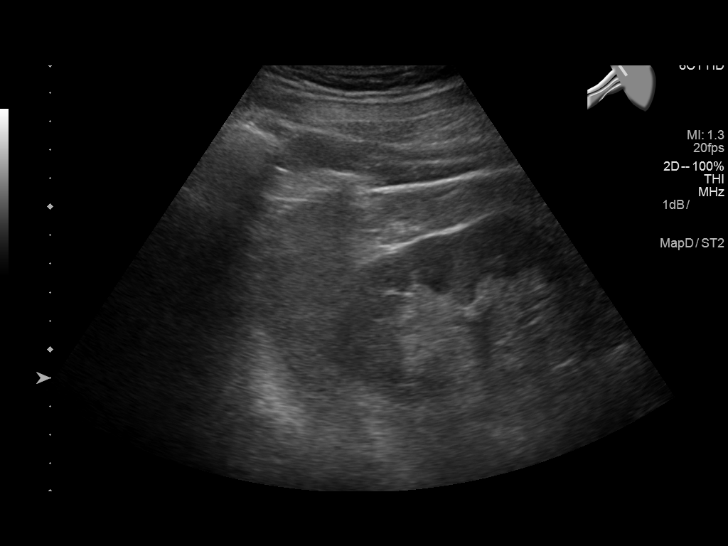
[im 69/110]
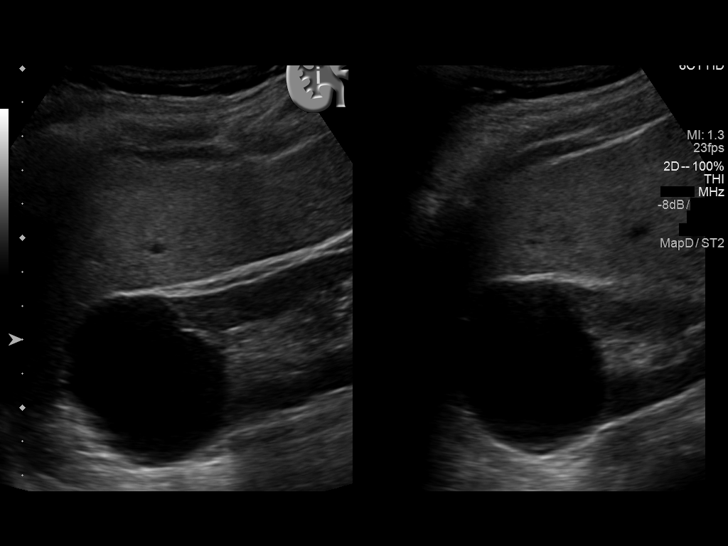
[im 78/110]
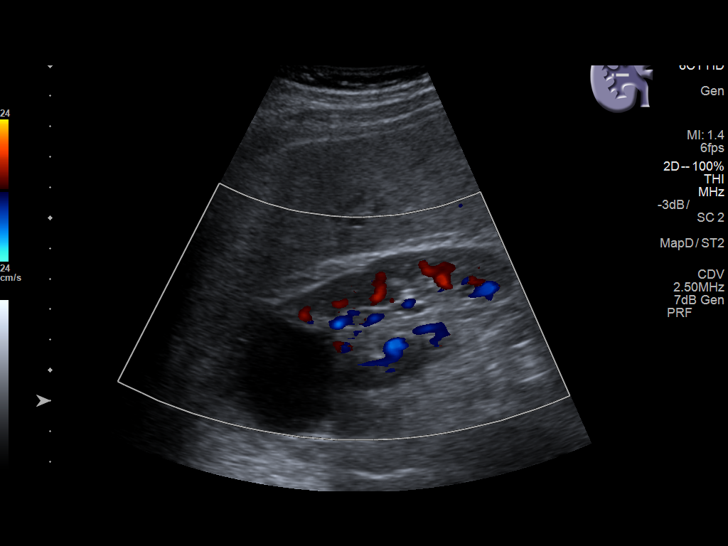
[im 87/110]
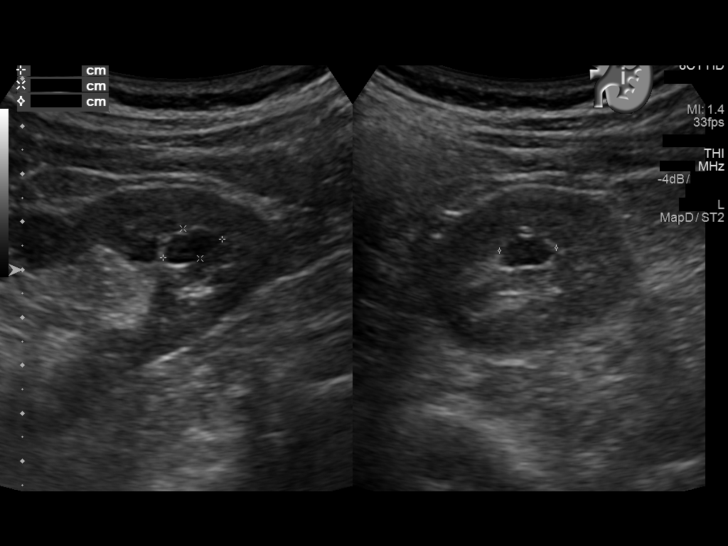
[im 96/110]
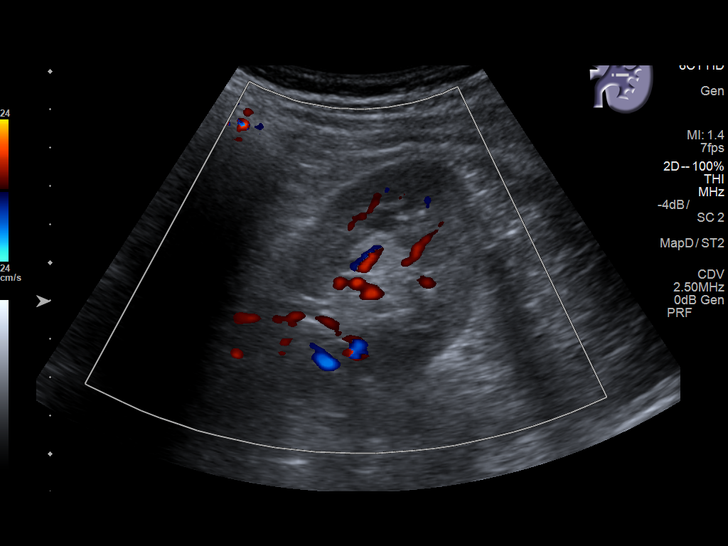
[im 105/110]
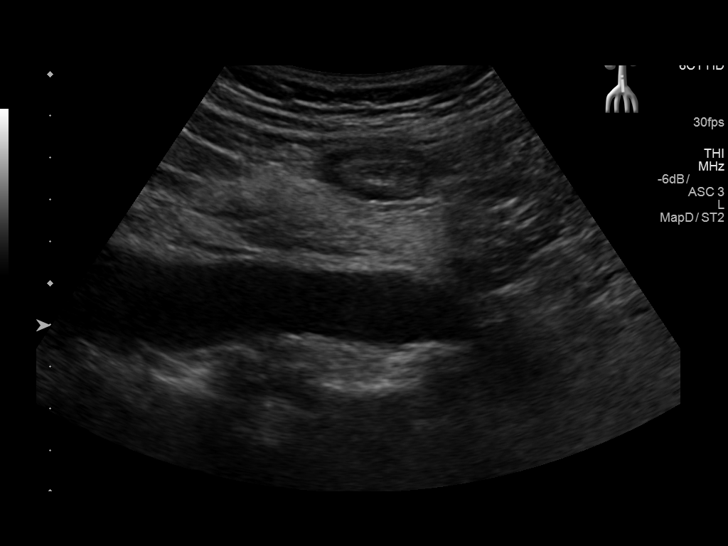

[12 of 25 positions shown; findings below may reference images not displayed]

FINDINGS: ULTRASOUND ABDOMEN

Gallbladder: No gallstones or wall thickening visualized. No
sonographic Murphy sign noted by sonographer.

Common bile duct: Diameter: 4 mm, within normal limits.

Liver: Mildly increased parenchymal echogenicity, consistent with
mild steatosis. A subtle hypoechoic lesion is seen in the posterior
left hepatic lobe measuring 1.8 x 0.9 by 1.3 cm. No other liver
lesions identified. Portal vein is patent on color Doppler imaging
with normal direction of blood flow towards the liver.

IVC: No abnormality visualized.

Pancreas: Visualized portion unremarkable.

Spleen: Size and appearance within normal limits.

Right Kidney: Length: 11.4 cm. Echogenicity within normal limits.
Simple appearing cyst seen in upper pole measuring 5.4 cm. No mass
or hydronephrosis visualized.

Left Kidney: Length: 10.5 cm. Echogenicity within normal limits.
Small cyst noted in lower pole measuring 1.3 cm. No mass or
hydronephrosis visualized.

Abdominal aorta: No aneurysm visualized.

Other findings: None.

ULTRASOUND HEPATIC ELASTOGRAPHY

Device: Siemens Helix VTQ

Patient position: Supine

Transducer [id number]

Number of measurements: 10

Hepatic segment:  8

Median velocity:   1.07 m/sec

IQR:

IQR/Median velocity ratio:

Corresponding Metavir fibrosis score:  F0/F1

Risk of fibrosis: Minimal

Limitations of exam: None

Please note that abnormal shear wave velocities may also be
identified in clinical settings other than with hepatic fibrosis,
such as: acute hepatitis, elevated right heart and central venous
pressures including use of beta blockers, Raudel disease
(Amazigh), infiltrative processes such as
mastocytosis/amyloidosis/infiltrative tumor, extrahepatic
cholestasis, in the post-prandial state, and liver transplantation.
Correlation with patient history, laboratory data, and clinical
condition recommended.
IMPRESSION: ULTRASOUND ABDOMEN:

No evidence of gallstones or biliary ductal dilatation.

Mild hepatic steatosis. Subtle 1.8 cm hypoechoic lesion in left
hepatic lobe may be due to focal fatty sparing, although neoplasm
cannot be excluded. Recommend abdomen MRI without and with contrast
for further characterization.

ULTRASOUND HEPATIC ELASTOGRAPHY:

Median hepatic shear wave velocity is calculated at 1.07 m/sec.

Corresponding Metavir fibrosis score is F0/F1.

Risk of fibrosis is Minimal.

Follow-up: None required

## 2019-04-13 ENCOUNTER — Ambulatory Visit: Admission: RE | Admit: 2019-04-13 | Payer: PPO | Source: Ambulatory Visit

## 2019-09-17 DIAGNOSIS — R739 Hyperglycemia, unspecified: Secondary | ICD-10-CM | POA: Diagnosis not present

## 2019-09-17 DIAGNOSIS — I700 Atherosclerosis of aorta: Secondary | ICD-10-CM | POA: Diagnosis not present

## 2019-09-17 DIAGNOSIS — E785 Hyperlipidemia, unspecified: Secondary | ICD-10-CM | POA: Diagnosis not present

## 2019-09-17 DIAGNOSIS — K7469 Other cirrhosis of liver: Secondary | ICD-10-CM | POA: Diagnosis not present

## 2019-09-17 DIAGNOSIS — I739 Peripheral vascular disease, unspecified: Secondary | ICD-10-CM | POA: Diagnosis not present

## 2019-09-24 ENCOUNTER — Other Ambulatory Visit: Payer: Self-pay | Admitting: Internal Medicine

## 2019-09-24 DIAGNOSIS — R739 Hyperglycemia, unspecified: Secondary | ICD-10-CM | POA: Diagnosis not present

## 2019-09-24 DIAGNOSIS — K7469 Other cirrhosis of liver: Secondary | ICD-10-CM

## 2019-09-24 DIAGNOSIS — D3500 Benign neoplasm of unspecified adrenal gland: Secondary | ICD-10-CM

## 2019-09-24 DIAGNOSIS — J449 Chronic obstructive pulmonary disease, unspecified: Secondary | ICD-10-CM | POA: Diagnosis not present

## 2019-09-24 DIAGNOSIS — I739 Peripheral vascular disease, unspecified: Secondary | ICD-10-CM | POA: Diagnosis not present

## 2019-09-24 DIAGNOSIS — I700 Atherosclerosis of aorta: Secondary | ICD-10-CM | POA: Diagnosis not present

## 2019-09-24 DIAGNOSIS — Z959 Presence of cardiac and vascular implant and graft, unspecified: Secondary | ICD-10-CM | POA: Diagnosis not present

## 2019-09-24 DIAGNOSIS — I69359 Hemiplegia and hemiparesis following cerebral infarction affecting unspecified side: Secondary | ICD-10-CM | POA: Diagnosis not present

## 2019-09-24 DIAGNOSIS — F325 Major depressive disorder, single episode, in full remission: Secondary | ICD-10-CM | POA: Diagnosis not present

## 2019-09-24 DIAGNOSIS — K860 Alcohol-induced chronic pancreatitis: Secondary | ICD-10-CM | POA: Diagnosis not present

## 2019-09-24 DIAGNOSIS — Z125 Encounter for screening for malignant neoplasm of prostate: Secondary | ICD-10-CM | POA: Diagnosis not present

## 2019-09-24 DIAGNOSIS — E785 Hyperlipidemia, unspecified: Secondary | ICD-10-CM | POA: Diagnosis not present

## 2019-09-30 ENCOUNTER — Other Ambulatory Visit: Payer: Self-pay

## 2019-09-30 ENCOUNTER — Ambulatory Visit
Admission: RE | Admit: 2019-09-30 | Discharge: 2019-09-30 | Disposition: A | Discharge status: Home / Self Care | Payer: PPO | Source: Ambulatory Visit | Attending: Internal Medicine | Admitting: Internal Medicine

## 2019-09-30 ENCOUNTER — Encounter (INDEPENDENT_AMBULATORY_CARE_PROVIDER_SITE_OTHER): Payer: Self-pay

## 2019-09-30 DIAGNOSIS — D3500 Benign neoplasm of unspecified adrenal gland: Secondary | ICD-10-CM | POA: Insufficient documentation

## 2019-09-30 DIAGNOSIS — K7469 Other cirrhosis of liver: Secondary | ICD-10-CM | POA: Diagnosis not present

## 2019-09-30 DIAGNOSIS — K7460 Unspecified cirrhosis of liver: Secondary | ICD-10-CM | POA: Diagnosis not present

## 2019-11-13 IMAGING — US US ABDOMEN COMPLETE W/ ELASTOGRAPHY
1 series · 11 of 11 positions shown · non-contrast
Comparison: None.

CLINICAL DATA: Abnormal appearance of liver on recent CT. Possible
cirrhosis.



[Series 1: us abdomen complete w/ elastography · 0.20mm/px · 11 of 11 slices shown]
[im 1/11]
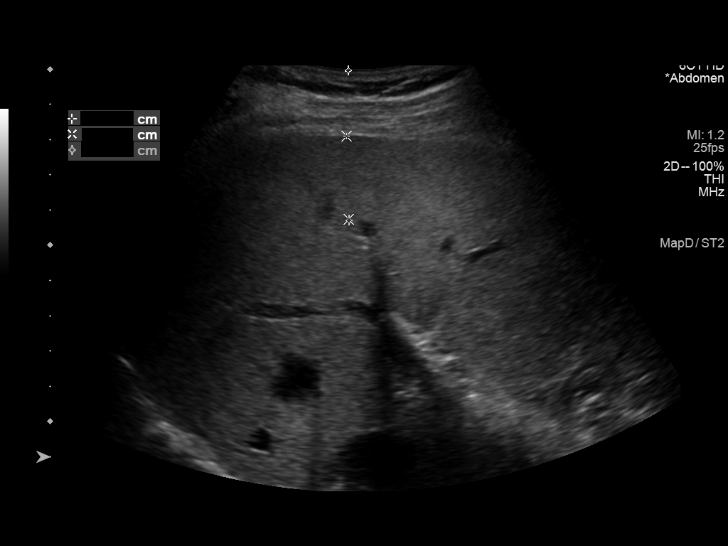
[im 2/11]
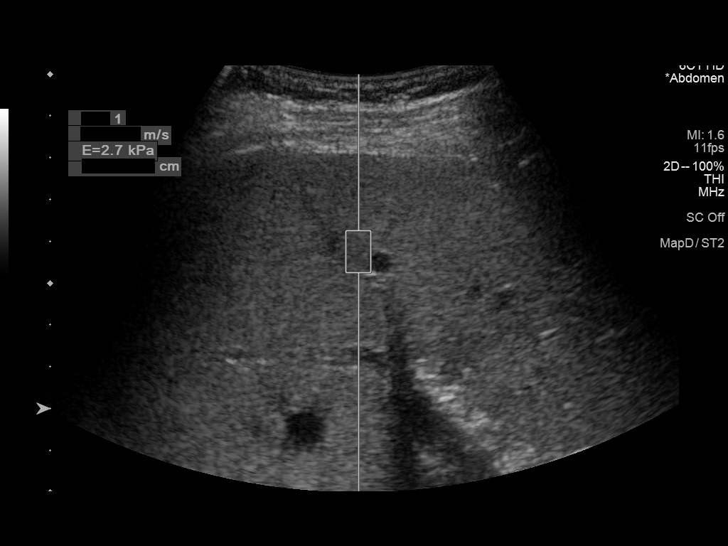
[im 3/11]
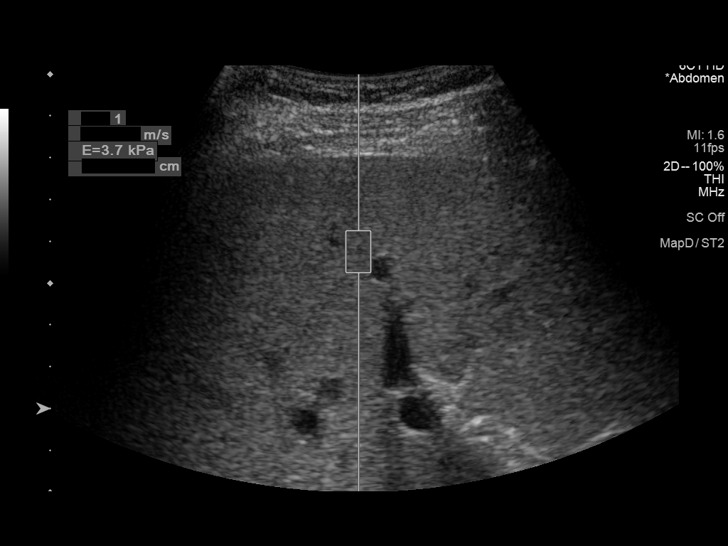
[im 4/11]
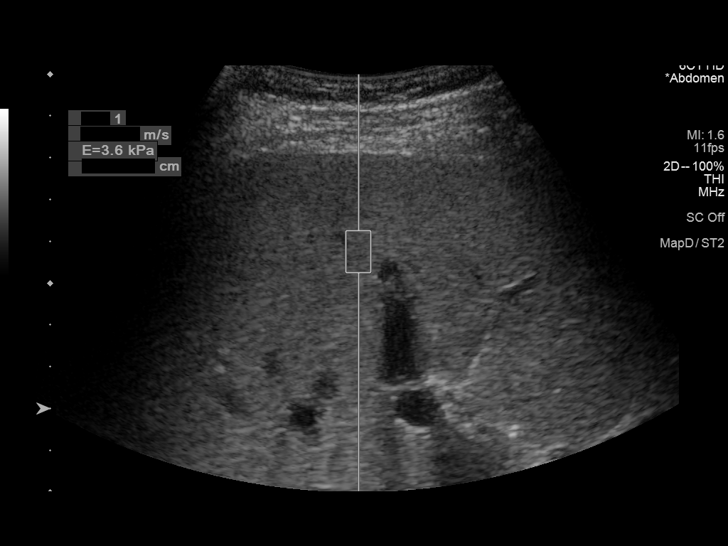
[im 5/11]
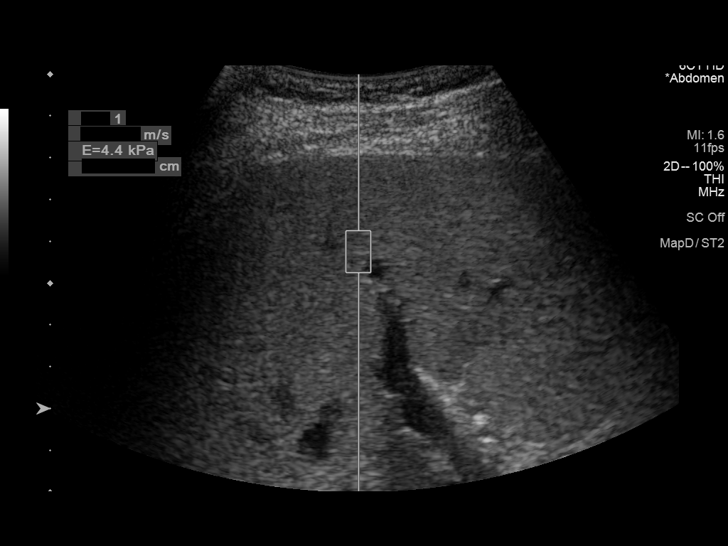
[im 6/11]
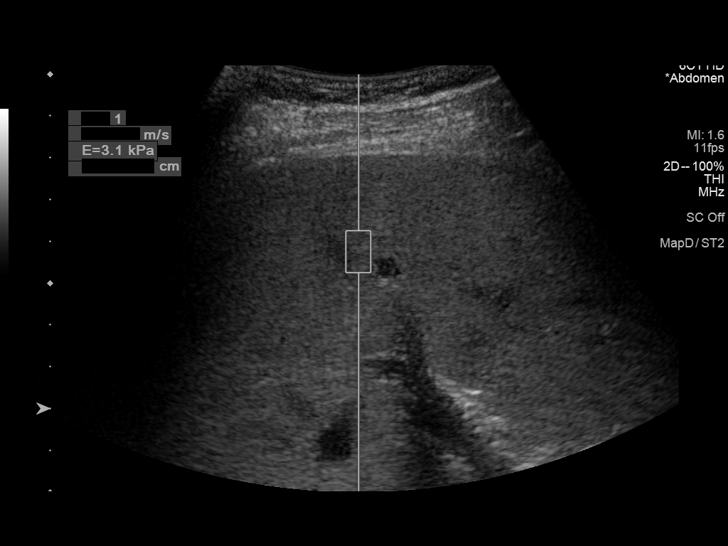
[im 7/11]
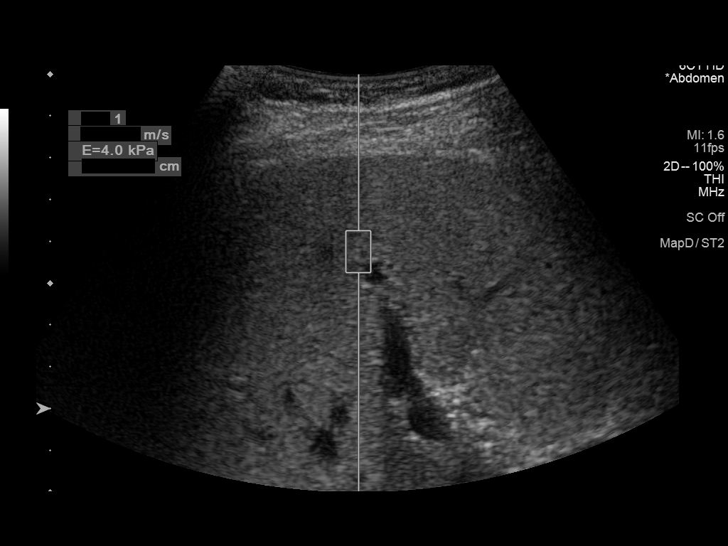
[im 8/11]
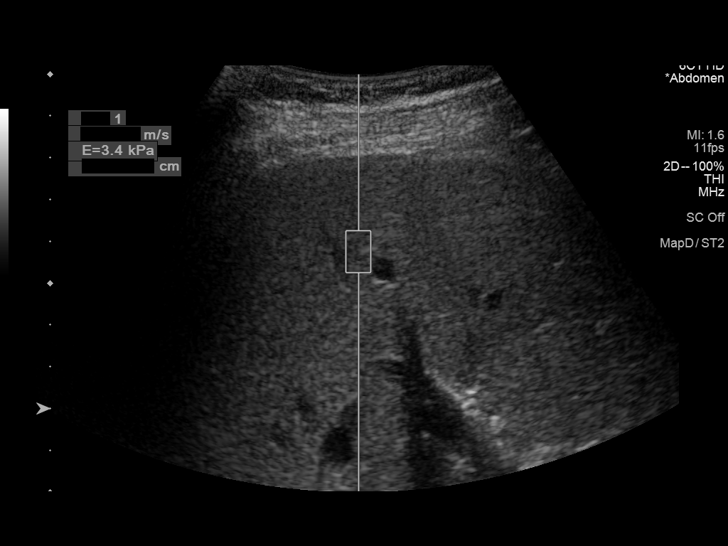
[im 9/11]
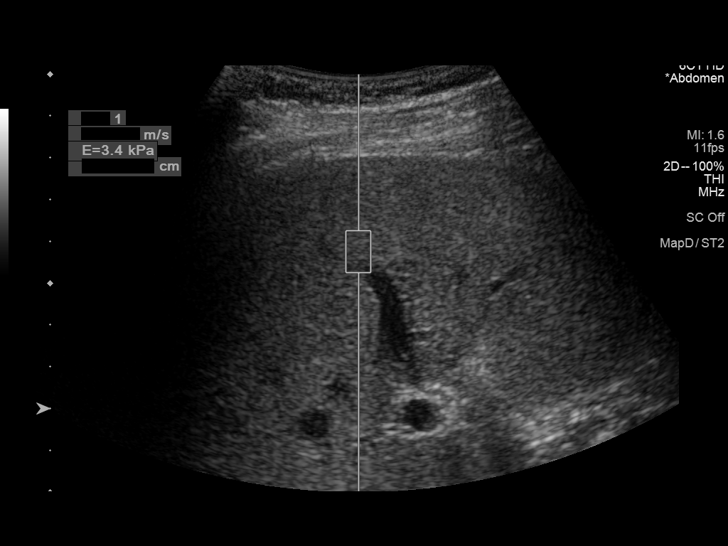
[im 10/11]
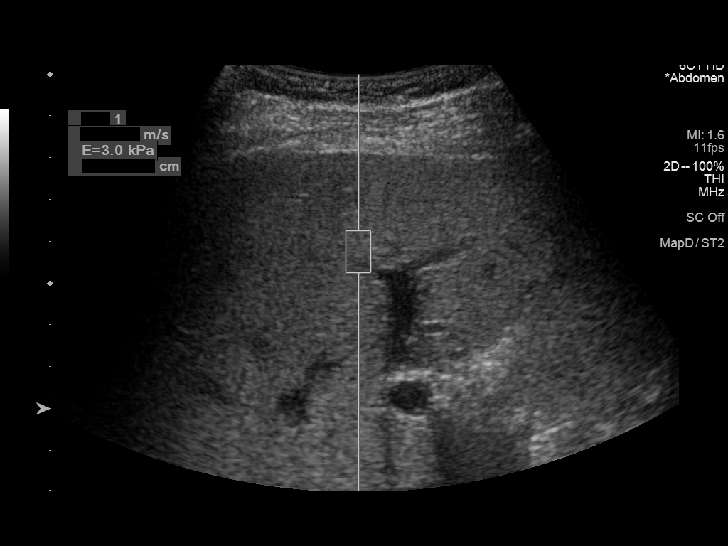
[im 11/11]
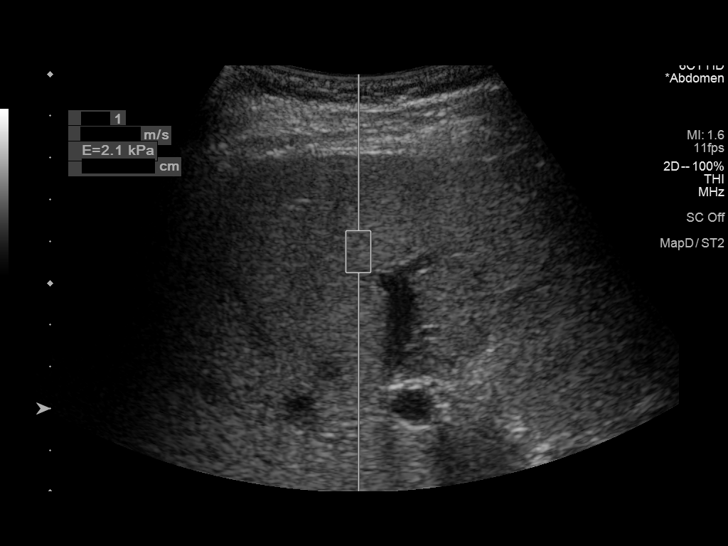

[11 of 11 positions shown; findings below may reference images not displayed]

FINDINGS: ULTRASOUND ABDOMEN

Gallbladder: No gallstones or wall thickening visualized. No
sonographic Murphy sign noted by sonographer.

Common bile duct: Diameter: 4 mm, within normal limits.

Liver: Mildly increased parenchymal echogenicity, consistent with
mild steatosis. A subtle hypoechoic lesion is seen in the posterior
left hepatic lobe measuring 1.8 x 0.9 by 1.3 cm. No other liver
lesions identified. Portal vein is patent on color Doppler imaging
with normal direction of blood flow towards the liver.

IVC: No abnormality visualized.

Pancreas: Visualized portion unremarkable.

Spleen: Size and appearance within normal limits.

Right Kidney: Length: 11.4 cm. Echogenicity within normal limits.
Simple appearing cyst seen in upper pole measuring 5.4 cm. No mass
or hydronephrosis visualized.

Left Kidney: Length: 10.5 cm. Echogenicity within normal limits.
Small cyst noted in lower pole measuring 1.3 cm. No mass or
hydronephrosis visualized.

Abdominal aorta: No aneurysm visualized.

Other findings: None.

ULTRASOUND HEPATIC ELASTOGRAPHY

Device: Siemens Helix VTQ

Patient position: Supine

Transducer 6C1

Number of measurements: 10

Hepatic segment:  8

Median velocity:   1.07 m/sec

IQR:

IQR/Median velocity ratio:

Corresponding Metavir fibrosis score:  F0/F1

Risk of fibrosis: Minimal

Limitations of exam: None

Please note that abnormal shear wave velocities may also be
identified in clinical settings other than with hepatic fibrosis,
such as: acute hepatitis, elevated right heart and central venous
pressures including use of beta blockers, Raudel disease
(Amazigh), infiltrative processes such as
mastocytosis/amyloidosis/infiltrative tumor, extrahepatic
cholestasis, in the post-prandial state, and liver transplantation.
Correlation with patient history, laboratory data, and clinical
condition recommended.
IMPRESSION: ULTRASOUND ABDOMEN:

No evidence of gallstones or biliary ductal dilatation.

Mild hepatic steatosis. Subtle 1.8 cm hypoechoic lesion in left
hepatic lobe may be due to focal fatty sparing, although neoplasm
cannot be excluded. Recommend abdomen MRI without and with contrast
for further characterization.

ULTRASOUND HEPATIC ELASTOGRAPHY:

Median hepatic shear wave velocity is calculated at 1.07 m/sec.

Corresponding Metavir fibrosis score is F0/F1.

Risk of fibrosis is Minimal.

Follow-up: None required

## 2019-11-23 DIAGNOSIS — K760 Fatty (change of) liver, not elsewhere classified: Secondary | ICD-10-CM | POA: Diagnosis not present

## 2019-11-23 DIAGNOSIS — K219 Gastro-esophageal reflux disease without esophagitis: Secondary | ICD-10-CM | POA: Diagnosis not present

## 2019-11-23 DIAGNOSIS — K860 Alcohol-induced chronic pancreatitis: Secondary | ICD-10-CM | POA: Diagnosis not present

## 2020-03-20 DIAGNOSIS — I739 Peripheral vascular disease, unspecified: Secondary | ICD-10-CM | POA: Diagnosis not present

## 2020-03-20 DIAGNOSIS — Z125 Encounter for screening for malignant neoplasm of prostate: Secondary | ICD-10-CM | POA: Diagnosis not present

## 2020-03-20 DIAGNOSIS — R739 Hyperglycemia, unspecified: Secondary | ICD-10-CM | POA: Diagnosis not present

## 2020-03-20 DIAGNOSIS — I700 Atherosclerosis of aorta: Secondary | ICD-10-CM | POA: Diagnosis not present

## 2020-03-20 DIAGNOSIS — J449 Chronic obstructive pulmonary disease, unspecified: Secondary | ICD-10-CM | POA: Diagnosis not present

## 2020-03-20 DIAGNOSIS — E785 Hyperlipidemia, unspecified: Secondary | ICD-10-CM | POA: Diagnosis not present

## 2020-03-27 DIAGNOSIS — Z0000 Encounter for general adult medical examination without abnormal findings: Secondary | ICD-10-CM | POA: Diagnosis not present

## 2020-03-27 DIAGNOSIS — J449 Chronic obstructive pulmonary disease, unspecified: Secondary | ICD-10-CM | POA: Diagnosis not present

## 2020-03-27 DIAGNOSIS — F325 Major depressive disorder, single episode, in full remission: Secondary | ICD-10-CM | POA: Diagnosis not present

## 2020-03-27 DIAGNOSIS — I69359 Hemiplegia and hemiparesis following cerebral infarction affecting unspecified side: Secondary | ICD-10-CM | POA: Diagnosis not present

## 2020-03-27 DIAGNOSIS — E785 Hyperlipidemia, unspecified: Secondary | ICD-10-CM | POA: Diagnosis not present

## 2020-03-27 DIAGNOSIS — R739 Hyperglycemia, unspecified: Secondary | ICD-10-CM | POA: Diagnosis not present

## 2020-03-27 DIAGNOSIS — K219 Gastro-esophageal reflux disease without esophagitis: Secondary | ICD-10-CM | POA: Diagnosis not present

## 2020-03-27 DIAGNOSIS — I700 Atherosclerosis of aorta: Secondary | ICD-10-CM | POA: Diagnosis not present

## 2020-03-27 DIAGNOSIS — Z8673 Personal history of transient ischemic attack (TIA), and cerebral infarction without residual deficits: Secondary | ICD-10-CM | POA: Diagnosis not present

## 2020-03-27 DIAGNOSIS — E278 Other specified disorders of adrenal gland: Secondary | ICD-10-CM | POA: Diagnosis not present

## 2020-03-27 DIAGNOSIS — K860 Alcohol-induced chronic pancreatitis: Secondary | ICD-10-CM | POA: Diagnosis not present

## 2020-03-27 DIAGNOSIS — I739 Peripheral vascular disease, unspecified: Secondary | ICD-10-CM | POA: Diagnosis not present

## 2020-09-18 DIAGNOSIS — R739 Hyperglycemia, unspecified: Secondary | ICD-10-CM | POA: Diagnosis not present

## 2020-09-18 DIAGNOSIS — K219 Gastro-esophageal reflux disease without esophagitis: Secondary | ICD-10-CM | POA: Diagnosis not present

## 2020-09-18 DIAGNOSIS — I700 Atherosclerosis of aorta: Secondary | ICD-10-CM | POA: Diagnosis not present

## 2020-09-18 DIAGNOSIS — I69359 Hemiplegia and hemiparesis following cerebral infarction affecting unspecified side: Secondary | ICD-10-CM | POA: Diagnosis not present

## 2020-09-18 DIAGNOSIS — J449 Chronic obstructive pulmonary disease, unspecified: Secondary | ICD-10-CM | POA: Diagnosis not present

## 2020-09-18 DIAGNOSIS — E785 Hyperlipidemia, unspecified: Secondary | ICD-10-CM | POA: Diagnosis not present

## 2020-09-25 DIAGNOSIS — J449 Chronic obstructive pulmonary disease, unspecified: Secondary | ICD-10-CM | POA: Diagnosis not present

## 2020-09-25 DIAGNOSIS — I69359 Hemiplegia and hemiparesis following cerebral infarction affecting unspecified side: Secondary | ICD-10-CM | POA: Diagnosis not present

## 2020-09-25 DIAGNOSIS — I700 Atherosclerosis of aorta: Secondary | ICD-10-CM | POA: Diagnosis not present

## 2020-09-25 DIAGNOSIS — E785 Hyperlipidemia, unspecified: Secondary | ICD-10-CM | POA: Diagnosis not present

## 2020-09-25 DIAGNOSIS — Z125 Encounter for screening for malignant neoplasm of prostate: Secondary | ICD-10-CM | POA: Diagnosis not present

## 2020-09-25 DIAGNOSIS — I739 Peripheral vascular disease, unspecified: Secondary | ICD-10-CM | POA: Diagnosis not present

## 2020-09-25 DIAGNOSIS — K860 Alcohol-induced chronic pancreatitis: Secondary | ICD-10-CM | POA: Diagnosis not present

## 2020-09-25 DIAGNOSIS — R739 Hyperglycemia, unspecified: Secondary | ICD-10-CM | POA: Diagnosis not present

## 2020-09-25 DIAGNOSIS — E278 Other specified disorders of adrenal gland: Secondary | ICD-10-CM | POA: Diagnosis not present

## 2020-09-25 DIAGNOSIS — F325 Major depressive disorder, single episode, in full remission: Secondary | ICD-10-CM | POA: Diagnosis not present

## 2021-03-13 DIAGNOSIS — Z6822 Body mass index (BMI) 22.0-22.9, adult: Secondary | ICD-10-CM | POA: Diagnosis not present

## 2021-03-13 DIAGNOSIS — Z7982 Long term (current) use of aspirin: Secondary | ICD-10-CM | POA: Diagnosis not present

## 2021-03-13 DIAGNOSIS — K219 Gastro-esophageal reflux disease without esophagitis: Secondary | ICD-10-CM | POA: Diagnosis not present

## 2021-03-13 DIAGNOSIS — E785 Hyperlipidemia, unspecified: Secondary | ICD-10-CM | POA: Diagnosis not present

## 2021-03-13 DIAGNOSIS — I739 Peripheral vascular disease, unspecified: Secondary | ICD-10-CM | POA: Diagnosis not present

## 2021-03-13 DIAGNOSIS — Z8673 Personal history of transient ischemic attack (TIA), and cerebral infarction without residual deficits: Secondary | ICD-10-CM | POA: Diagnosis not present

## 2021-04-24 DIAGNOSIS — I69351 Hemiplegia and hemiparesis following cerebral infarction affecting right dominant side: Secondary | ICD-10-CM | POA: Diagnosis not present

## 2021-04-24 DIAGNOSIS — Z87891 Personal history of nicotine dependence: Secondary | ICD-10-CM | POA: Diagnosis not present

## 2021-04-24 DIAGNOSIS — J449 Chronic obstructive pulmonary disease, unspecified: Secondary | ICD-10-CM | POA: Diagnosis not present

## 2021-04-24 DIAGNOSIS — I739 Peripheral vascular disease, unspecified: Secondary | ICD-10-CM | POA: Diagnosis not present

## 2021-05-01 DIAGNOSIS — I700 Atherosclerosis of aorta: Secondary | ICD-10-CM | POA: Diagnosis not present

## 2021-05-01 DIAGNOSIS — E785 Hyperlipidemia, unspecified: Secondary | ICD-10-CM | POA: Diagnosis not present

## 2021-05-01 DIAGNOSIS — R739 Hyperglycemia, unspecified: Secondary | ICD-10-CM | POA: Diagnosis not present

## 2021-05-01 DIAGNOSIS — I739 Peripheral vascular disease, unspecified: Secondary | ICD-10-CM | POA: Diagnosis not present

## 2021-05-01 DIAGNOSIS — Z125 Encounter for screening for malignant neoplasm of prostate: Secondary | ICD-10-CM | POA: Diagnosis not present

## 2021-05-08 DIAGNOSIS — E785 Hyperlipidemia, unspecified: Secondary | ICD-10-CM | POA: Diagnosis not present

## 2021-05-08 DIAGNOSIS — I700 Atherosclerosis of aorta: Secondary | ICD-10-CM | POA: Diagnosis not present

## 2021-05-08 DIAGNOSIS — I739 Peripheral vascular disease, unspecified: Secondary | ICD-10-CM | POA: Diagnosis not present

## 2021-05-08 DIAGNOSIS — R739 Hyperglycemia, unspecified: Secondary | ICD-10-CM | POA: Diagnosis not present

## 2021-05-08 DIAGNOSIS — Z0000 Encounter for general adult medical examination without abnormal findings: Secondary | ICD-10-CM | POA: Diagnosis not present

## 2021-05-08 DIAGNOSIS — F325 Major depressive disorder, single episode, in full remission: Secondary | ICD-10-CM | POA: Diagnosis not present

## 2021-05-08 DIAGNOSIS — K860 Alcohol-induced chronic pancreatitis: Secondary | ICD-10-CM | POA: Diagnosis not present

## 2021-05-08 DIAGNOSIS — I69359 Hemiplegia and hemiparesis following cerebral infarction affecting unspecified side: Secondary | ICD-10-CM | POA: Diagnosis not present

## 2021-05-08 DIAGNOSIS — E278 Other specified disorders of adrenal gland: Secondary | ICD-10-CM | POA: Diagnosis not present

## 2021-05-08 DIAGNOSIS — K219 Gastro-esophageal reflux disease without esophagitis: Secondary | ICD-10-CM | POA: Diagnosis not present

## 2021-05-08 DIAGNOSIS — J449 Chronic obstructive pulmonary disease, unspecified: Secondary | ICD-10-CM | POA: Diagnosis not present

## 2021-07-26 DIAGNOSIS — E785 Hyperlipidemia, unspecified: Secondary | ICD-10-CM | POA: Diagnosis not present

## 2021-07-26 DIAGNOSIS — I69351 Hemiplegia and hemiparesis following cerebral infarction affecting right dominant side: Secondary | ICD-10-CM | POA: Diagnosis not present

## 2021-07-26 DIAGNOSIS — I69328 Other speech and language deficits following cerebral infarction: Secondary | ICD-10-CM | POA: Diagnosis not present

## 2021-10-29 DIAGNOSIS — I700 Atherosclerosis of aorta: Secondary | ICD-10-CM | POA: Diagnosis not present

## 2021-10-29 DIAGNOSIS — K219 Gastro-esophageal reflux disease without esophagitis: Secondary | ICD-10-CM | POA: Diagnosis not present

## 2021-10-29 DIAGNOSIS — I69359 Hemiplegia and hemiparesis following cerebral infarction affecting unspecified side: Secondary | ICD-10-CM | POA: Diagnosis not present

## 2021-10-29 DIAGNOSIS — E785 Hyperlipidemia, unspecified: Secondary | ICD-10-CM | POA: Diagnosis not present

## 2021-10-29 DIAGNOSIS — J449 Chronic obstructive pulmonary disease, unspecified: Secondary | ICD-10-CM | POA: Diagnosis not present

## 2021-10-29 DIAGNOSIS — R739 Hyperglycemia, unspecified: Secondary | ICD-10-CM | POA: Diagnosis not present

## 2021-11-02 IMAGING — US US ABDOMEN COMPLETE
1 series · 13 of 25 positions shown · non-contrast
Comparison: 10/10/2017

CLINICAL DATA: Cirrhosis

EXAM:
ABDOMEN ULTRASOUND COMPLETE

[Series 1: us abdomen complete · 0.17mm/px · 13 of 106 slices shown]
[im 1/106]
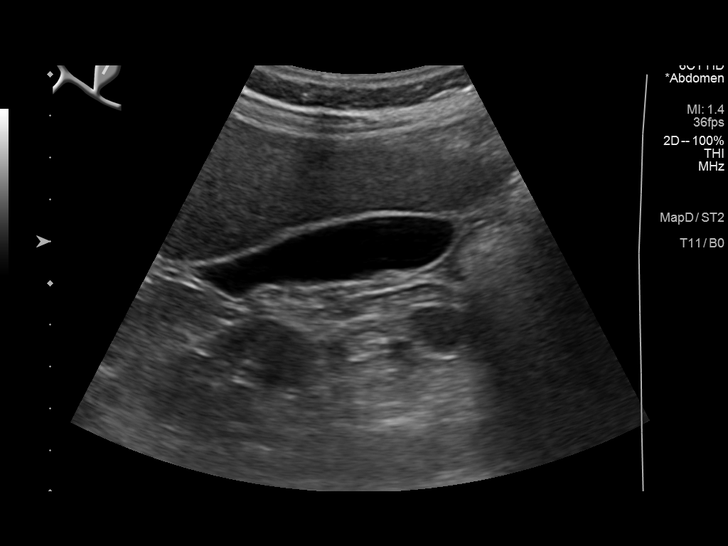
[im 9/106]
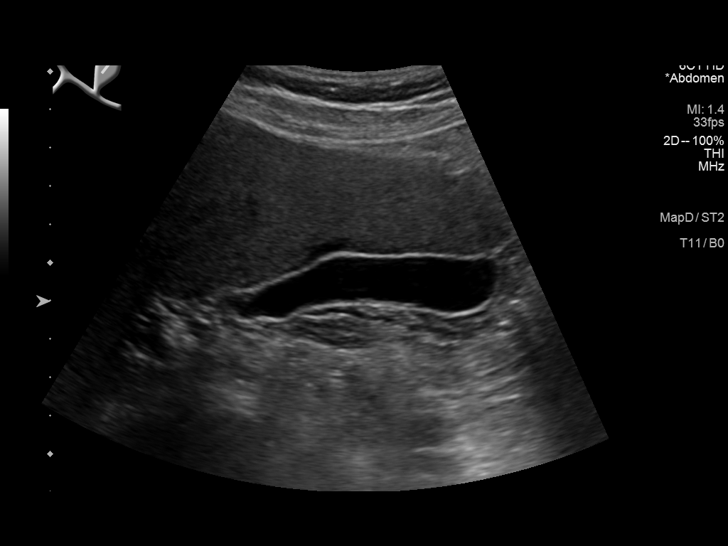
[im 18/106]
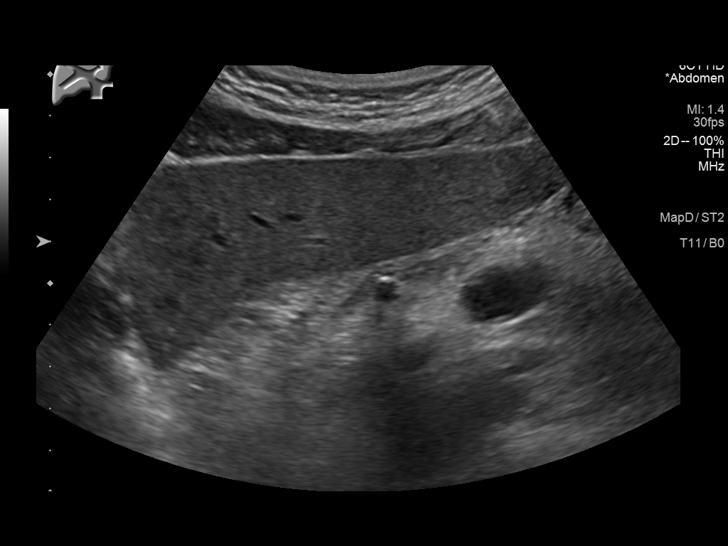
[im 27/106]
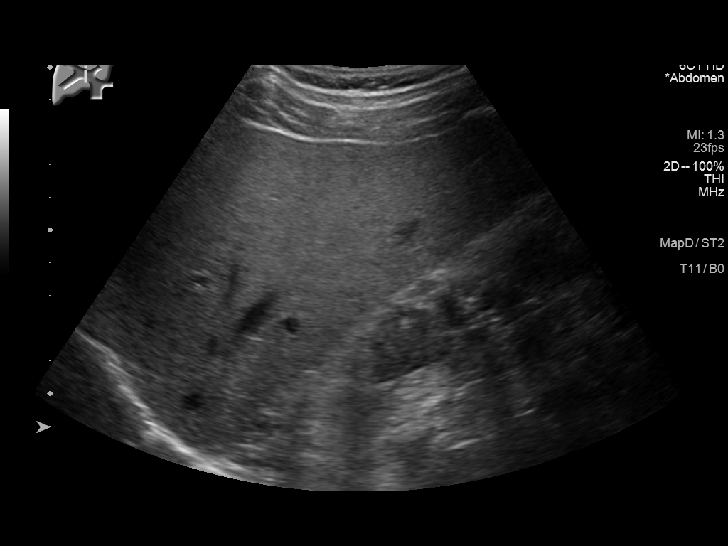
[im 36/106]
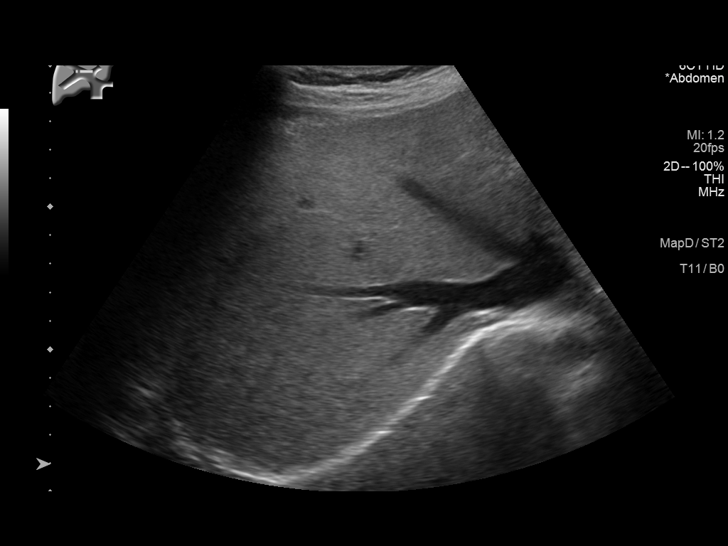
[im 44/106]
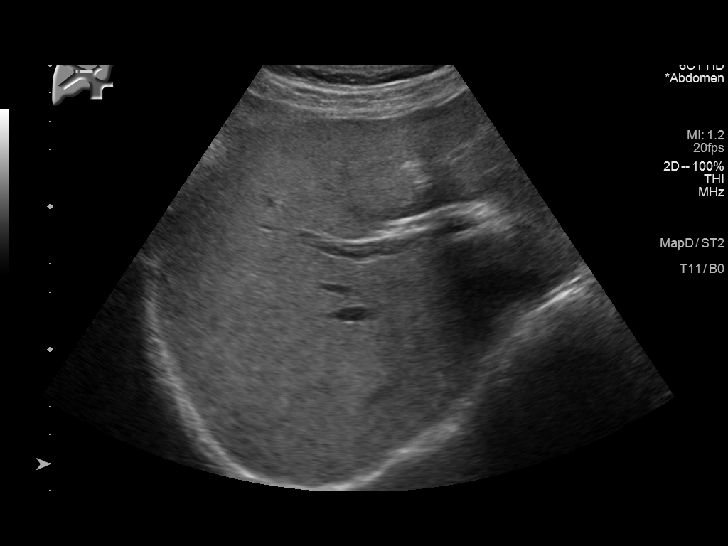
[im 53/106]
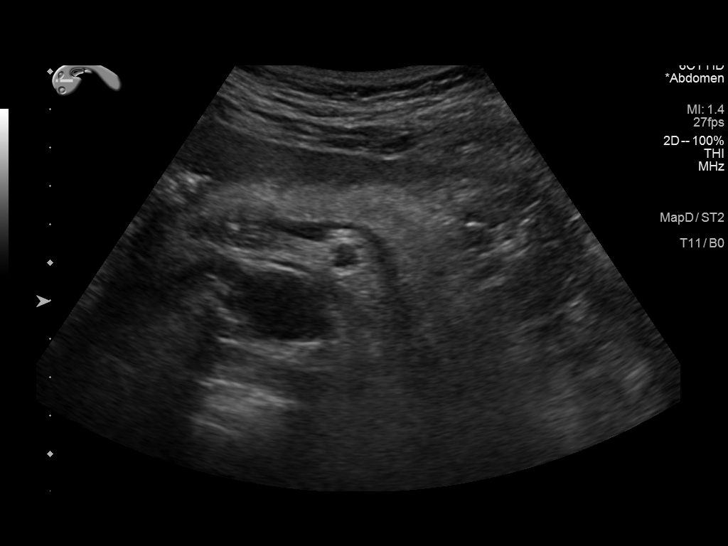
[im 62/106]
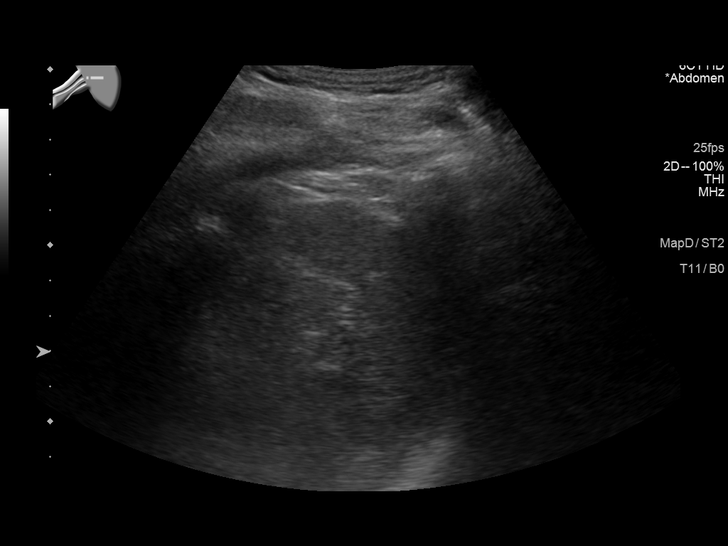
[im 71/106]
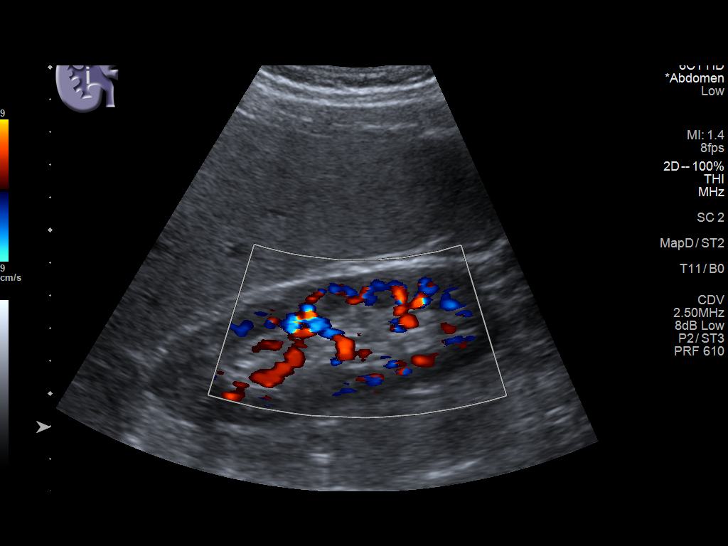
[im 79/106]
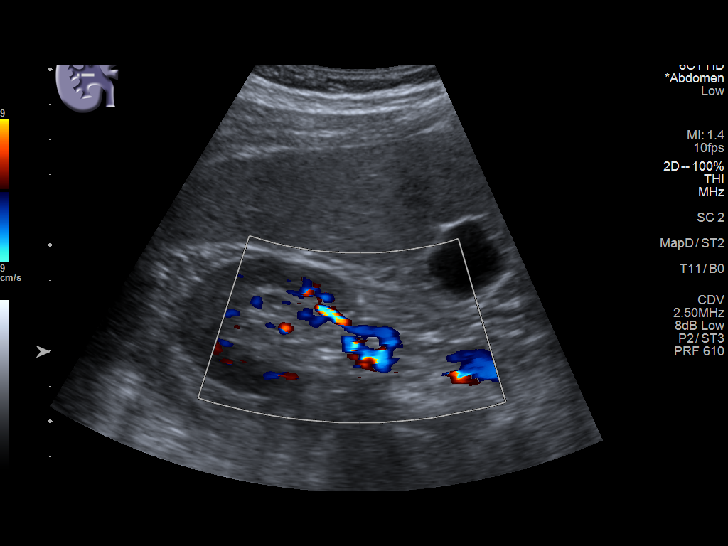
[im 88/106]
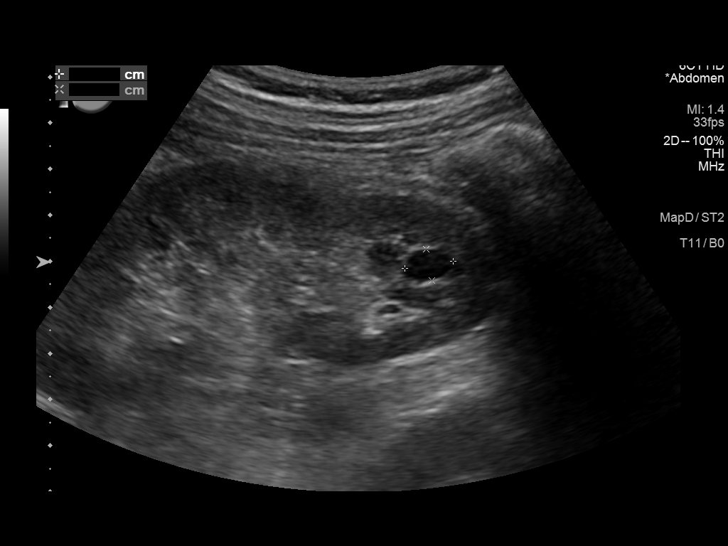
[im 97/106]
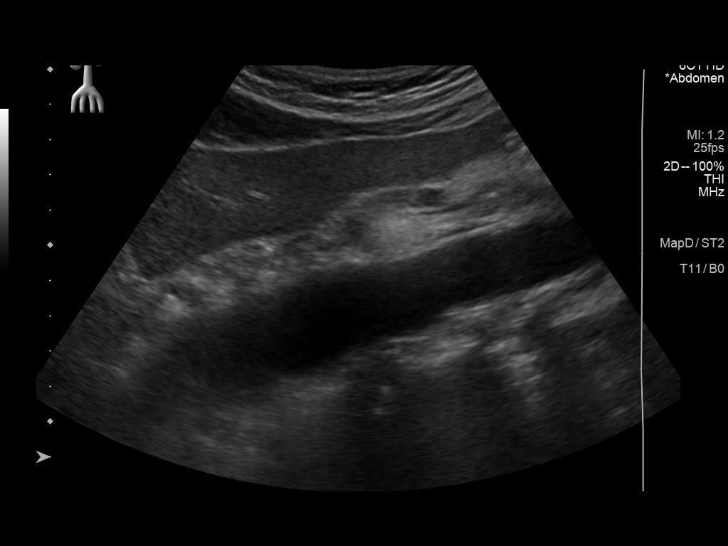
[im 106/106]
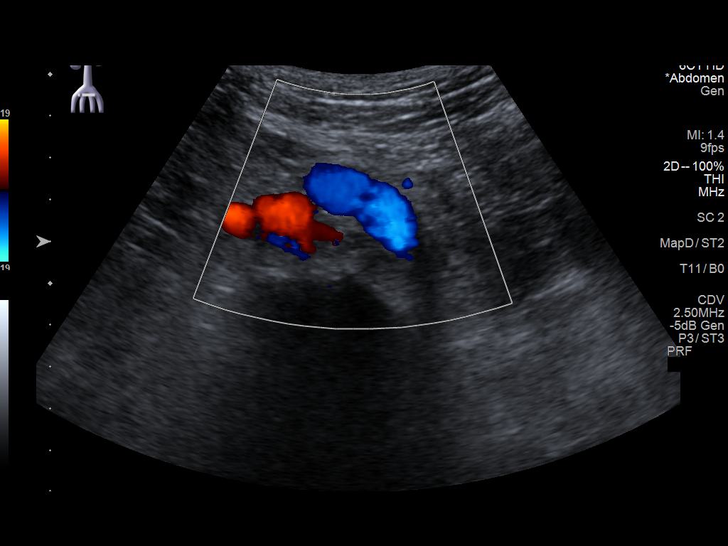

[13 of 25 positions shown; findings below may reference images not displayed]

FINDINGS: Gallbladder: No gallstones or wall thickening visualized. No
sonographic Murphy sign noted by sonographer.

Common bile duct: Diameter: 2 mm.

Liver: No focal lesion identified. Mildly increased hepatic
parenchymal echogenicity. Portal vein is patent on color Doppler
imaging with normal direction of blood flow towards the liver.

IVC: No abnormality visualized.

Pancreas: Visualized portion unremarkable.

Spleen: Size and appearance within normal limits.

Right Kidney: Length: 11.5 cm. Echogenicity within normal limits.
Superior pole right renal cyst measuring up to 5.1 cm. No solid
mass, shadowing stone, or hydronephrosis visualized.

Left Kidney: Length: 11.2 cm. Echogenicity within normal limits.
Lower pole cyst measuring 1.1 cm. No solid mass, shadowing stone, or
hydronephrosis visualized.

Abdominal aorta: 2.8 cm in diameter proximally.

Other findings: Solid right suprarenal mass measuring 2.7 x 2.1 x
2.5 cm compatible with known adenoma. Known left adrenal adenoma was
not well visualized sonographically.
IMPRESSION: 1. The echogenicity of the liver is increased. This is a nonspecific
finding but is most commonly seen with fatty infiltration of the
liver. There are no obvious focal liver lesions.
2. Ectatic abdominal aorta at risk for aneurysm development.
Recommend followup by ultrasound in 5 years. This recommendation
follows ACR consensus guidelines: White Paper of the ACR Incidental
[DATE]. Aortic aneurysm NOS ([id number])

## 2021-11-05 DIAGNOSIS — I739 Peripheral vascular disease, unspecified: Secondary | ICD-10-CM | POA: Diagnosis not present

## 2021-11-05 DIAGNOSIS — F17210 Nicotine dependence, cigarettes, uncomplicated: Secondary | ICD-10-CM | POA: Diagnosis not present

## 2021-11-05 DIAGNOSIS — F325 Major depressive disorder, single episode, in full remission: Secondary | ICD-10-CM | POA: Diagnosis not present

## 2021-11-05 DIAGNOSIS — J449 Chronic obstructive pulmonary disease, unspecified: Secondary | ICD-10-CM | POA: Diagnosis not present

## 2021-11-05 DIAGNOSIS — I69359 Hemiplegia and hemiparesis following cerebral infarction affecting unspecified side: Secondary | ICD-10-CM | POA: Diagnosis not present

## 2021-11-05 DIAGNOSIS — E785 Hyperlipidemia, unspecified: Secondary | ICD-10-CM | POA: Diagnosis not present

## 2021-11-05 DIAGNOSIS — R7303 Prediabetes: Secondary | ICD-10-CM | POA: Diagnosis not present

## 2021-11-05 DIAGNOSIS — I700 Atherosclerosis of aorta: Secondary | ICD-10-CM | POA: Diagnosis not present

## 2021-11-05 DIAGNOSIS — K860 Alcohol-induced chronic pancreatitis: Secondary | ICD-10-CM | POA: Diagnosis not present

## 2021-11-05 DIAGNOSIS — E278 Other specified disorders of adrenal gland: Secondary | ICD-10-CM | POA: Diagnosis not present

## 2021-11-15 ENCOUNTER — Other Ambulatory Visit: Payer: Self-pay | Admitting: Internal Medicine

## 2021-11-15 DIAGNOSIS — F17210 Nicotine dependence, cigarettes, uncomplicated: Secondary | ICD-10-CM

## 2021-11-15 DIAGNOSIS — R7303 Prediabetes: Secondary | ICD-10-CM

## 2022-01-31 DIAGNOSIS — Z23 Encounter for immunization: Secondary | ICD-10-CM | POA: Diagnosis not present

## 2022-04-08 DIAGNOSIS — C3490 Malignant neoplasm of unspecified part of unspecified bronchus or lung: Secondary | ICD-10-CM

## 2022-04-08 HISTORY — DX: Malignant neoplasm of unspecified part of unspecified bronchus or lung: C34.90

## 2022-06-03 DIAGNOSIS — I739 Peripheral vascular disease, unspecified: Secondary | ICD-10-CM | POA: Diagnosis not present

## 2022-06-03 DIAGNOSIS — R7303 Prediabetes: Secondary | ICD-10-CM | POA: Diagnosis not present

## 2022-06-03 DIAGNOSIS — K860 Alcohol-induced chronic pancreatitis: Secondary | ICD-10-CM | POA: Diagnosis not present

## 2022-06-03 DIAGNOSIS — E785 Hyperlipidemia, unspecified: Secondary | ICD-10-CM | POA: Diagnosis not present

## 2022-06-10 DIAGNOSIS — E278 Other specified disorders of adrenal gland: Secondary | ICD-10-CM | POA: Diagnosis not present

## 2022-06-10 DIAGNOSIS — I700 Atherosclerosis of aorta: Secondary | ICD-10-CM | POA: Diagnosis not present

## 2022-06-10 DIAGNOSIS — K219 Gastro-esophageal reflux disease without esophagitis: Secondary | ICD-10-CM | POA: Diagnosis not present

## 2022-06-10 DIAGNOSIS — I739 Peripheral vascular disease, unspecified: Secondary | ICD-10-CM | POA: Diagnosis not present

## 2022-06-10 DIAGNOSIS — J449 Chronic obstructive pulmonary disease, unspecified: Secondary | ICD-10-CM | POA: Diagnosis not present

## 2022-06-10 DIAGNOSIS — Z0000 Encounter for general adult medical examination without abnormal findings: Secondary | ICD-10-CM | POA: Diagnosis not present

## 2022-06-10 DIAGNOSIS — F17210 Nicotine dependence, cigarettes, uncomplicated: Secondary | ICD-10-CM | POA: Diagnosis not present

## 2022-06-10 DIAGNOSIS — K760 Fatty (change of) liver, not elsewhere classified: Secondary | ICD-10-CM | POA: Diagnosis not present

## 2022-06-10 DIAGNOSIS — F325 Major depressive disorder, single episode, in full remission: Secondary | ICD-10-CM | POA: Diagnosis not present

## 2022-06-10 DIAGNOSIS — I69359 Hemiplegia and hemiparesis following cerebral infarction affecting unspecified side: Secondary | ICD-10-CM | POA: Diagnosis not present

## 2022-06-10 DIAGNOSIS — E785 Hyperlipidemia, unspecified: Secondary | ICD-10-CM | POA: Diagnosis not present

## 2022-06-10 DIAGNOSIS — K860 Alcohol-induced chronic pancreatitis: Secondary | ICD-10-CM | POA: Diagnosis not present

## 2022-06-10 DIAGNOSIS — R7303 Prediabetes: Secondary | ICD-10-CM | POA: Diagnosis not present

## 2022-06-12 ENCOUNTER — Other Ambulatory Visit: Payer: Self-pay | Admitting: Internal Medicine

## 2022-06-12 DIAGNOSIS — F17210 Nicotine dependence, cigarettes, uncomplicated: Secondary | ICD-10-CM

## 2022-06-12 DIAGNOSIS — R7303 Prediabetes: Secondary | ICD-10-CM

## 2022-07-05 ENCOUNTER — Ambulatory Visit
Admission: RE | Admit: 2022-07-05 | Discharge: 2022-07-05 | Disposition: A | Discharge status: Home / Self Care | Payer: PPO | Source: Ambulatory Visit | Attending: Internal Medicine | Admitting: Internal Medicine

## 2022-07-05 DIAGNOSIS — R7303 Prediabetes: Secondary | ICD-10-CM | POA: Diagnosis not present

## 2022-07-05 DIAGNOSIS — F17210 Nicotine dependence, cigarettes, uncomplicated: Secondary | ICD-10-CM | POA: Diagnosis not present

## 2022-07-05 DIAGNOSIS — Z87891 Personal history of nicotine dependence: Secondary | ICD-10-CM | POA: Diagnosis not present

## 2022-07-31 ENCOUNTER — Telehealth: Payer: Self-pay | Admitting: *Deleted

## 2022-07-31 NOTE — Telephone Encounter (Signed)
Dr Delton Coombes received the report on lung cancer screening CT that was done on 07/05/22 and read as a Lung Rads 4B. This CT was ordered by Dr Daniel Nones.  Called De Klein's office and left a message for nurse to call back to make sure pt will be scheduled for follow up for this abnormal CT Scan.

## 2022-08-02 NOTE — Telephone Encounter (Signed)
Spoke with Jeremy Harrell at Dr Odessa Fleming office. She verified that pt had been notified of CT results. She is going to send a message to Dr Graciela Husbands to determine what the follow up for the lung nodules will be. Will await call back.

## 2022-08-05 NOTE — Telephone Encounter (Signed)
Received a voicemail from [employer]. At Dr Deland Pretty office stating that patient has been referred to Dr Karna Christmas to follow up on lung nodules seen on lung screening CT. Pt is aware of this referral.

## 2022-09-13 DIAGNOSIS — R911 Solitary pulmonary nodule: Secondary | ICD-10-CM | POA: Diagnosis not present

## 2022-09-13 DIAGNOSIS — R0602 Shortness of breath: Secondary | ICD-10-CM | POA: Diagnosis not present

## 2022-09-13 DIAGNOSIS — J449 Chronic obstructive pulmonary disease, unspecified: Secondary | ICD-10-CM | POA: Diagnosis not present

## 2022-09-13 LAB — PULMONARY FUNCTION TEST

## 2022-09-27 ENCOUNTER — Encounter
Admission: RE | Admit: 2022-09-27 | Discharge: 2022-09-27 | Disposition: A | Discharge status: Home / Self Care | Payer: PPO | Source: Ambulatory Visit | Attending: Pulmonary Disease | Admitting: Pulmonary Disease

## 2022-09-27 ENCOUNTER — Other Ambulatory Visit: Payer: Self-pay

## 2022-09-27 DIAGNOSIS — Z01812 Encounter for preprocedural laboratory examination: Secondary | ICD-10-CM

## 2022-09-27 DIAGNOSIS — Z87891 Personal history of nicotine dependence: Secondary | ICD-10-CM

## 2022-09-27 HISTORY — DX: Pneumonia, unspecified organism: J18.9

## 2022-09-27 HISTORY — DX: Malignant (primary) neoplasm, unspecified: C80.1

## 2022-09-27 NOTE — Patient Instructions (Addendum)
Your procedure is scheduled on: 10/07/22 - Monday Report to the Registration Desk on the 1st floor of the [facility] at 11:30 am.  REMEMBER: Instructions that are not followed completely may result in serious medical risk, up to and including death; or upon the discretion of your surgeon and anesthesiologist your surgery may need to be rescheduled.  Do not eat food or drink any liquids after midnight the night before surgery.  No gum chewing or hard candies.  One week prior to surgery: Stop Anti-inflammatories (NSAIDS) such as Advil, Aleve, Ibuprofen, Motrin, Naproxen, Naprosyn and Aspirin based products such as Excedrin, Goody's Powder, BC Powder.  Stop ANY OVER THE COUNTER supplements until after surgery: Multiple Vitamin (MULTIVITAMIN)   You may however, continue to take Tylenol if needed for pain up until the day of surgery.  TAKE ONLY THESE MEDICATIONS THE MORNING OF SURGERY WITH A SIP OF WATER:  omeprazole (PRILOSEC)  (take one the night before and one on the morning of surgery - helps to prevent nausea after surgery.) FLUoxetine (PROZAC)  atorvastatin (LIPITOR)    HOLD aspirin beginning 10/02/22.   No Alcohol for 24 hours before or after surgery.  No Smoking including e-cigarettes for 24 hours before surgery.  No chewable tobacco products for at least 6 hours before surgery.  No nicotine patches on the day of surgery.  Do not use any "recreational" drugs for at least a week (preferably 2 weeks) before your surgery.  Please be advised that the combination of cocaine and anesthesia may have negative outcomes, up to and including death. If you test positive for cocaine, your surgery will be cancelled.  On the morning of surgery brush your teeth with toothpaste and water, you may rinse your mouth with mouthwash if you wish. Do not swallow any toothpaste or mouthwash.  Do not wear jewelry, make-up, hairpins, clips or nail polish.  Do not wear lotions, powders, or  perfumes.   Do not shave body hair from the neck down 48 hours before surgery.  Contact lenses, hearing aids and dentures may not be worn into surgery.  Do not bring valuables to the hospital. [facility] is not responsible for any missing/lost belongings or valuables.   Notify your doctor if there is any change in your medical condition (cold, fever, infection).  Wear comfortable clothing (specific to your surgery type) to the hospital.  After surgery, you can help prevent lung complications by doing breathing exercises.  Take deep breaths and cough every 1-2 hours. Your doctor may order a device called an Incentive Spirometer to help you take deep breaths. When coughing or sneezing, hold a pillow firmly against your incision with both hands. This is called "splinting." Doing this helps protect your incision. It also decreases belly discomfort.  If you are being admitted to the hospital overnight, leave your suitcase in the car. After surgery it may be brought to your room.  In case of increased patient census, it may be necessary for you, the patient, to continue your postoperative care in the Same Day Surgery department.  If you are being discharged the day of surgery, you will not be allowed to drive home. You will need a responsible individual to drive you home and stay with you for 24 hours after surgery.   If you are taking public transportation, you will need to have a responsible individual with you.  Please call the Pre-admissions Testing Dept. at [phone] if you have any questions about these instructions.  Surgery  Visitation Policy:  Patients having surgery or a procedure may have two visitors.  Children under the age of 31 must have an adult with them who is not the patient.  Inpatient Visitation:    Visiting hours are 7 a.m. to 8 p.m. Up to four visitors are allowed at one time in a patient room. The visitors may rotate out with other people during the day.   One visitor age 27 or older may stay with the patient overnight and must be in the room by 8 p.m.

## 2022-09-30 ENCOUNTER — Other Ambulatory Visit: Payer: Self-pay | Admitting: Pulmonary Disease

## 2022-09-30 ENCOUNTER — Encounter
Admission: RE | Admit: 2022-09-30 | Discharge: 2022-09-30 | Disposition: A | Discharge status: Home / Self Care | Payer: PPO | Source: Ambulatory Visit | Attending: Pulmonary Disease | Admitting: Pulmonary Disease

## 2022-09-30 DIAGNOSIS — Z87891 Personal history of nicotine dependence: Secondary | ICD-10-CM | POA: Insufficient documentation

## 2022-09-30 DIAGNOSIS — R911 Solitary pulmonary nodule: Secondary | ICD-10-CM

## 2022-09-30 DIAGNOSIS — J449 Chronic obstructive pulmonary disease, unspecified: Secondary | ICD-10-CM

## 2022-09-30 DIAGNOSIS — Z01818 Encounter for other preprocedural examination: Secondary | ICD-10-CM | POA: Diagnosis not present

## 2022-09-30 DIAGNOSIS — Z01812 Encounter for preprocedural laboratory examination: Secondary | ICD-10-CM

## 2022-09-30 LAB — BASIC METABOLIC PANEL
Anion gap: 10 (ref 5–15)
BUN: 14 mg/dL (ref 8–23)
CO2: 24 mmol/L (ref 22–32)
Calcium: 9.4 mg/dL (ref 8.9–10.3)
Chloride: 105 mmol/L (ref 98–111)
Creatinine, Ser: 0.85 mg/dL (ref 0.61–1.24)
GFR, Estimated: >60 mL/min (ref >60)
Glucose, Bld: 72 mg/dL (ref 70–99)
Potassium: 3.7 mmol/L (ref 3.5–5.1)
Sodium: 139 mmol/L (ref 135–145)

## 2022-09-30 LAB — CBC
HCT: 40.4 % (ref 39.0–52.0)
Hemoglobin: 13.7 g/dL (ref 13.0–17.0)
MCH: 32.2 pg (ref 26.0–34.0)
MCHC: 33.9 g/dL (ref 30.0–36.0)
MCV: 94.8 fL (ref 80.0–100.0)
Platelets: 299 10*3/uL (ref 150–400)
RBC: 4.26 MIL/uL (ref 4.22–5.81)
RDW: 13.1 % (ref 11.5–15.5)
WBC: 8.3 10*3/uL (ref 4.0–10.5)
nRBC: 0 % (ref 0.0–0.2)

## 2022-10-04 ENCOUNTER — Ambulatory Visit
Admission: RE | Admit: 2022-10-04 | Discharge: 2022-10-04 | Disposition: A | Discharge status: Home / Self Care | Payer: PPO | Source: Ambulatory Visit | Attending: Pulmonary Disease | Admitting: Pulmonary Disease

## 2022-10-04 DIAGNOSIS — J432 Centrilobular emphysema: Secondary | ICD-10-CM | POA: Diagnosis not present

## 2022-10-04 DIAGNOSIS — R918 Other nonspecific abnormal finding of lung field: Secondary | ICD-10-CM | POA: Diagnosis not present

## 2022-10-04 DIAGNOSIS — J449 Chronic obstructive pulmonary disease, unspecified: Secondary | ICD-10-CM | POA: Diagnosis not present

## 2022-10-04 DIAGNOSIS — Z01818 Encounter for other preprocedural examination: Secondary | ICD-10-CM | POA: Diagnosis not present

## 2022-10-04 DIAGNOSIS — R911 Solitary pulmonary nodule: Secondary | ICD-10-CM | POA: Diagnosis not present

## 2022-10-07 ENCOUNTER — Encounter
Admission: RE | Disposition: A | Discharge status: Home / Self Care | Payer: Self-pay | Source: Ambulatory Visit | Attending: Pulmonary Disease

## 2022-10-07 ENCOUNTER — Ambulatory Visit: Payer: PPO

## 2022-10-07 ENCOUNTER — Ambulatory Visit: Payer: PPO | Admitting: Urgent Care

## 2022-10-07 ENCOUNTER — Ambulatory Visit
Admission: RE | Admit: 2022-10-07 | Discharge: 2022-10-07 | Disposition: A | Discharge status: Home / Self Care | Payer: PPO | Source: Ambulatory Visit | Attending: Pulmonary Disease | Admitting: Pulmonary Disease

## 2022-10-07 ENCOUNTER — Other Ambulatory Visit: Payer: Self-pay

## 2022-10-07 DIAGNOSIS — I700 Atherosclerosis of aorta: Secondary | ICD-10-CM | POA: Diagnosis not present

## 2022-10-07 DIAGNOSIS — Z8612 Personal history of poliomyelitis: Secondary | ICD-10-CM | POA: Diagnosis not present

## 2022-10-07 DIAGNOSIS — K219 Gastro-esophageal reflux disease without esophagitis: Secondary | ICD-10-CM | POA: Diagnosis not present

## 2022-10-07 DIAGNOSIS — R911 Solitary pulmonary nodule: Secondary | ICD-10-CM | POA: Insufficient documentation

## 2022-10-07 DIAGNOSIS — J439 Emphysema, unspecified: Secondary | ICD-10-CM | POA: Diagnosis not present

## 2022-10-07 DIAGNOSIS — R918 Other nonspecific abnormal finding of lung field: Secondary | ICD-10-CM | POA: Diagnosis not present

## 2022-10-07 DIAGNOSIS — Z09 Encounter for follow-up examination after completed treatment for conditions other than malignant neoplasm: Secondary | ICD-10-CM | POA: Insufficient documentation

## 2022-10-07 DIAGNOSIS — I2510 Atherosclerotic heart disease of native coronary artery without angina pectoris: Secondary | ICD-10-CM | POA: Insufficient documentation

## 2022-10-07 DIAGNOSIS — D3501 Benign neoplasm of right adrenal gland: Secondary | ICD-10-CM | POA: Insufficient documentation

## 2022-10-07 DIAGNOSIS — D3502 Benign neoplasm of left adrenal gland: Secondary | ICD-10-CM | POA: Diagnosis not present

## 2022-10-07 DIAGNOSIS — Z48813 Encounter for surgical aftercare following surgery on the respiratory system: Secondary | ICD-10-CM | POA: Diagnosis not present

## 2022-10-07 DIAGNOSIS — Z85841 Personal history of malignant neoplasm of brain: Secondary | ICD-10-CM | POA: Diagnosis not present

## 2022-10-07 DIAGNOSIS — C3411 Malignant neoplasm of upper lobe, right bronchus or lung: Secondary | ICD-10-CM | POA: Insufficient documentation

## 2022-10-07 DIAGNOSIS — R590 Localized enlarged lymph nodes: Secondary | ICD-10-CM | POA: Diagnosis not present

## 2022-10-07 DIAGNOSIS — J432 Centrilobular emphysema: Secondary | ICD-10-CM | POA: Diagnosis not present

## 2022-10-07 DIAGNOSIS — Z87891 Personal history of nicotine dependence: Secondary | ICD-10-CM | POA: Insufficient documentation

## 2022-10-07 DIAGNOSIS — Z01812 Encounter for preprocedural laboratory examination: Secondary | ICD-10-CM | POA: Insufficient documentation

## 2022-10-07 DIAGNOSIS — M1990 Unspecified osteoarthritis, unspecified site: Secondary | ICD-10-CM | POA: Insufficient documentation

## 2022-10-07 DIAGNOSIS — J449 Chronic obstructive pulmonary disease, unspecified: Secondary | ICD-10-CM | POA: Diagnosis not present

## 2022-10-07 HISTORY — PX: VIDEO BRONCHOSCOPY WITH ENDOBRONCHIAL ULTRASOUND: SHX6177

## 2022-10-07 LAB — PROTIME-INR
INR: 1.1 (ref 0.8–1.2)
Prothrombin Time: 14.7 seconds (ref 11.4–15.2)

## 2022-10-07 LAB — APTT: aPTT: 26 seconds (ref 24–36)

## 2022-10-07 SURGERY — BRONCHOSCOPY, WITH EBUS
Anesthesia: General

## 2022-10-07 MED ORDER — LACTATED RINGERS IV SOLN: INTRAVENOUS | Status: DC

## 2022-10-07 MED ORDER — ONDANSETRON HCL 4 MG/2ML IJ SOLN
INTRAMUSCULAR | Status: DC | PRN
  Administered 2022-10-07: 4 mg via INTRAVENOUS

## 2022-10-07 MED ORDER — DEXAMETHASONE SODIUM PHOSPHATE 10 MG/ML IJ SOLN
INTRAMUSCULAR | Status: AC
  Filled 2022-10-07: qty 1

## 2022-10-07 MED ORDER — ONDANSETRON HCL 4 MG/2ML IJ SOLN
INTRAMUSCULAR | Status: AC
  Filled 2022-10-07: qty 2

## 2022-10-07 MED ORDER — OXYCODONE HCL 5 MG/5ML PO SOLN: 5.0000 mg | Freq: Once | ORAL | Status: DC | PRN

## 2022-10-07 MED ORDER — EPHEDRINE SULFATE (PRESSORS) 50 MG/ML IJ SOLN
INTRAMUSCULAR | Status: DC | PRN
  Administered 2022-10-07: 5 mg via INTRAVENOUS
  Administered 2022-10-07 (×3): 10 mg via INTRAVENOUS

## 2022-10-07 MED ORDER — ORAL CARE MOUTH RINSE: 15.0000 mL | Freq: Once | OROMUCOSAL | Status: AC

## 2022-10-07 MED ORDER — FENTANYL CITRATE (PF) 100 MCG/2ML IJ SOLN: 25.0000 ug | INTRAMUSCULAR | Status: DC | PRN

## 2022-10-07 MED ORDER — ROCURONIUM BROMIDE 100 MG/10ML IV SOLN
INTRAVENOUS | Status: DC | PRN
  Administered 2022-10-07: 10 mg via INTRAVENOUS
  Administered 2022-10-07: 50 mg via INTRAVENOUS
  Administered 2022-10-07: 10 mg via INTRAVENOUS

## 2022-10-07 MED ORDER — LIDOCAINE HCL (CARDIAC) PF 100 MG/5ML IV SOSY
PREFILLED_SYRINGE | INTRAVENOUS | Status: DC | PRN
  Administered 2022-10-07: 60 mg via INTRAVENOUS

## 2022-10-07 MED ORDER — PROPOFOL 10 MG/ML IV BOLUS
INTRAVENOUS | Status: DC | PRN
  Administered 2022-10-07: 100 mg via INTRAVENOUS
  Administered 2022-10-07: 20 mg via INTRAVENOUS

## 2022-10-07 MED ORDER — EPHEDRINE 5 MG/ML INJ
INTRAVENOUS | Status: AC
  Filled 2022-10-07: qty 5

## 2022-10-07 MED ORDER — LIDOCAINE HCL (PF) 2 % IJ SOLN
INTRAMUSCULAR | Status: AC
  Filled 2022-10-07: qty 5

## 2022-10-07 MED ORDER — FENTANYL CITRATE (PF) 100 MCG/2ML IJ SOLN
INTRAMUSCULAR | Status: DC | PRN
  Administered 2022-10-07: 50 ug via INTRAVENOUS

## 2022-10-07 MED ORDER — SUGAMMADEX SODIUM 200 MG/2ML IV SOLN
INTRAVENOUS | Status: DC | PRN
  Administered 2022-10-07: 200 mg via INTRAVENOUS

## 2022-10-07 MED ORDER — CHLORHEXIDINE GLUCONATE 0.12 % MT SOLN
OROMUCOSAL | Status: AC
  Filled 2022-10-07: qty 15

## 2022-10-07 MED ORDER — CHLORHEXIDINE GLUCONATE 0.12 % MT SOLN
15.0000 mL | Freq: Once | OROMUCOSAL | Status: AC
  Administered 2022-10-07: 15 mL via OROMUCOSAL

## 2022-10-07 MED ORDER — PHENYLEPHRINE HCL (PRESSORS) 10 MG/ML IV SOLN
INTRAVENOUS | Status: DC | PRN
  Administered 2022-10-07 (×2): 200 ug via INTRAVENOUS

## 2022-10-07 MED ORDER — PROPOFOL 10 MG/ML IV BOLUS
INTRAVENOUS | Status: AC
  Filled 2022-10-07: qty 20

## 2022-10-07 MED ORDER — SEVOFLURANE IN SOLN
RESPIRATORY_TRACT | Status: AC
  Filled 2022-10-07: qty 250

## 2022-10-07 MED ORDER — FENTANYL CITRATE (PF) 100 MCG/2ML IJ SOLN
INTRAMUSCULAR | Status: AC
  Filled 2022-10-07: qty 2

## 2022-10-07 MED ORDER — DEXAMETHASONE SODIUM PHOSPHATE 10 MG/ML IJ SOLN
INTRAMUSCULAR | Status: DC | PRN
  Administered 2022-10-07: 5 mg via INTRAVENOUS

## 2022-10-07 MED ORDER — OXYCODONE HCL 5 MG PO TABS: 5.0000 mg | ORAL_TABLET | Freq: Once | ORAL | Status: DC | PRN

## 2022-10-07 NOTE — Discharge Instructions (Signed)
AMBULATORY SURGERY  ?DISCHARGE INSTRUCTIONS ? ? ?The drugs that you were given will stay in your system until tomorrow so for the next 24 hours you should not: ? ?Drive an automobile ?Make any legal decisions ?Drink any alcoholic beverage ? ? ?You may resume regular meals tomorrow.  Today it is better to start with liquids and gradually work up to solid foods. ? ?You may eat anything you prefer, but it is better to start with liquids, then soup and crackers, and gradually work up to solid foods. ? ? ?Please notify your doctor immediately if you have any unusual bleeding, trouble breathing, redness and pain at the surgery site, drainage, fever, or pain not relieved by medication. ? ? ? ?Additional Instructions: ? ? ? ?Please contact your physician with any problems or Same Day Surgery at [phone], Monday through Friday 6 am to 4 pm, or [place] [facility] [place] Main number at [phone].  ?

## 2022-10-07 NOTE — Anesthesia Procedure Notes (Signed)
Procedure Name: Intubation Date/Time: 10/07/2022 1:30 PM  Performed by: Karoline Caldwell, CRNAPre-anesthesia Checklist: Patient identified, Patient being monitored, Timeout performed, Emergency Drugs available and Suction available Patient Re-evaluated:Patient Re-evaluated prior to induction Oxygen Delivery Method: Circle system utilized Preoxygenation: Pre-oxygenation with 100% oxygen Induction Type: IV induction Ventilation: Mask ventilation without difficulty Laryngoscope Size: Mac and 4 Grade View: Grade I Tube type: Oral Tube size: 8.5 mm Number of attempts: 1 Airway Equipment and Method: Stylet Placement Confirmation: ETT inserted through vocal cords under direct vision, positive ETCO2 and breath sounds checked- equal and bilateral Secured at: 21 cm Tube secured with: Tape Dental Injury: Teeth and Oropharynx as per pre-operative assessment

## 2022-10-07 NOTE — Anesthesia Preprocedure Evaluation (Addendum)
Anesthesia Evaluation  Patient identified by MRN, date of birth, ID band Patient awake    Reviewed: Allergy & Precautions, NPO status , Patient's Chart, lab work & pertinent test results  History of Anesthesia Complications Negative for: history of anesthetic complications  Airway Mallampati: II  TM Distance: >3 FB Neck ROM: full    Dental  (+) Edentulous Lower, Edentulous Upper   Pulmonary sleep apnea , COPD, Patient abstained from smoking., former smoker RUL nodule   Pulmonary exam normal        Cardiovascular + Peripheral Vascular Disease  Normal cardiovascular exam     Neuro/Psych  PSYCHIATRIC DISORDERS  Depression    CVA (R sided hemiparesis), Residual Symptoms    GI/Hepatic Neg liver ROS,GERD  Controlled,,  Endo/Other  negative endocrine ROS    Renal/GU      Musculoskeletal   Abdominal   Peds  Hematology negative hematology ROS (+)   Anesthesia Other Findings Past Medical History: No date: Arthritis pain No date: Cancer (HCC)     Comment:  brain tumor No date: COPD (chronic obstructive pulmonary disease) (HCC) No date: Depression No date: GERD (gastroesophageal reflux disease) No date: Hemiparesis (HCC) No date: Hyperlipidemia No date: Pneumonia No date: Polio     Comment:  age 72 No date: PVD (peripheral vascular disease) (HCC) No date: Status post placement of implantable loop recorder No date: Stroke [facility])  Past Surgical History: No date: BRAIN TUMOR EXCISION 04/09/2011: COLONOSCOPY     Comment:  Dr Mechele Collin 05/12/2017: COLONOSCOPY WITH PROPOFOL; N/A     Comment:  Procedure: COLONOSCOPY WITH PROPOFOL;  Surgeon: Scot Jun, MD;  Location: [facility] ENDOSCOPY;  Service:               Endoscopy;  Laterality: N/A; 03/28/2014: HERNIA REPAIR; Right     Comment:  inguinal hernia repair  BMI    Body Mass Index: 23.56 kg/m      Reproductive/Obstetrics negative OB ROS                              Anesthesia Physical Anesthesia Plan  ASA: 3  Anesthesia Plan: General ETT   Post-op Pain Management: Toradol IV (intra-op)*, Ofirmev IV (intra-op)* and Ketamine IV*   Induction: Intravenous  PONV Risk Score and Plan: 2 and Ondansetron, Dexamethasone and Treatment may vary due to age or medical condition  Airway Management Planned: Oral ETT  Additional Equipment:   Intra-op Plan:   Post-operative Plan: Extubation in OR  Informed Consent: I have reviewed the patients History and Physical, chart, labs and discussed the procedure including the risks, benefits and alternatives for the proposed anesthesia with the patient or authorized representative who has indicated his/her understanding and acceptance.     Dental Advisory Given  Plan Discussed with: Anesthesiologist, CRNA and Surgeon  Anesthesia Plan Comments: (Patient consented for risks of anesthesia including but not limited to:  - adverse reactions to medications - damage to eyes, teeth, lips or other oral mucosa - nerve damage due to positioning  - sore throat or hoarseness - Damage to heart, brain, nerves, lungs, other parts of body or loss of life  Patient voiced understanding.)        Anesthesia Quick Evaluation

## 2022-10-07 NOTE — Procedures (Addendum)
ROTOBIC NAVIGATIONAL BRONCHOSCOPY PROCEDURE NOTE  FIBEROPTIC BRONCHOSCOPY WITH BRONCHOALVEOLAR LAVAGE AND THERAPEUTIC ASPIRATION OF TRACHEOBRONCHIAL TREE PROCEDURE NOTE  ENDOBRONCHIAL ULTRASOUND PROCEDURE NOTE X 2 LYMPH NODES    Flexible bronchoscopy was performed  by : Karna Christmas MD  assistance by : 1)Repiratory therapist  and 2)[employer] staff and 3) Anesthesia team and 4) Flouroscopy team and 5) [employer] supporting staff   Indication for the procedure was :  Pre-procedural H&P. The following assessment was performed on the day of the procedure prior to initiating sedation History:  Chest pain n Dyspnea y Hemoptysis n Cough y Fever n Other pertinent items n  Examination Vital signs -reviewed as per nursing documentation today Cardiac    Murmurs: n  Rubs : n  Gallop: n Lungs Wheezing: n Rales : n Rhonchi :y  Other pertinent findings: SOB/hypoxemia due to chronic lung disease   Pre-procedural assessment for Procedural Sedation included: Depth of sedation: As per anesthesia team  ASA Classification:  2 Mallampati airway assessment: 3    Medication list reviewed: y  The patient's interval history was taken and revealed: no new complaints The pre- procedure physical examination revealed: No new findings Refer to prior clinic note for details.  Informed Consent: Informed consent was obtained from:  patient after explanation of procedure and risks, benefits, as well as alternative procedures available.  Explanation of level of sedation and possible transfusion was also provided.    Procedural Preparation: Time out was performed and patient was identified by name and birthdate and procedure to be performed and side for sampling, if any, was specified. Pt was intubated by anesthesia.  The patient was appropriately draped.   Fiberoptic bronchoscopy with airway inspection and BAL Procedure findings:  Bronchoscope was inserted via ETT  without difficulty.   Posterior oropharynx, epiglottis, arytenoids, false cords and vocal cords were not visualized as these were bypassed by endotracheal tube. The distal trachea was normal in circumference and appearance without mucosal, cartilaginous or branching abnormalities.  The main carina was mildly splayed . All right and left lobar airways were visualized to the Subsegmental level.  Sub- sub segmental carinae were identified in all the distal airways.   Secretions were visible in the following airways and appeared to be clear.  The mucosa was : friable at RUL  Airways were notable for:        exophytic lesions :n       extrinsic compression in the following distributions: n.       Friable mucosa: y       Teacher, music /pigmentation: n    MUCUS PLUGGING BILATERALLY - EVACUTED VIA MULTIPLE THERAPEUTIC ASPIRATIONS  BAL PERFORMED AT RUL FOR FUGAL/BACTERIAL MICROBIOLOGY X 2  ANTHRACOTIC PIGMENT THROUGHOUT  Post procedure Diagnosis:   MUCUS PLUGGING AND ANTHROCOTIC PIGMENT     Robotic Navigational Bronchoscopy Procedure Findings:  After appropriate CT-guided planning ENB scope was advanced via endotracheal tube and LG was advanced for registration.  Post appropriate planning and registration peripheral navigation was used to visualize target lesion.    BELOW ARE CYTOTECH FINDINGS:  CYTOBRUSH - ENDOBRONCHIAL X 4 = ATYPICAL  SURGICAL TRANSBRONCHIAL BIOPSY X 2 - LESIONAL CARCINOMA SURGICAL ENDOBRONCHIAL BIOPSY X 2 - LESIONAL CARCINOMA Post procedure diagnosis: LESIONAL CARCINOMA OF RUL      Endobronchial ultrasound assisted hilar and mediastinal lymph node biopsies procedure findings: The fiberoptic bronchoscope was removed and the EBUS scope was introduced. Examination began to evaluate for pathologically enlarged lymph nodes starting on the LEFT  side progressing  to the RIGHT side.  All lymph node biopsies performed with 21G needle. Lymph node biopsies were sent in cytolite for all  stations.  STATION 7 - 1.3cm- 3 biopsies - in cytolyte STATION 10R - 1.2cm- 4 biopsies - cytolyte  Post procedure diagnosis:  lymphadenopathy   Specimens obtained included:   Broncho-alveolar lavage site:right upper lobe   sent for micro and cytology                              60ml volume infused 40ml volume returned with cellular  appearance   Fluoroscopy Used: yes ;        Pictorial documentation attached: none                   Immediate sampling complications included:NONE  Epinephrine NONE ml was used topically  The bronchoscopy was terminated due to completion of the planned procedure and the bronchoscope was removed.   Total dosage of Lidocaine was NONE mg Total fluoroscopy time was AS PER RADIOLOGY  minutes  Supplemental oxygen was provided at AS PER ANESTHESIA lpm by nasal canula post operatively  Estimated Blood loss: EXPECTED <10cc.  Complications included:  NONE IMMEDIATE   Preliminary CXR findings :  IN PROCESS   Disposition: HOME WITH WIFE  Follow up with Dr. Karna Christmas in 5 days for result discussion.     Vida Rigger MD  [facility] Division of Pulmonary & Critical Care Medicine

## 2022-10-07 NOTE — Transfer of Care (Signed)
Immediate Anesthesia Transfer of Care Note  Patient: Jeremy Donnel Sr.  Procedure(s) Performed: VIDEO BRONCHOSCOPY WITH ENDOBRONCHIAL ULTRASOUND ROBOTIC ASSISTED NAVIGATIONAL BRONCHOSCOPY  Patient Location: PACU  Anesthesia Type:General  Level of Consciousness: awake, alert , and oriented  Airway & Oxygen Therapy: Patient Spontanous Breathing  Post-op Assessment: Report given to RN and Post -op Vital signs reviewed and stable  Post vital signs: Reviewed and stable  Last Vitals:  Vitals Value Taken Time  BP 132/64 10/07/22 1530  Temp 36.4 C 10/07/22 1530  Pulse 80 10/07/22 1531  Resp 15 10/07/22 1530  SpO2 100 % 10/07/22 1531  Vitals shown include unvalidated device data.  Last Pain:  Vitals:   10/07/22 1112  PainSc: 0-No pain         Complications: No notable events documented.

## 2022-10-07 NOTE — H&P (Addendum)
PULMONOLOGY         Date: 10/07/2022,   MRN# [id number] Jeremy Caron Sr. November 11, 1950     AdmissionWeight: 60.3 kg                 CurrentWeight: 60.3 kg  Referring provider: Dr Meredeth Ide    CHIEF COMPLAINT:   Lung nodules of bilateral upper lobes suspicious for cancer   HISTORY OF PRESENT ILLNESS   This is a 72 yo M with hx of brain tumor, GERD, Polio, COPD, arthritis, who came in to pulmonary clinic and was noted to have lung nodules.  He has moderate pre-test probability of lung cancer due to COPD and smoking.  He had CT chest done with findings of biapical GGO nodules worse on left. He is here today for tissue diagnosis via bronchoscopy.  We plan to utilize robotic bronchoscopy for lung biopsy, EBUS for lymph node staging, flexible bronchoscopy for initial airway inspection, mucus plugging and BAL.  Reviewed risks/complications and benefits with patient, risks include infection, pneumothorax/pneumomediastinum which may require chest tube placement as well as overnight/prolonged hospitalization and possible mechanical ventilation. Other risks include bleeding and very rarely death.  Patient understands risks and wishes to proceed.  Additional questions were answered, and patient is aware that post procedure patient will be going home with family and may experience cough with possible clots on expectoration as well as phlegm which may last few days as well as hoarseness of voice post intubation and mechanical ventilation.    PAST MEDICAL HISTORY   Past Medical History:  Diagnosis Date   Arthritis pain    Cancer (HCC)    brain tumor   COPD (chronic obstructive pulmonary disease) (HCC)    Depression    GERD (gastroesophageal reflux disease)    Hemiparesis (HCC)    Hyperlipidemia    Pneumonia    Polio    age 34   PVD (peripheral vascular disease) (HCC)    Status post placement of implantable loop recorder    Stroke [facility])      SURGICAL HISTORY   Past  Surgical History:  Procedure Laterality Date   BRAIN TUMOR EXCISION     COLONOSCOPY  04/09/2011   Dr Mechele Collin   COLONOSCOPY WITH PROPOFOL N/A 05/12/2017   Procedure: COLONOSCOPY WITH PROPOFOL;  Surgeon: Scot Jun, MD;  Location: [facility] ENDOSCOPY;  Service: Endoscopy;  Laterality: N/A;   HERNIA REPAIR Right 03/28/2014   inguinal hernia repair     FAMILY HISTORY   Family History  Problem Relation Age of Onset   Cancer Father        colon     SOCIAL HISTORY   Social History   Tobacco Use   Smoking status: Former    Packs/day: 2.00    Years: 50.00    Additional pack years: 0.00    Total pack years: 100.00    Types: Cigarettes    Quit date: 06/10/2015    Years since quitting: 7.3   Smokeless tobacco: Never  Substance Use Topics   Alcohol use: No    Alcohol/week: 0.0 standard drinks of alcohol   Drug use: No     MEDICATIONS    Home Medication:    Current Medication:  Current Facility-Administered Medications:    lactated ringers infusion, , Intravenous, Continuous, Jeremy Gubler, MD, Last Rate: 10 mL/hr at 10/07/22 1133, New Bag at 10/07/22 1133    ALLERGIES   Patient has no known allergies.     REVIEW OF  SYSTEMS    Review of Systems:  Gen:  Denies  fever, sweats, chills weigh loss  HEENT: Denies blurred vision, double vision, ear pain, eye pain, hearing loss, nose bleeds, sore throat Cardiac:  No dizziness, chest pain or heaviness, chest tightness,edema Resp:   reports dyspnea chronically  Gi: Denies swallowing difficulty, stomach pain, nausea or vomiting, diarrhea, constipation, bowel incontinence Gu:  Denies bladder incontinence, burning urine Ext:   Denies Joint pain, stiffness or swelling Skin: Denies  skin rash, easy bruising or bleeding or hives Endoc:  Denies polyuria, polydipsia , polyphagia or weight change Psych:   Denies depression, insomnia or hallucinations   Other:  All other systems negative   VS: BP (!) 128/101   Pulse 86    Temp (!) 97.4 F (36.3 C)   Resp 14   Ht 5\' 3"  (1.6 m)   Wt 60.3 kg   SpO2 100%   BMI 23.56 kg/m      PHYSICAL EXAM    GENERAL:NAD, no fevers, chills, no weakness no fatigue HEAD: Normocephalic, atraumatic.  EYES: Pupils equal, round, reactive to light. Extraocular muscles intact. No scleral icterus.  MOUTH: Moist mucosal membrane. Dentition intact. No abscess noted.  EAR, NOSE, THROAT: Clear without exudates. No external lesions.  NECK: Supple. No thyromegaly. No nodules. No JVD.  PULMONARY: decreased breath sounds with mild rhonchi worse at bases bilaterally.  CARDIOVASCULAR: S1 and S2. Regular rate and rhythm. No murmurs, rubs, or gallops. No edema. Pedal pulses 2+ bilaterally.  GASTROINTESTINAL: Soft, nontender, nondistended. No masses. Positive bowel sounds. No hepatosplenomegaly.  MUSCULOSKELETAL: No swelling, clubbing, or edema. Range of motion full in all extremities.  NEUROLOGIC: Cranial nerves II through XII are intact. No gross focal neurological deficits. Sensation intact. Reflexes intact.  SKIN: No ulceration, lesions, rashes, or cyanosis. Skin warm and dry. Turgor intact.  PSYCHIATRIC: Mood, affect within normal limits. The patient is awake, alert and oriented x 3. Insight, judgment intact.       IMAGING    ASSESSMENT/PLAN   Apical nodules of bilateral upper lobes  -Patient has larger lesion on right 1.7 cm and smaller on left upper lobe nodule both upper lobe locations  -there is underlying COPD with centrilobular and paraseptal emphysema, we may not biopsy both sides today due to increased risk of pneumothorax bilaterally   -Reviewed risks/complications and benefits with patient, risks include infection, pneumothorax/pneumomediastinum which may require chest tube placement as well as overnight/prolonged hospitalization and possible mechanical ventilation. Other risks include bleeding and very rarely death.  Patient understands risks and wishes to proceed.   Additional questions were answered, and patient is aware that post procedure patient will be going home with family and may experience cough with possible clots on expectoration as well as phlegm which may last few days as well as hoarseness of voice post intubation and mechanical ventilation.        Thank you for allowing me to participate in the care of this patient.   Patient/Family are satisfied with care plan and all questions have been answered.    Provider disclosure: Patient with at least one acute or chronic illness or injury that poses a threat to life or bodily function and is being managed actively during this encounter.  All of the below services have been performed independently by signing provider:  review of prior documentation from internal and or external health records.  Review of previous and current lab results.  Interview and comprehensive assessment during patient visit today. Review of  current and previous chest radiographs/CT scans. Discussion of management and test interpretation with health care team and patient/family.   This document was prepared using Dragon voice recognition software and may include unintentional dictation errors.     Ottie Glazier, M.D.  Division of Pulmonary & Critical Care Medicine

## 2022-10-08 ENCOUNTER — Encounter: Payer: Self-pay | Admitting: Pulmonary Disease

## 2022-10-09 ENCOUNTER — Other Ambulatory Visit: Payer: Self-pay | Admitting: Pathology

## 2022-10-09 LAB — CULTURE, BAL-QUANTITATIVE W GRAM STAIN

## 2022-10-10 LAB — CULTURE, BAL-QUANTITATIVE W GRAM STAIN: Culture: 60000 — AB

## 2022-10-10 LAB — ASPERGILLUS ANTIGEN, BAL/SERUM: Aspergillus Ag, BAL/Serum: 0.15 Index (ref 0.00–0.49)

## 2022-10-15 NOTE — Anesthesia Postprocedure Evaluation (Signed)
Anesthesia Post Note  Patient: Jeremy Verissimo Sr.  Procedure(s) Performed: VIDEO BRONCHOSCOPY WITH ENDOBRONCHIAL ULTRASOUND ROBOTIC ASSISTED NAVIGATIONAL BRONCHOSCOPY  Patient location during evaluation: PACU Anesthesia Type: General Level of consciousness: awake and alert Pain management: pain level controlled Vital Signs Assessment: post-procedure vital signs reviewed and stable Respiratory status: spontaneous breathing, nonlabored ventilation, respiratory function stable and patient connected to nasal cannula oxygen Cardiovascular status: blood pressure returned to baseline and stable Postop Assessment: no apparent nausea or vomiting Anesthetic complications: no   No notable events documented.   Last Vitals:  Vitals:   10/07/22 1557 10/07/22 1608  BP: 124/76 126/74  Pulse: 76 79  Resp: 16 18  Temp: (!) 36.4 C   SpO2: 96% 95%    Last Pain:  Vitals:   10/07/22 1608  PainSc: 0-No pain                 Yevette Edwards

## 2022-10-17 ENCOUNTER — Inpatient Hospital Stay: Payer: PPO | Attending: Oncology

## 2022-10-17 DIAGNOSIS — C3411 Malignant neoplasm of upper lobe, right bronchus or lung: Secondary | ICD-10-CM | POA: Insufficient documentation

## 2022-10-17 DIAGNOSIS — Z87891 Personal history of nicotine dependence: Secondary | ICD-10-CM | POA: Insufficient documentation

## 2022-10-17 DIAGNOSIS — Z79899 Other long term (current) drug therapy: Secondary | ICD-10-CM | POA: Insufficient documentation

## 2022-10-17 DIAGNOSIS — Z800 Family history of malignant neoplasm of digestive organs: Secondary | ICD-10-CM | POA: Insufficient documentation

## 2022-10-21 ENCOUNTER — Encounter: Payer: Self-pay | Admitting: Pulmonary Disease

## 2022-10-21 DIAGNOSIS — C3411 Malignant neoplasm of upper lobe, right bronchus or lung: Secondary | ICD-10-CM | POA: Diagnosis not present

## 2022-10-22 ENCOUNTER — Encounter: Payer: Self-pay | Admitting: *Deleted

## 2022-10-22 DIAGNOSIS — C3490 Malignant neoplasm of unspecified part of unspecified bronchus or lung: Secondary | ICD-10-CM

## 2022-10-22 NOTE — Progress Notes (Signed)
Referral received. Pt scheduled for new pt visit with Dr. Smith Robert. Pt will need further staging with PET scan per Dr. Smith Robert. Will help coordinate appt and notify pt.

## 2022-10-25 ENCOUNTER — Inpatient Hospital Stay: Payer: PPO | Admitting: Oncology

## 2022-10-25 ENCOUNTER — Inpatient Hospital Stay: Payer: PPO

## 2022-10-28 ENCOUNTER — Encounter: Payer: Self-pay | Admitting: *Deleted

## 2022-10-28 ENCOUNTER — Inpatient Hospital Stay: Payer: PPO | Admitting: Oncology

## 2022-10-28 ENCOUNTER — Telehealth: Payer: Self-pay

## 2022-10-28 ENCOUNTER — Inpatient Hospital Stay: Payer: PPO

## 2022-10-28 ENCOUNTER — Encounter: Payer: Self-pay | Admitting: Pulmonary Disease

## 2022-10-28 ENCOUNTER — Encounter: Payer: Self-pay | Admitting: Oncology

## 2022-10-28 VITALS — BP 147/88 | HR 65 | Temp 97.3°F | Resp 16 | Ht 66.0 in | Wt 144.0 lb

## 2022-10-28 DIAGNOSIS — Z7189 Other specified counseling: Secondary | ICD-10-CM | POA: Diagnosis not present

## 2022-10-28 DIAGNOSIS — Z79899 Other long term (current) drug therapy: Secondary | ICD-10-CM | POA: Diagnosis not present

## 2022-10-28 DIAGNOSIS — Z87891 Personal history of nicotine dependence: Secondary | ICD-10-CM

## 2022-10-28 DIAGNOSIS — Z800 Family history of malignant neoplasm of digestive organs: Secondary | ICD-10-CM

## 2022-10-28 DIAGNOSIS — C3411 Malignant neoplasm of upper lobe, right bronchus or lung: Secondary | ICD-10-CM

## 2022-10-28 LAB — MISC LABCORP TEST (SEND OUT): Labcorp test code: 8474

## 2022-10-28 NOTE — Progress Notes (Signed)
Pt and family in for referral for malignant neoplasm of upper lobe, right bronchus or lung. History has pt has history of brain tumor and excision but patient, wife and son report that it was a skin cancer on his scalp and they are unaware of type of skin cancer.

## 2022-10-28 NOTE — Progress Notes (Signed)
Met with patient and his family during initial consult with Dr. Smith Robert. All questions answered during visit. Reviewed upcoming appts with patient. Informed that thoracic surgeon's office will call with appt once scheduled. Contact info given and instructed to call with any questions or needs. Pt and family verbalized understanding. Nothing further needed at this time.

## 2022-10-28 NOTE — Telephone Encounter (Signed)
Please disregard, erroneous note.  Chriss Driver, RN 10/28/22

## 2022-10-28 NOTE — Progress Notes (Signed)
Hematology/Oncology Consult note [facility] Telephone:([phone] [phone]  Patient Care Team: Lynnea Ferrier, MD as PCP - General (Internal Medicine) Hassan Rowan, MD (Inactive) as Consulting Physician (Family Medicine) Kieth Brightly, MD (General Surgery) Glory Buff, RN as Oncology Nurse Navigator   Name of the patient: Jeremy Harrell  [id number]  1950-07-12    Reason for referral-new diagnosis of lung cancer   Referring physician- Dr. Karna Christmas  Date of visit: 10/28/22   History of presenting illness- Patient is a 72 year old male with history of longstanding smoking the time he was 72 years of age.  He smoked 1 pack which lasted him for about 3 days.  He underwent a CT lung cancer screening program in March 2024 which showed mixed attenuation right upper lobe lung nodule with solid components.  Left upper lobe lung nodule is linear and may represent progressive scarring.  This was followed by a CT chest and a CT super D chest in June 2024.  CT showed no evidence of axillary mediastinal or hilar adenopathy.  Mild paraseptal emphysema.  Irregular subsolid 2.4 x 1.7 cm anterior right upper lobe lung nodule with a 0.5 cm solid component not significantly changed since March 2024 but has increased since 2018.  There were also 1.7 cm left upper lobe groundglass opacities and another linear solid 0.6 cm left upper lobe lung nodule.  These nodules were deemed to be okay with monitoring every 6 to 12 months but biopsy was recommended for the right upper lobe lung nodule.  Patient had a bronchoscopy guided biopsy which showed adenocarcinoma with lepidic growth.  Biopsy shows very focal adenocarcinoma with lepidic growth and invasive component could not be excluded in this sample.  Patient has been referred for further management.  At baseline he does not use any home oxygen.  He is independent of his ADLs and IADLs.  States that he has quit smoking in  the last 2 weeks.  ECOG PS- 1  Pain scale- 0   Review of systems- Review of Systems  Constitutional:  Positive for malaise/fatigue. Negative for chills, fever and weight loss.  HENT:  Negative for congestion, ear discharge and nosebleeds.   Eyes:  Negative for blurred vision.  Respiratory:  Negative for cough, hemoptysis, sputum production, shortness of breath and wheezing.   Cardiovascular:  Negative for chest pain, palpitations, orthopnea and claudication.  Gastrointestinal:  Negative for abdominal pain, blood in stool, constipation, diarrhea, heartburn, melena, nausea and vomiting.  Genitourinary:  Negative for dysuria, flank pain, frequency, hematuria and urgency.  Musculoskeletal:  Negative for back pain, joint pain and myalgias.  Skin:  Negative for rash.  Neurological:  Negative for dizziness, tingling, focal weakness, seizures, weakness and headaches.  Endo/Heme/Allergies:  Does not bruise/bleed easily.  Psychiatric/Behavioral:  Negative for depression and suicidal ideas. The patient does not have insomnia.     No Known Allergies  Patient Active Problem List   Diagnosis Date Noted   Malignant neoplasm of right upper lobe of lung (HCC) 10/28/2022   Personal history of tobacco use, presenting hazards to health 02/25/2017     Past Medical History:  Diagnosis Date   Arthritis pain    COPD (chronic obstructive pulmonary disease) (HCC)    Depression    GERD (gastroesophageal reflux disease)    Hemiparesis (HCC)    Hyperlipidemia    Pneumonia    Polio    age 50   PVD (peripheral vascular disease) (HCC)    Skin cancer  Status post placement of implantable loop recorder    Stroke [facility])      Past Surgical History:  Procedure Laterality Date   BRAIN TUMOR EXCISION     pt denies/states had a skin cancer removed from scalp   COLONOSCOPY  04/09/2011   Dr Mechele Collin   COLONOSCOPY WITH PROPOFOL N/A 05/12/2017   Procedure: COLONOSCOPY WITH PROPOFOL;  Surgeon: Scot Jun, MD;  Location: [facility] ENDOSCOPY;  Service: Endoscopy;  Laterality: N/A;   HERNIA REPAIR Right 03/28/2014   inguinal hernia repair   VIDEO BRONCHOSCOPY WITH ENDOBRONCHIAL ULTRASOUND N/A 10/07/2022   Procedure: VIDEO BRONCHOSCOPY WITH ENDOBRONCHIAL ULTRASOUND;  Surgeon: Vida Rigger, MD;  Location: [facility] ORS;  Service: Thoracic;  Laterality: N/A;    Social History   Socioeconomic History   Marital status: Married    Spouse name: Hilda Lias   Number of children: Not on file   Years of education: Not on file   Highest education level: Not on file  Occupational History   Not on file  Tobacco Use   Smoking status: Former    Current packs/day: 0.00    Average packs/day: 2.0 packs/day for 50.0 years (100.0 ttl pk-yrs)    Types: Cigarettes    Start date: 06/09/1965    Quit date: 06/10/2015    Years since quitting: 7.3   Smokeless tobacco: Never  Vaping Use   Vaping status: Never Used  Substance and Sexual Activity   Alcohol use: Not Currently   Drug use: No   Sexual activity: Yes  Other Topics Concern   Not on file  Social History Narrative   Not on file   Social Determinants of Health   Financial Resource Strain: Low Risk  (06/10/2022)   Received from [facility], [facility]   Overall Financial Resource Strain (CARDIA)    Difficulty of Paying Living Expenses: Not hard at all  Food Insecurity: No Food Insecurity (10/28/2022)   Hunger Vital Sign    Worried About Running Out of Food in the Last Year: Never true    Ran Out of Food in the Last Year: Never true  Transportation Needs: No Transportation Needs (10/28/2022)   PRAPARE - Administrator, Civil Service (Medical): No    Lack of Transportation (Non-Medical): No  Physical Activity: Not on file  Stress: Not on file  Social Connections: Not on file  Intimate Partner Violence: Not At Risk (10/28/2022)   Humiliation, Afraid, Rape, and Kick questionnaire    Fear of Current or  Ex-Partner: No    Emotionally Abused: No    Physically Abused: No    Sexually Abused: No     Family History  Problem Relation Age of Onset   Cancer Father        colon     Current Outpatient Medications:    aspirin EC 81 MG tablet, Take 81 mg by mouth daily., Disp: , Rfl:    atorvastatin (LIPITOR) 80 MG tablet, Take 80 mg by mouth daily., Disp: , Rfl:    FLUoxetine (PROZAC) 20 MG tablet, Take 20 mg by mouth daily., Disp: , Rfl:    Multiple Vitamin (MULTIVITAMIN) tablet, Take 1 tablet by mouth daily., Disp: , Rfl:    omeprazole (PRILOSEC) 20 MG capsule, Take 20 mg by mouth daily., Disp: , Rfl:    Physical exam:  Vitals:   10/28/22 1414  BP: (!) 147/88  Pulse: 65  Resp: 16  Temp: (!) 97.3 F (36.3 C)  TempSrc:  Tympanic  Weight: 144 lb (65.3 kg)  Height: 5\' 6"  (1.676 m)   Physical Exam Cardiovascular:     Rate and Rhythm: Normal rate and regular rhythm.     Heart sounds: Normal heart sounds.  Pulmonary:     Effort: Pulmonary effort is normal.     Breath sounds: Normal breath sounds.  Abdominal:     General: Bowel sounds are normal.     Palpations: Abdomen is soft.  Skin:    General: Skin is warm and dry.  Neurological:     Mental Status: He is alert and oriented to person, place, and time.           Latest Ref Rng & Units 09/30/2022    2:08 PM  CMP  Glucose 70 - 99 mg/dL 72   BUN 8 - 23 mg/dL 14   Creatinine 6.21 - 1.24 mg/dL 3.08   Sodium 657 - 846 mmol/L 139   Potassium 3.5 - 5.1 mmol/L 3.7   Chloride 98 - 111 mmol/L 105   CO2 22 - 32 mmol/L 24   Calcium 8.9 - 10.3 mg/dL 9.4       Latest Ref Rng & Units 09/30/2022    2:08 PM  CBC  WBC 4.0 - 10.5 K/uL 8.3   Hemoglobin 13.0 - 17.0 g/dL 96.2   Hematocrit 95.2 - 52.0 % 40.4   Platelets 150 - 400 K/uL 299     No images are attached to the encounter.  DG Chest Port 1 View  Result Date: 10/07/2022 CLINICAL DATA:  Post bronchoscopy. EXAM: PORTABLE CHEST 1 VIEW COMPARISON:  CT 10/07/2022 and  10/04/2022. FINDINGS: 1532 hours. Two views submitted. There is increased right apical opacity, most likely reflecting a small amount of pulmonary hemorrhage following bronchoscopy. Left apical densities are grossly unchanged from the scout examination from the earlier CT. No evidence of pneumothorax or significant pleural effusion. The heart size and mediastinal contours are stable. No acute osseous findings are evident. IMPRESSION: No evidence of pneumothorax following bronchoscopy. Increased right apical opacity, most likely reflecting a small amount of pulmonary hemorrhage following bronchoscopy. Electronically Signed   By: Carey Bullocks M.D.   On: 10/07/2022 16:22   DG C-Arm 1-60 Min-No Report  Result Date: 10/07/2022 Fluoroscopy was utilized by the requesting physician.  No radiographic interpretation.   DG C-Arm 1-60 Min-No Report  Result Date: 10/07/2022 Fluoroscopy was utilized by the requesting physician.  No radiographic interpretation.   CT CHEST WO CONTRAST  Result Date: 10/07/2022 CLINICAL DATA:  Pre-bronchoscopic evaluation for pulmonary nodules. * Tracking Code: BO * EXAM: CT CHEST WITHOUT CONTRAST TECHNIQUE: Multidetector CT imaging of the chest was performed following the standard protocol without IV contrast. RADIATION DOSE REDUCTION: This exam was performed according to the departmental dose-optimization program which includes automated exposure control, adjustment of the mA and/or kV according to patient size and/or use of iterative reconstruction technique. COMPARISON:  07/05/2022 screening chest CT. FINDINGS: Cardiovascular: Normal heart size. No significant pericardial effusion/thickening. Left anterior descending coronary atherosclerosis. Atherosclerotic nonaneurysmal thoracic aorta. Normal caliber pulmonary arteries. Mediastinum/Nodes: No significant thyroid nodules. Unremarkable esophagus. No pathologically enlarged axillary, mediastinal or hilar lymph nodes, noting limited  sensitivity for the detection of hilar adenopathy on this noncontrast study. Lungs/Pleura: No pneumothorax. No pleural effusion. Mild paraseptal and centrilobular emphysema with mild diffuse bronchial wall thickening. Irregular subsolid 2.4 x 1.7 cm anterior right upper lobe pulmonary nodule with 0.5 cm solid component (series 4/image 30), not appreciably changed from 2.3 x 1.6  cm CT with 0.5 cm solid component on 07/05/2022 CT, increased from 1.1 x 0.8 cm pure ground-glass nodule on 02/25/2017 CT. Ground-glass 1.7 cm left upper lobe nodule (series 4/image 32), stable from 07/05/2022 CT, increased from 0.5 cm on 02/25/2017 CT. Somewhat linear indistinct 0.6 cm medial left upper lobe nodule (series 4/image 33), stable from 07/05/2022 CT, slightly increased from 0.4 cm on 02/25/2017 CT. No acute consolidative airspace disease or new significant pulmonary nodules. Upper abdomen: Stable 2.7 cm right adrenal nodule with density -1 HU and stable 2.3 cm left adrenal nodule with density -7 HU, compatible with adenomas. Simple 5.6 cm upper right renal cyst, for which no follow-up imaging is recommended. Nonobstructing 3 mm right renal stone. Musculoskeletal: No aggressive appearing focal osseous lesions. Marked thoracic spondylosis. IMPRESSION: 1. Irregular subsolid 2.4 cm anterior right upper lobe pulmonary nodule with 0.5 cm solid component, not appreciably changed from 07/05/2022 CT, increased from 1.1 x 0.8 cm pure ground-glass nodule on 02/25/2017 CT. Findings are suspicious for low grade primary bronchogenic adenocarcinoma. Multidisciplinary thoracic oncology consultation suggested. 2. Ground-glass 1.7 cm left upper lobe nodule, stable from 07/05/2022 CT, increased from 0.5 cm on 02/25/2017 CT. Somewhat linear indistinct 0.6 cm medial left upper lobe nodule, stable from 07/05/2022 CT, slightly increased from 0.4 cm on 02/25/2017 CT. Continued noncontrast chest CT surveillance warranted for these nodules in 6-12 months.  3. No thoracic adenopathy. 4. One vessel coronary atherosclerosis. 5. Stable bilateral adrenal adenomas, for which no follow-up imaging is recommended. 6. Nonobstructing right nephrolithiasis. 7. Aortic Atherosclerosis (ICD10-I70.0) and Emphysema (ICD10-J43.9). Electronically Signed   By: Delbert Phenix M.D.   On: 10/07/2022 13:01   CT SUPER D CHEST WO MONARCH PILOT  Result Date: 10/07/2022 CLINICAL DATA:  Inpatient. Follow-up pulmonary nodule. * Tracking Code: BO * EXAM: CT CHEST WITHOUT CONTRAST TECHNIQUE: Multidetector CT imaging of the chest was performed using thin slice collimation for electromagnetic bronchoscopy planning purposes, without intravenous contrast. RADIATION DOSE REDUCTION: This exam was performed according to the departmental dose-optimization program which includes automated exposure control, adjustment of the mA and/or kV according to patient size and/or use of iterative reconstruction technique. COMPARISON:  10/04/2022 and 07/05/2022 chest CT. FINDINGS: Cardiovascular: Normal heart size. No significant pericardial effusion/thickening. Left anterior descending coronary atherosclerosis. Atherosclerotic nonaneurysmal thoracic aorta. Normal caliber pulmonary arteries. Mediastinum/Nodes: No significant thyroid nodules. Unremarkable esophagus. No pathologically enlarged axillary, mediastinal or hilar lymph nodes, noting limited sensitivity for the detection of hilar adenopathy on this noncontrast study. Lungs/Pleura: No pneumothorax. No pleural effusion. Mild paraseptal and centrilobular emphysema with mild diffuse bronchial wall thickening. Irregular sub solid 2.4 x 1.7 cm anterior right upper lobe pulmonary nodule with 0.5 cm solid component (series 4/image 30), not appreciably changed from 2.3 x 1.6 cm CT with 0.5 cm solid component on 07/05/2022 CT, increased from 1.1 x 0.8 cm pure ground-glass nodule on 02/25/2017 CT. Ground-glass 1.7 cm left upper lobe nodule (series 4/image 34), stable from  07/05/2022 CT, increased from 0.5 cm on 02/25/2017 CT. Somewhat linear solid 0.6 cm medial left upper lobe nodule (series 4/image 34), stable from 07/05/2022 CT, slightly increased from 0.4 cm on 02/25/2017 CT. No acute consolidative airspace disease or new significant pulmonary nodules. Upper abdomen: Stable 2.5 cm right adrenal nodule with density 3 HU and stable 2.0 cm left adrenal nodule with density -17 HU, compatible with adenomas. Simple 5.5 cm upper right renal cyst, for which no follow-up imaging is recommended. Musculoskeletal: No aggressive appearing focal osseous lesions. Marked thoracic spondylosis.  IMPRESSION: 1. Irregular subsolid 2.4 cm anterior right upper lobe pulmonary nodule with 0.5 cm solid component, not appreciably changed from 07/05/2022 CT, increased from 1.1 x 0.8 cm pure ground-glass nodule on 02/25/2017 CT. Findings remain suspicious for low grade primary bronchogenic adenocarcinoma. Multidisciplinary thoracic oncology consultation suggested. 2. Ground-glass 1.7 cm left upper lobe nodule, stable from 07/05/2022 CT, increased from 0.5 cm on 02/25/2017 CT. Somewhat linear solid 0.6 cm medial left upper lobe nodule, stable from 07/05/2022 CT, slightly increased from 0.4 cm on 02/25/2017 CT. Continued noncontrast chest CT surveillance warranted for these nodules in 6-12 months. 3. No thoracic adenopathy. 4. One vessel coronary atherosclerosis. 5. Stable bilateral adrenal adenomas, for which no follow-up imaging is recommend. 6. Aortic Atherosclerosis (ICD10-I70.0) and Emphysema (ICD10-J43.9). Electronically Signed   By: Delbert Phenix M.D.   On: 10/07/2022 12:55    Assessment and plan- Patient is a 72 y.o. male with newly diagnosed right upper lobe adenocarcinoma stage I [id number] here to discuss further management  I have reviewed CT chest images independently and discussed findings with the patient.  He was found to have a subsolid nodule in the right upper lungMeasuring 2.4 cm which  was sampled and was consistent with adenocarcinoma focally and invasive component could not be ruled out.  No abnormal appearing axillary mediastinal or hilar lymphadenopathy.  There were 2 other groundglass opacities noted in the left upper lobe which was deemed to be okay with radiologically monitoring every 6 months.  At this time I am getting a PET scan to see if there is any mediastinal involvement or evidence of distant metastatic disease which is unlikely.  I am referring him to Dr. Cliffton Asters from cardiothoracic surgery for surgical management of the biopsy-proven right upper lobe lung cancer.  His groundglass opacities in the left upper lobe can be monitored over time.  If patient is not deemed to be a surgical candidate or refuses surgery we will refer him to radiation oncology at that time.  No role for any neoadjuvant systemic treatment for a 2.4 cm lung cancer in the absence of lymph node involvement.   Cancer Staging  Malignant neoplasm of right upper lobe of lung [facility]) Staging form: Lung, AJCC 8th Edition - Clinical stage from 10/28/2022: Stage IA3 (cT1c, cN0, cM0) - Signed by Creig Hines, MD on 10/28/2022 Histopathologic type: Adenocarcinoma, NOS Stage prefix: Initial diagnosis     Thank you for this kind referral and the opportunity to participate in the care of this patient   Visit Diagnosis 1. Malignant neoplasm of right upper lobe of lung (HCC)   2. Goals of care, counseling/discussion     Dr. Owens Shark, MD, MPH [facility] at [facility] [phone] 10/28/2022

## 2022-10-29 LAB — CULTURE, FUNGUS WITHOUT SMEAR

## 2022-10-31 ENCOUNTER — Ambulatory Visit
Admission: RE | Admit: 2022-10-31 | Discharge: 2022-10-31 | Disposition: A | Discharge status: Home / Self Care | Payer: PPO | Source: Ambulatory Visit | Attending: Oncology | Admitting: Oncology

## 2022-10-31 DIAGNOSIS — J439 Emphysema, unspecified: Secondary | ICD-10-CM | POA: Diagnosis not present

## 2022-10-31 DIAGNOSIS — C3490 Malignant neoplasm of unspecified part of unspecified bronchus or lung: Secondary | ICD-10-CM | POA: Diagnosis not present

## 2022-10-31 DIAGNOSIS — I700 Atherosclerosis of aorta: Secondary | ICD-10-CM | POA: Diagnosis not present

## 2022-10-31 DIAGNOSIS — R918 Other nonspecific abnormal finding of lung field: Secondary | ICD-10-CM | POA: Diagnosis not present

## 2022-10-31 LAB — GLUCOSE, CAPILLARY: Glucose-Capillary: 104 mg/dL — ABNORMAL HIGH (ref 70–99)

## 2022-10-31 MED ORDER — FLUDEOXYGLUCOSE F - 18 (FDG) INJECTION
7.5000 | Freq: Once | INTRAVENOUS | Status: AC | PRN
  Administered 2022-10-31: 7.75 via INTRAVENOUS

## 2022-11-07 DIAGNOSIS — K219 Gastro-esophageal reflux disease without esophagitis: Secondary | ICD-10-CM | POA: Diagnosis not present

## 2022-11-07 DIAGNOSIS — K860 Alcohol-induced chronic pancreatitis: Secondary | ICD-10-CM | POA: Diagnosis not present

## 2022-11-07 DIAGNOSIS — Z86010 Personal history of colonic polyps: Secondary | ICD-10-CM | POA: Diagnosis not present

## 2022-11-20 NOTE — Progress Notes (Deleted)
[address]       [place] [address]             [phone]                    [name] Medical Record #[id number] Date of Birth: 02/02/1951  Referring: Lynnea Ferrier, MD Primary Care: Lynnea Ferrier, MD Primary Cardiologist: None  Chief Complaint:   No chief complaint on file.   History of Present Illness:    Levada Schilling Sr. 72 y.o. male presents for surgical evaluation of a ***   Apical nodules of bilateral upper lobes  -Patient has larger lesion on right 1.7 cm and smaller on left upper lobe nodule both upper lobe locations  -there is underlying COPD with centrilobular and paraseptal emphysema, we may not biopsy both sides today due to increased risk of pneumothorax bilaterally  Smoking Hx: ***      Past Medical History:  Diagnosis Date   Arthritis pain    COPD (chronic obstructive pulmonary disease) (HCC)    Depression    GERD (gastroesophageal reflux disease)    Hemiparesis (HCC)    Hyperlipidemia    Pneumonia    Polio    age 37   PVD (peripheral vascular disease) (HCC)    Skin cancer    Status post placement of implantable loop recorder    Stroke [facility])     Past Surgical History:  Procedure Laterality Date   BRAIN TUMOR EXCISION     pt denies/states had a skin cancer removed from scalp   COLONOSCOPY  04/09/2011   Dr Mechele Collin   COLONOSCOPY WITH PROPOFOL N/A 05/12/2017   Procedure: COLONOSCOPY WITH PROPOFOL;  Surgeon: Scot Jun, MD;  Location: [facility] ENDOSCOPY;  Service: Endoscopy;  Laterality: N/A;   HERNIA REPAIR Right 03/28/2014   inguinal hernia repair   VIDEO BRONCHOSCOPY WITH ENDOBRONCHIAL ULTRASOUND N/A 10/07/2022   Procedure: VIDEO BRONCHOSCOPY WITH ENDOBRONCHIAL ULTRASOUND;  Surgeon: Vida Rigger, MD;  Location: [facility] ORS;  Service: Thoracic;  Laterality: N/A;    Family History  Problem Relation Age of Onset   Cancer Father        colon     Social History    Tobacco Use  Smoking Status Former   Current packs/day: 0.00   Average packs/day: 2.0 packs/day for 50.0 years (100.0 ttl pk-yrs)   Types: Cigarettes   Start date: 06/09/1965   Quit date: 06/10/2015   Years since quitting: 7.4  Smokeless Tobacco Never    Social History   Substance and Sexual Activity  Alcohol Use Not Currently     No Known Allergies  Current Outpatient Medications  Medication Sig Dispense Refill   aspirin EC 81 MG tablet Take 81 mg by mouth daily.     atorvastatin (LIPITOR) 80 MG tablet Take 80 mg by mouth daily.     FLUoxetine (PROZAC) 20 MG tablet Take 20 mg by mouth daily.     Multiple Vitamin (MULTIVITAMIN) tablet Take 1 tablet by mouth daily.     omeprazole (PRILOSEC) 20 MG capsule Take 20 mg by mouth daily.     No current facility-administered medications for this visit.    ROS   PHYSICAL EXAMINATION: There were no vitals taken for this visit. Physical Exam       I have independently reviewed the above radiology studies  and reviewed the findings with the patient.   Recent Lab  Findings: Lab Results  Component Value Date   WBC 8.3 09/30/2022   HGB 13.7 09/30/2022   HCT 40.4 09/30/2022   PLT 299 09/30/2022   GLUCOSE 72 09/30/2022   NA 139 09/30/2022   K 3.7 09/30/2022   CL 105 09/30/2022   CREATININE 0.85 09/30/2022   BUN 14 09/30/2022   CO2 24 09/30/2022   INR 1.1 10/07/2022    Diagnostic Studies & Laboratory data:     Recent Radiology Findings:   NM PET Image Initial (PI) Skull Base To Thigh  Result Date: 11/05/2022 CLINICAL DATA:  Initial treatment strategy for non-small cell lung cancer. EXAM: NUCLEAR MEDICINE PET SKULL BASE TO THIGH TECHNIQUE: 7.75 mCi F-18 FDG was injected intravenously. Full-ring PET imaging was performed from the skull base to thigh after the radiotracer. CT data was obtained and used for attenuation correction and anatomic localization. Fasting blood glucose: 104 mg/dl COMPARISON:  Chest CT 32/44/0102.   Abdominal CT 08/20/2017. FINDINGS: Mediastinal blood pool activity: SUV max 2.3 NECK: No hypermetabolic cervical lymph nodes are identified.Fairly symmetric activity within the lymphoid tissue of Waldeyer's ring is within physiologic limits. No suspicious activity identified within the pharyngeal mucosal space. Incidental CT findings: Bilateral carotid atherosclerosis. CHEST: There are no hypermetabolic mediastinal, hilar or axillary lymph nodes. Low level metabolic activity within the right hilum (SUV max 2.7), likely reactive. The part solid right upper lobe nodule measures approximately 1.9 x 1.1 cm on image 54/6 and demonstrates mild hypermetabolic activity with an SUV max of 2.5. Medial to this nodule, there is mild hypermetabolic activity associated with new ill-defined parenchymal opacity, likely related to previous bronchoscopy. The pure ground-glass nodule in the left upper lobe has an SUV max of 1.2 and has been stable on prior CTs. No other hypermetabolic pulmonary activity or suspicious pulmonary nodularity. Incidental CT findings: Atherosclerosis of the aorta, great vessels and coronary arteries. Mild centrilobular and paraseptal emphysema. ABDOMEN/PELVIS: There is no hypermetabolic activity within the liver, adrenal glands, spleen or pancreas. There is no hypermetabolic nodal activity in the abdomen or pelvis. Incidental CT findings: Unchanged lipid rich bilateral adrenal adenomas measuring 2.6 cm on the right and 2.0 cm on the left without associated hypermetabolic activity. Unchanged 5.3 cm simple cyst in the upper pole of the right kidney. No specific imaging follow-up imaging of the adrenal or renal lesions is recommended. Aortic and branch vessel atherosclerosis. Mild colonic diverticulosis. Punctate nonobstructing right renal calculus. SKELETON: There is no hypermetabolic activity to suggest osseous metastatic disease. Low level metabolic activity along the right aspect of the T12 vertebral body  (SUV max 3.4) appears to correspond with paraspinal osteophytes and is likely degenerative/reactive. Incidental CT findings: Multilevel spondylosis. IMPRESSION: 1. The part solid right upper lobe pulmonary nodule demonstrates mild hypermetabolic activity, consistent with known adenocarcinoma on recent biopsy. 2. The pure ground-glass nodule in the left upper lobe has been stable on prior CTS and demonstrates no significant hypermetabolic activity. This may reflect an adenocarcinoma precursor, and continued CT surveillance recommended. 3. No evidence of metastatic disease. 4. New ill-defined density medial to the right upper lobe lesion with low level metabolic activity attributed to the biopsy. 5. Stable bilateral lipid rich adrenal adenomas. 6. Multilevel thoracolumbar spondylosis. 7. Aortic Atherosclerosis (ICD10-I70.0) and Emphysema (ICD10-J43.9). Electronically Signed   By: Carey Bullocks M.D.   On: 11/05/2022 09:40     PFTs:  - FVC: ***% - FEV1: ***% -DLCO: ***%  IMPRESSION: 1. The part solid right upper lobe pulmonary nodule demonstrates mild hypermetabolic  activity, consistent with known adenocarcinoma on recent biopsy. 2. The pure ground-glass nodule in the left upper lobe has been stable on prior CTS and demonstrates no significant hypermetabolic activity. This may reflect an adenocarcinoma precursor, and continued CT surveillance recommended. 3. No evidence of metastatic disease. 4. New ill-defined density medial to the right upper lobe lesion with low level metabolic activity attributed to the biopsy. 5. Stable bilateral lipid rich adrenal adenomas. 6. Multilevel thoracolumbar spondylosis. 7. Aortic Atherosclerosis (ICD10-I70.0) and Emphysema (ICD10-J43.9).  Diagnosis Lung, biopsy, right upper lobe - ADENOCARCINOMA WITH LEPIDIC GROWTH.      Assessment / Plan:   72yo male with RUL adenocarcinoma, and LUL GGO.  Biopsy of the LUL was nondiagnostic, and it had minimal avidity  on PET CT.  He will need formal PFTs with a DLCO.  We will discuss his case in tumor board.       I  spent {CHL ONC TIME VISIT - IONGE:[id number]} with  the patient face to face in counseling and coordination of care.    Corliss Skains 11/20/2022 9:09 AM

## 2022-11-21 ENCOUNTER — Other Ambulatory Visit: Payer: Self-pay | Admitting: *Deleted

## 2022-11-21 DIAGNOSIS — C3411 Malignant neoplasm of upper lobe, right bronchus or lung: Secondary | ICD-10-CM

## 2022-11-21 NOTE — Progress Notes (Signed)
The proposed treatment discussed in conference is for discussion purpose only and is not a binding recommendation.  The patients have not been physically examined, or presented with their treatment options.  Therefore, final treatment plans cannot be decided.  

## 2022-11-22 ENCOUNTER — Encounter: Payer: PPO | Admitting: Thoracic Surgery (Cardiothoracic Vascular Surgery)

## 2022-12-04 DIAGNOSIS — E785 Hyperlipidemia, unspecified: Secondary | ICD-10-CM | POA: Diagnosis not present

## 2022-12-04 DIAGNOSIS — I739 Peripheral vascular disease, unspecified: Secondary | ICD-10-CM | POA: Diagnosis not present

## 2022-12-04 DIAGNOSIS — I700 Atherosclerosis of aorta: Secondary | ICD-10-CM | POA: Diagnosis not present

## 2022-12-04 DIAGNOSIS — R7303 Prediabetes: Secondary | ICD-10-CM | POA: Diagnosis not present

## 2022-12-12 ENCOUNTER — Ambulatory Visit: Payer: PPO | Attending: Oncology

## 2022-12-12 DIAGNOSIS — C3411 Malignant neoplasm of upper lobe, right bronchus or lung: Secondary | ICD-10-CM | POA: Insufficient documentation

## 2022-12-12 DIAGNOSIS — Z87891 Personal history of nicotine dependence: Secondary | ICD-10-CM | POA: Diagnosis not present

## 2022-12-12 LAB — PULMONARY FUNCTION TEST ARMC ONLY
DL/VA % pred: 64 %
DL/VA: 2.63 ml/min/mmHg/L
DLCO unc % pred: 71 %
DLCO unc: 16.12 ml/min/mmHg
FEF 25-75 Post: 1.8 L/s
FEF 25-75 Pre: 1.32 L/s
FEF2575-%Change-Post: 36 %
FEF2575-%Pred-Post: 91 %
FEF2575-%Pred-Pre: 67 %
FEV1-%Change-Post: 10 %
FEV1-%Pred-Post: 86 %
FEV1-%Pred-Pre: 78 %
FEV1-Post: 2.3 L
FEV1-Pre: 2.08 L
FEV1FVC-%Change-Post: 0 %
FEV1FVC-%Pred-Pre: 93 %
FEV6-%Change-Post: 11 %
FEV6-%Pred-Post: 95 %
FEV6-%Pred-Pre: 85 %
FEV6-Post: 3.28 L
FEV6-Pre: 2.93 L
FEV6FVC-%Change-Post: 0 %
FEV6FVC-%Pred-Post: 103 %
FEV6FVC-%Pred-Pre: 102 %
FVC-%Change-Post: 10 %
FVC-%Pred-Post: 92 %
FVC-%Pred-Pre: 83 %
FVC-Post: 3.39 L
FVC-Pre: 3.05 L
Post FEV1/FVC ratio: 68 %
Post FEV6/FVC ratio: 97 %
Pre FEV1/FVC ratio: 68 %
Pre FEV6/FVC Ratio: 96 %
RV % pred: 164 %
RV: 3.76 L
TLC % pred: 115 %
TLC: 7.17 L

## 2022-12-12 MED ORDER — ALBUTEROL SULFATE (2.5 MG/3ML) 0.083% IN NEBU
2.5000 mg | INHALATION_SOLUTION | Freq: Once | RESPIRATORY_TRACT | Status: AC
  Administered 2022-12-12: 2.5 mg via RESPIRATORY_TRACT

## 2022-12-12 NOTE — H&P (View-Only) (Signed)
[address]       [place] [address]             [phone]                    Jeremy Harrell [facility] Medical Record #[id number] Date of Birth: [birth year]  Referring: Creig Hines, MD Primary Care: Lynnea Ferrier, MD Primary Cardiologist: None  Chief Complaint:    Chief Complaint  Patient presents with   Lung Lesion    Chest CT 7/1, Bronch 7/1, PET 7/25, PFTs 9/5    History of Present Illness:    Jeremy Schilling Sr. 72 y.o. male presents for surgical evaluation of a right upper lobe NSCLC.  This was originally identified through the lung cancer screening program.  He was a life-long smoker, but quit in July of this year.  He denies any chest pain, shortness of breath, or neurologic symptoms    Past Medical History:  Diagnosis Date   Arthritis pain    COPD (chronic obstructive pulmonary disease) (HCC)    Depression    GERD (gastroesophageal reflux disease)    Hemiparesis (HCC)    Hyperlipidemia    Pneumonia    Polio    age 58   PVD (peripheral vascular disease) (HCC)    Skin cancer    Status post placement of implantable loop recorder    Stroke [facility])     Past Surgical History:  Procedure Laterality Date   BRAIN TUMOR EXCISION     pt denies/states had a skin cancer removed from scalp   COLONOSCOPY  04/09/2011   Dr Mechele Collin   COLONOSCOPY WITH PROPOFOL N/A 05/12/2017   Procedure: COLONOSCOPY WITH PROPOFOL;  Surgeon: Scot Jun, MD;  Location: [facility] ENDOSCOPY;  Service: Endoscopy;  Laterality: N/A;   HERNIA REPAIR Right 03/28/2014   inguinal hernia repair   VIDEO BRONCHOSCOPY WITH ENDOBRONCHIAL ULTRASOUND N/A 10/07/2022   Procedure: VIDEO BRONCHOSCOPY WITH ENDOBRONCHIAL ULTRASOUND;  Surgeon: Vida Rigger, MD;  Location: [facility] ORS;  Service: Thoracic;  Laterality: N/A;    Family History  Problem Relation Age of Onset   Cancer Father        colon     Social History   Tobacco Use  Smoking Status Former    Current packs/day: 0.00   Average packs/day: 2.0 packs/day for 50.0 years (100.0 ttl pk-yrs)   Types: Cigarettes   Start date: 06/09/1965   Quit date: 06/10/2015   Years since quitting: 7.5  Smokeless Tobacco Never    Social History   Substance and Sexual Activity  Alcohol Use Not Currently     No Known Allergies  Current Outpatient Medications  Medication Sig Dispense Refill   aspirin EC 81 MG tablet Take 81 mg by mouth daily.     atorvastatin (LIPITOR) 80 MG tablet Take 80 mg by mouth daily.     FLUoxetine (PROZAC) 20 MG tablet Take 20 mg by mouth daily.     Multiple Vitamin (MULTIVITAMIN) tablet Take 1 tablet by mouth daily.     omeprazole (PRILOSEC) 20 MG capsule Take 20 mg by mouth daily.     No current facility-administered medications for this visit.    Review of Systems  Constitutional:  Negative for malaise/fatigue and weight loss.  Respiratory:  Negative for cough and shortness of breath.   Cardiovascular:  Negative for chest pain.  Neurological: Negative.      PHYSICAL EXAMINATION: BP [name] Kitchen)  141/82 (BP Location: Left Arm, Patient Position: Sitting, Cuff Size: Normal)   Pulse 80   Resp 20   Ht 5\' 6"  (1.676 m)   Wt 145 lb 12.8 oz (66.1 kg)   SpO2 96% Comment: RA  BMI 23.53 kg/m  Physical Exam Constitutional:      General: He is not in acute distress.    Appearance: He is normal weight.  HENT:     Head: Normocephalic and atraumatic.  Eyes:     Extraocular Movements: Extraocular movements intact.  Cardiovascular:     Rate and Rhythm: Normal rate.  Pulmonary:     Effort: Pulmonary effort is normal. No respiratory distress.  Abdominal:     General: Abdomen is flat. There is no distension.  Musculoskeletal:        General: Normal range of motion.     Cervical back: Normal range of motion.  Skin:    General: Skin is warm and dry.  Neurological:     General: No focal deficit present.     Mental Status: He is alert and oriented to person, place, and  time.          I have independently reviewed the above radiology studies  and reviewed the findings with the patient.   Recent Lab Findings: Lab Results  Component Value Date   WBC 8.3 09/30/2022   HGB 13.7 09/30/2022   HCT 40.4 09/30/2022   PLT 299 09/30/2022   GLUCOSE 72 09/30/2022   NA 139 09/30/2022   K 3.7 09/30/2022   CL 105 09/30/2022   CREATININE 0.85 09/30/2022   BUN 14 09/30/2022   CO2 24 09/30/2022   INR 1.1 10/07/2022    Diagnostic Studies & Laboratory data:     Recent Radiology Findings:   No results found.   PFTs:  - FVC: 83% - FEV1: 78% -DLCO: 71%      Lung, biopsy, right upper lobe - ADENOCARCINOMA WITH LEPIDIC GROWTH. - SEE NOTE.  Assessment / Plan:   72yo male with biopsy proven adenocarcinoma of the right upper lobe.  He also has a similar appearing lesion in the left upper lobe.  We discussed his case in our tumor board, and options include right upper lobectomy, and surveillance vs biopsy of the left upper lobe and SBRT to both.  He would like to proceed with a resection of the right upper lobe cancer, and surveillance of the left upper lobe lesion.  Risks and benefits of right RATS, with right upper lobectomy have been discussed.  He is agreeable to proceed.     I  spent 40 minutes with  the patient face to face in counseling and coordination of care.    Jeremy Harrell 12/13/2022 12:37 PM

## 2022-12-12 NOTE — Progress Notes (Signed)
[address]       [place] [address]             [phone]                    [name] Medical Record #[id number] Date of Birth: 06-18-1950  Referring: Creig Hines, MD Primary Care: Lynnea Ferrier, MD Primary Cardiologist: None  Chief Complaint:    Chief Complaint  Patient presents with   Lung Lesion    Chest CT 7/1, Bronch 7/1, PET 7/25, PFTs 9/5    History of Present Illness:    Jeremy Schilling Sr. 72 y.o. male presents for surgical evaluation of a right upper lobe NSCLC.  This was originally identified through the lung cancer screening program.  He was a life-long smoker, but quit in July of this year.  He denies any chest pain, shortness of breath, or neurologic symptoms    Past Medical History:  Diagnosis Date   Arthritis pain    COPD (chronic obstructive pulmonary disease) (HCC)    Depression    GERD (gastroesophageal reflux disease)    Hemiparesis (HCC)    Hyperlipidemia    Pneumonia    Polio    age 65   PVD (peripheral vascular disease) (HCC)    Skin cancer    Status post placement of implantable loop recorder    Stroke [facility])     Past Surgical History:  Procedure Laterality Date   BRAIN TUMOR EXCISION     pt denies/states had a skin cancer removed from scalp   COLONOSCOPY  04/09/2011   Dr Mechele Collin   COLONOSCOPY WITH PROPOFOL N/A 05/12/2017   Procedure: COLONOSCOPY WITH PROPOFOL;  Surgeon: Scot Jun, MD;  Location: [facility] ENDOSCOPY;  Service: Endoscopy;  Laterality: N/A;   HERNIA REPAIR Right 03/28/2014   inguinal hernia repair   VIDEO BRONCHOSCOPY WITH ENDOBRONCHIAL ULTRASOUND N/A 10/07/2022   Procedure: VIDEO BRONCHOSCOPY WITH ENDOBRONCHIAL ULTRASOUND;  Surgeon: Vida Rigger, MD;  Location: [facility] ORS;  Service: Thoracic;  Laterality: N/A;    Family History  Problem Relation Age of Onset   Cancer Father        colon     Social History   Tobacco Use  Smoking Status Former    Current packs/day: 0.00   Average packs/day: 2.0 packs/day for 50.0 years (100.0 ttl pk-yrs)   Types: Cigarettes   Start date: 06/09/1965   Quit date: 06/10/2015   Years since quitting: 7.5  Smokeless Tobacco Never    Social History   Substance and Sexual Activity  Alcohol Use Not Currently     No Known Allergies  Current Outpatient Medications  Medication Sig Dispense Refill   aspirin EC 81 MG tablet Take 81 mg by mouth daily.     atorvastatin (LIPITOR) 80 MG tablet Take 80 mg by mouth daily.     FLUoxetine (PROZAC) 20 MG tablet Take 20 mg by mouth daily.     Multiple Vitamin (MULTIVITAMIN) tablet Take 1 tablet by mouth daily.     omeprazole (PRILOSEC) 20 MG capsule Take 20 mg by mouth daily.     No current facility-administered medications for this visit.    Review of Systems  Constitutional:  Negative for malaise/fatigue and weight loss.  Respiratory:  Negative for cough and shortness of breath.   Cardiovascular:  Negative for chest pain.  Neurological: Negative.      PHYSICAL EXAMINATION: BP [name] Kitchen)  141/82 (BP Location: Left Arm, Patient Position: Sitting, Cuff Size: Normal)   Pulse 80   Resp 20   Ht 5\' 6"  (1.676 m)   Wt 145 lb 12.8 oz (66.1 kg)   SpO2 96% Comment: RA  BMI 23.53 kg/m  Physical Exam Constitutional:      General: He is not in acute distress.    Appearance: He is normal weight.  HENT:     Head: Normocephalic and atraumatic.  Eyes:     Extraocular Movements: Extraocular movements intact.  Cardiovascular:     Rate and Rhythm: Normal rate.  Pulmonary:     Effort: Pulmonary effort is normal. No respiratory distress.  Abdominal:     General: Abdomen is flat. There is no distension.  Musculoskeletal:        General: Normal range of motion.     Cervical back: Normal range of motion.  Skin:    General: Skin is warm and dry.  Neurological:     General: No focal deficit present.     Mental Status: He is alert and oriented to person, place, and  time.          I have independently reviewed the above radiology studies  and reviewed the findings with the patient.   Recent Lab Findings: Lab Results  Component Value Date   WBC 8.3 09/30/2022   HGB 13.7 09/30/2022   HCT 40.4 09/30/2022   PLT 299 09/30/2022   GLUCOSE 72 09/30/2022   NA 139 09/30/2022   K 3.7 09/30/2022   CL 105 09/30/2022   CREATININE 0.85 09/30/2022   BUN 14 09/30/2022   CO2 24 09/30/2022   INR 1.1 10/07/2022    Diagnostic Studies & Laboratory data:     Recent Radiology Findings:   No results found.   PFTs:  - FVC: 83% - FEV1: 78% -DLCO: 71%      Lung, biopsy, right upper lobe - ADENOCARCINOMA WITH LEPIDIC GROWTH. - SEE NOTE.  Assessment / Plan:   72yo male with biopsy proven adenocarcinoma of the right upper lobe.  He also has a similar appearing lesion in the left upper lobe.  We discussed his case in our tumor board, and options include right upper lobectomy, and surveillance vs biopsy of the left upper lobe and SBRT to both.  He would like to proceed with a resection of the right upper lobe cancer, and surveillance of the left upper lobe lesion.  Risks and benefits of right RATS, with right upper lobectomy have been discussed.  He is agreeable to proceed.     I  spent 40 minutes with  the patient face to face in counseling and coordination of care.    Jeremy Harrell 12/13/2022 12:37 PM

## 2022-12-13 ENCOUNTER — Other Ambulatory Visit: Payer: Self-pay | Admitting: Thoracic Surgery (Cardiothoracic Vascular Surgery)

## 2022-12-13 ENCOUNTER — Other Ambulatory Visit: Payer: Self-pay | Admitting: *Deleted

## 2022-12-13 ENCOUNTER — Institutional Professional Consult (permissible substitution): Payer: PPO | Admitting: Thoracic Surgery (Cardiothoracic Vascular Surgery)

## 2022-12-13 ENCOUNTER — Encounter: Payer: Self-pay | Admitting: *Deleted

## 2022-12-13 VITALS — BP 141/82 | HR 80 | Resp 20 | Ht 66.0 in | Wt 145.8 lb

## 2022-12-13 DIAGNOSIS — C3411 Malignant neoplasm of upper lobe, right bronchus or lung: Secondary | ICD-10-CM

## 2022-12-13 NOTE — Addendum Note (Signed)
Addended by: Corliss Skains on: 12/13/2022 03:15 PM   Modules accepted: Orders

## 2022-12-17 ENCOUNTER — Telehealth (HOSPITAL_COMMUNITY): Payer: Self-pay | Admitting: *Deleted

## 2022-12-17 NOTE — Telephone Encounter (Signed)
Pt reached and given instructions for MPI 

## 2022-12-18 ENCOUNTER — Ambulatory Visit (HOSPITAL_COMMUNITY): Payer: PPO | Attending: Thoracic Surgery (Cardiothoracic Vascular Surgery)

## 2022-12-18 DIAGNOSIS — Z01810 Encounter for preprocedural cardiovascular examination: Secondary | ICD-10-CM | POA: Diagnosis not present

## 2022-12-18 DIAGNOSIS — C3411 Malignant neoplasm of upper lobe, right bronchus or lung: Secondary | ICD-10-CM | POA: Insufficient documentation

## 2022-12-18 LAB — MYOCARDIAL PERFUSION IMAGING
LV dias vol: 87 mL (ref 62–150)
LV sys vol: 34 mL
Nuc Stress EF: 61 %
Peak HR: 97 {beats}/min
Rest HR: 77 {beats}/min
Rest Nuclear Isotope Dose: 9.9 mCi
SDS: 0
SRS: 0
SSS: 0
ST Depression (mm): 0 mm
Stress Nuclear Isotope Dose: 32.5 mCi
TID: 0.99

## 2022-12-18 MED ORDER — REGADENOSON 0.4 MG/5ML IV SOLN
0.4000 mg | Freq: Once | INTRAVENOUS | Status: AC
Start: 2022-12-18 — End: 2022-12-18
  Administered 2022-12-18: 0.4 mg via INTRAVENOUS

## 2022-12-18 MED ORDER — TECHNETIUM TC 99M TETROFOSMIN IV KIT
9.9000 | PACK | Freq: Once | INTRAVENOUS | Status: AC | PRN
  Administered 2022-12-18: 9.9 via INTRAVENOUS

## 2022-12-18 MED ORDER — TECHNETIUM TC 99M TETROFOSMIN IV KIT
32.5000 | PACK | Freq: Once | INTRAVENOUS | Status: AC | PRN
  Administered 2022-12-18: 32.5 via INTRAVENOUS

## 2022-12-18 NOTE — Pre-Procedure Instructions (Signed)
Surgical Instructions   Your procedure is scheduled on December 23, 2022. Report to [facility] Main Entrance "A" at 11:30 A.M., then check in with the Admitting office. Any questions or running late day of surgery: call [phone]  Questions prior to your surgery date: call [phone], Monday-Friday, 8am-4pm. If you experience any cold or flu symptoms such as cough, fever, chills, shortness of breath, etc. between now and your scheduled surgery, please notify us at the above number.     Remember:  Do not eat or drink after midnight the night before your surgery     Take these medicines the morning of surgery with A SIP OF WATER: atorvastatin (LIPITOR)  FLUoxetine (PROZAC)  omeprazole (PRILOSEC)    Continue to take your Aspirin until the day before surgery. DO NOT take any the morning of surgery.   One week prior to surgery, STOP taking any Aleve, Naproxen, Ibuprofen, Motrin, Advil, Goody's, BC's, all herbal medications, fish oil, and non-prescription vitamins.                     Do NOT Smoke (Tobacco/Vaping) for 24 hours prior to your procedure.  If you use a CPAP at night, you may bring your mask/headgear for your overnight stay.   You will be asked to remove any contacts, glasses, piercing's, hearing aid's, dentures/partials prior to surgery. Please bring cases for these items if needed.    Patients discharged the day of surgery will not be allowed to drive home, and someone needs to stay with them for 24 hours.  SURGICAL WAITING ROOM VISITATION Patients may have no more than 2 support people in the waiting area - these visitors may rotate.   Pre-op nurse will coordinate an appropriate time for 1 ADULT support person, who may not rotate, to accompany patient in pre-op.  Children under the age of 35 must have an adult with them who is not the patient and must remain in the main waiting area with an adult.  If the patient needs to stay at the hospital during part of  their recovery, the visitor guidelines for inpatient rooms apply.  Please refer to the [facility] website for the visitor guidelines for any additional information.   If you received a COVID test during your pre-op visit  it is requested that you wear a mask when out in public, stay away from anyone that may not be feeling well and notify your surgeon if you develop symptoms. If you have been in contact with anyone that has tested positive in the last 10 days please notify you surgeon.      Pre-operative CHG Bathing Instructions   You can play a key role in reducing the risk of infection after surgery. Your skin needs to be as free of germs as possible. You can reduce the number of germs on your skin by washing with CHG (chlorhexidine gluconate) soap before surgery. CHG is an antiseptic soap that kills germs and continues to kill germs even after washing.   DO NOT use if you have an allergy to chlorhexidine/CHG or antibacterial soaps. If your skin becomes reddened or irritated, stop using the CHG and notify one of our RNs at [phone].              TAKE A SHOWER THE NIGHT BEFORE SURGERY AND THE DAY OF SURGERY    Please keep in mind the following:  DO NOT shave, including legs and underarms, 48 hours prior to surgery.  You may shave your face before/day of surgery.  Place clean sheets on your bed the night before surgery Use a clean washcloth (not used since being washed) for each shower. DO NOT sleep with pet's night before surgery.  CHG Shower Instructions:  If you choose to wash your hair and private area, wash first with your normal shampoo/soap.  After you use shampoo/soap, rinse your hair and body thoroughly to remove shampoo/soap residue.  Turn the water OFF and apply half the bottle of CHG soap to a CLEAN washcloth.  Apply CHG soap ONLY FROM YOUR NECK DOWN TO YOUR TOES (washing for 3-5 minutes)  DO NOT use CHG soap on face, private areas, open wounds, or sores.  Pay special  attention to the area where your surgery is being performed.  If you are having back surgery, having someone wash your back for you may be helpful. Wait 2 minutes after CHG soap is applied, then you may rinse off the CHG soap.  Pat dry with a clean towel  Put on clean pajamas    Additional instructions for the day of surgery: DO NOT APPLY any lotions, deodorants, cologne, or perfumes.   Do not wear jewelry or makeup Do not wear nail polish, gel polish, artificial nails, or any other type of covering on natural nails (fingers and toes) Do not bring valuables to the hospital. [facility] is not responsible for valuables/personal belongings. Put on clean/comfortable clothes.  Please brush your teeth.  Ask your nurse before applying any prescription medications to the skin.

## 2022-12-19 ENCOUNTER — Other Ambulatory Visit: Payer: Self-pay

## 2022-12-19 ENCOUNTER — Encounter (HOSPITAL_COMMUNITY)
Admission: RE | Admit: 2022-12-19 | Discharge: 2022-12-19 | Disposition: A | Discharge status: Home / Self Care | Payer: PPO | Source: Ambulatory Visit | Attending: Thoracic Surgery (Cardiothoracic Vascular Surgery) | Admitting: Thoracic Surgery (Cardiothoracic Vascular Surgery)

## 2022-12-19 ENCOUNTER — Encounter (HOSPITAL_COMMUNITY): Payer: Self-pay

## 2022-12-19 ENCOUNTER — Ambulatory Visit (HOSPITAL_COMMUNITY)
Admission: RE | Admit: 2022-12-19 | Discharge: 2022-12-19 | Disposition: A | Discharge status: Home / Self Care | Payer: PPO | Source: Ambulatory Visit | Attending: Thoracic Surgery (Cardiothoracic Vascular Surgery) | Admitting: Thoracic Surgery (Cardiothoracic Vascular Surgery)

## 2022-12-19 VITALS — BP 156/86 | HR 78 | Temp 98.2°F | Resp 17 | Ht 66.0 in | Wt 144.6 lb

## 2022-12-19 DIAGNOSIS — Z87891 Personal history of nicotine dependence: Secondary | ICD-10-CM | POA: Insufficient documentation

## 2022-12-19 DIAGNOSIS — I4510 Unspecified right bundle-branch block: Secondary | ICD-10-CM | POA: Diagnosis not present

## 2022-12-19 DIAGNOSIS — E785 Hyperlipidemia, unspecified: Secondary | ICD-10-CM | POA: Diagnosis not present

## 2022-12-19 DIAGNOSIS — Z9889 Other specified postprocedural states: Secondary | ICD-10-CM | POA: Diagnosis not present

## 2022-12-19 DIAGNOSIS — Z1152 Encounter for screening for COVID-19: Secondary | ICD-10-CM | POA: Insufficient documentation

## 2022-12-19 DIAGNOSIS — Z01818 Encounter for other preprocedural examination: Secondary | ICD-10-CM

## 2022-12-19 DIAGNOSIS — J449 Chronic obstructive pulmonary disease, unspecified: Secondary | ICD-10-CM | POA: Insufficient documentation

## 2022-12-19 DIAGNOSIS — Z01812 Encounter for preprocedural laboratory examination: Secondary | ICD-10-CM | POA: Diagnosis not present

## 2022-12-19 DIAGNOSIS — Z01810 Encounter for preprocedural cardiovascular examination: Secondary | ICD-10-CM | POA: Diagnosis not present

## 2022-12-19 DIAGNOSIS — K219 Gastro-esophageal reflux disease without esophagitis: Secondary | ICD-10-CM | POA: Diagnosis not present

## 2022-12-19 DIAGNOSIS — Z01811 Encounter for preprocedural respiratory examination: Secondary | ICD-10-CM | POA: Insufficient documentation

## 2022-12-19 DIAGNOSIS — I739 Peripheral vascular disease, unspecified: Secondary | ICD-10-CM | POA: Diagnosis not present

## 2022-12-19 DIAGNOSIS — Z79899 Other long term (current) drug therapy: Secondary | ICD-10-CM | POA: Diagnosis not present

## 2022-12-19 DIAGNOSIS — C3411 Malignant neoplasm of upper lobe, right bronchus or lung: Secondary | ICD-10-CM | POA: Insufficient documentation

## 2022-12-19 DIAGNOSIS — R9431 Abnormal electrocardiogram [ECG] [EKG]: Secondary | ICD-10-CM | POA: Insufficient documentation

## 2022-12-19 LAB — TYPE AND SCREEN
ABO/RH(D): A POS
Antibody Screen: NEGATIVE

## 2022-12-19 LAB — COMPREHENSIVE METABOLIC PANEL
ALT: 25 U/L (ref 0–44)
AST: 33 U/L (ref 15–41)
Albumin: 4.2 g/dL (ref 3.5–5.0)
Alkaline Phosphatase: 56 U/L (ref 38–126)
Anion gap: 10 (ref 5–15)
BUN: 11 mg/dL (ref 8–23)
CO2: 25 mmol/L (ref 22–32)
Calcium: 9.8 mg/dL (ref 8.9–10.3)
Chloride: 101 mmol/L (ref 98–111)
Creatinine, Ser: 0.98 mg/dL (ref 0.61–1.24)
GFR, Estimated: >60 mL/min (ref >60)
Glucose, Bld: 116 mg/dL — ABNORMAL HIGH (ref 70–99)
Potassium: 4.4 mmol/L (ref 3.5–5.1)
Sodium: 136 mmol/L (ref 135–145)
Total Bilirubin: 1.2 mg/dL (ref 0.3–1.2)
Total Protein: 7.7 g/dL (ref 6.5–8.1)

## 2022-12-19 LAB — CBC
HCT: 42.2 % (ref 39.0–52.0)
Hemoglobin: 13.7 g/dL (ref 13.0–17.0)
MCH: 30.6 pg (ref 26.0–34.0)
MCHC: 32.5 g/dL (ref 30.0–36.0)
MCV: 94.4 fL (ref 80.0–100.0)
Platelets: 305 10*3/uL (ref 150–400)
RBC: 4.47 MIL/uL (ref 4.22–5.81)
RDW: 12.8 % (ref 11.5–15.5)
WBC: 8.1 10*3/uL (ref 4.0–10.5)
nRBC: 0 % (ref 0.0–0.2)

## 2022-12-19 LAB — URINALYSIS, ROUTINE W REFLEX MICROSCOPIC
Bacteria, UA: NONE SEEN
Bilirubin Urine: NEGATIVE
Glucose, UA: NEGATIVE mg/dL
Ketones, ur: NEGATIVE mg/dL
Leukocytes,Ua: NEGATIVE
Nitrite: NEGATIVE
Protein, ur: NEGATIVE mg/dL
Specific Gravity, Urine: 1.006 (ref 1.005–1.030)
pH: 6 (ref 5.0–8.0)

## 2022-12-19 LAB — SURGICAL PCR SCREEN
MRSA, PCR: NEGATIVE
Staphylococcus aureus: NEGATIVE

## 2022-12-19 LAB — PROTIME-INR
INR: 1 (ref 0.8–1.2)
Prothrombin Time: 13.4 s (ref 11.4–15.2)

## 2022-12-19 LAB — APTT: aPTT: 25 s (ref 24–36)

## 2022-12-19 NOTE — Progress Notes (Signed)
PCP - Dr. Curtis Sites III Cardiologist - Denies  PPM/ICD - Denies Device Orders - n/a Rep Notified - n/a  Chest x-ray - 12/19/2022 EKG - 12/19/2022 Stress Test - 12/18/2022 ECHO - Denies Cardiac Cath - Denies  Sleep Study - Denies CPAP - n/a  No DM  Last dose of GLP1 agonist- n/a GLP1 instructions: n/a  Blood Thinner Instructions: n/a Aspirin Instructions: Pt will continue to take ASA through the day before surgery. He will not take any the morning of surgery.  NPO after midnight  COVID TEST- Yes. Result pending.    Anesthesia review: Yes. Abnormal EKG review. Pt completed stress test prior to surgery. Hx of CVA around 2018. Right sided weakness and some speech difficulty deficits.    Patient denies shortness of breath, fever, cough and chest pain at PAT appointment. Pt denies any respiratory illness/infection in the last two months.    All instructions explained to the patient and patients wife, with a verbal understanding of the material. Patient agrees to go over the instructions while at home for a better understanding. Patient also instructed to self quarantine after being tested for COVID-19. The opportunity to ask questions was provided.

## 2022-12-20 LAB — SARS CORONAVIRUS 2 (TAT 6-24 HRS): SARS Coronavirus 2: NEGATIVE

## 2022-12-20 NOTE — Anesthesia Preprocedure Evaluation (Signed)
Anesthesia Evaluation  Patient identified by MRN, date of birth, ID band Patient awake    Reviewed: Allergy & Precautions, NPO status , Patient's Chart, lab work & pertinent test results, reviewed documented beta blocker date and time   History of Anesthesia Complications Negative for: history of anesthetic complications  Airway Mallampati: II  TM Distance: >3 FB Neck ROM: Full    Dental  (+) Edentulous Upper, Edentulous Lower   Pulmonary neg shortness of breath, pneumonia, COPD, neg recent URI, Patient abstained from smoking., former smoker, neg PE   breath sounds clear to auscultation       Cardiovascular METS: 3 - Mets (-) hypertension+ Peripheral Vascular Disease  (-) CAD, (-) Past MI and (-) CHF (-) pacemaker Rhythm:Regular Rate:Normal     Neuro/Psych neg Seizures PSYCHIATRIC DISORDERS  Depression    CVA, Residual Symptoms    GI/Hepatic ,GERD  ,,(+) neg Cirrhosis        Endo/Other  neg diabetes    Renal/GU Renal disease     Musculoskeletal  (+) Arthritis ,    Abdominal   Peds  Hematology   Anesthesia Other Findings   Reproductive/Obstetrics                              Anesthesia Physical Anesthesia Plan  ASA: 3  Anesthesia Plan: General   Post-op Pain Management:    Induction: Intravenous  PONV Risk Score and Plan: 2 and Ondansetron and Dexamethasone  Airway Management Planned:   Additional Equipment: Arterial line  Intra-op Plan: Utilization of Controlled Hypotension per surrgeon request  Post-operative Plan: Extubation in OR  Informed Consent: I have reviewed the patients History and Physical, chart, labs and discussed the procedure including the risks, benefits and alternatives for the proposed anesthesia with the patient or authorized representative who has indicated his/her understanding and acceptance.     Dental advisory given  Plan Discussed with:  CRNA  Anesthesia Plan Comments: (PAT note written 12/20/2022 by Shonna Chock, PA-C.  )        Anesthesia Quick Evaluation

## 2022-12-20 NOTE — Progress Notes (Signed)
Anesthesia Chart Review:  Case: [id number] Date/Time: 12/23/22 1322   Procedure: XI ROBOTIC ASSISTED THORACOSCOPY-RIGHT UPPER LOBECTOMY (Right: Chest)   Anesthesia type: General   Pre-op diagnosis: LUNG CANCER   [facility] / [facility]   Surgeons: Corliss Skains, MD       DISCUSSION: Patient is a 72 year old male scheduled for the above procedure. RUL lung biopsy on 10/08/22 adenocarcinoma with lepidic growth.  History includes former smoker (quit 06/10/15), COPD, HLD, PVD, CVA (acute left ICA ischemic stroke 06/11/15, s/p tPA with thrombectomy of left ICA terminus occlusion with TICI 3 reperfusion; s/p ILR 06/19/15-11/16/18, had no atrial arrhythmias in > 3 years), polio (age 43 years), GERD, lung cancer (RUL 10/2022), skin cancer.   Dr. Cliffton Asters and a preoperative nuclear stress test.  12/18/2022 results were low risk, nonischemic, EF 55-65%.  12/19/2022 preoperative COVID-19 test was negative.  12/19/2022 chest x-ray is still in process.  Anesthesia team to evaluate on the day of surgery.  VS: BP (!) 156/86   Pulse 78   Temp 36.8 C   Resp 17   Ht 5\' 6"  (1.676 m)   Wt 65.6 kg   SpO2 100%   BMI 23.34 kg/m   PROVIDERS: Lynnea Ferrier, MD is PCP Vida Rigger, MD is pulmonologist Rolla Flatten) Owens Shark, MD is HEM-ONC AL-Khatib, Eloisa Northern, MD was EP cardiologist. As needed follow-up recommended after ILR at [facility].[name] Kitchen    LABS: Labs reviewed: Acceptable for surgery. (all labs ordered are listed, but only abnormal results are displayed)  Labs Reviewed  COMPREHENSIVE METABOLIC PANEL - Abnormal; Notable for the following components:      Result Value   Glucose, Bld 116 (*)    All other components within normal limits  URINALYSIS, ROUTINE W REFLEX MICROSCOPIC - Abnormal; Notable for the following components:   Hgb urine dipstick SMALL (*)    All other components within normal limits  SURGICAL PCR SCREEN  SARS CORONAVIRUS 2 (TAT 6-24 HRS)  CBC  PROTIME-INR  APTT  TYPE AND SCREEN     PFTs 12/12/22: FVC: 3.05 (83%), post 3.39 (92%) FEV1: 2.08 (78%), post 2.30 (86%) DLCO unc : 16.12 (71%)   IMAGES: CXR 12/19/22: In process.  PET Scan 10/31/22: IMPRESSION: 1. The part solid right upper lobe pulmonary nodule demonstrates mild hypermetabolic activity, consistent with known adenocarcinoma on recent biopsy. 2. The pure ground-glass nodule in the left upper lobe has been stable on prior CTS and demonstrates no significant hypermetabolic activity. This may reflect an adenocarcinoma precursor, and continued CT surveillance recommended. 3. No evidence of metastatic disease. 4. New ill-defined density medial to the right upper lobe lesion with low level metabolic activity attributed to the biopsy. 5. Stable bilateral lipid rich adrenal adenomas. 6. Multilevel thoracolumbar spondylosis. 7. Aortic Atherosclerosis (ICD10-I70.0) and Emphysema (ICD10-J43.9).   CT Chest Super D 10/07/22: IMPRESSION: 1. Irregular subsolid 2.4 cm anterior right upper lobe pulmonary nodule with 0.5 cm solid component, not appreciably changed from 07/05/2022 CT, increased from 1.1 x 0.8 cm pure ground-glass nodule on 02/25/2017 CT. Findings remain suspicious for low grade primary bronchogenic adenocarcinoma. Multidisciplinary thoracic oncology consultation suggested. 2. Ground-glass 1.7 cm left upper lobe nodule, stable from 07/05/2022 CT, increased from 0.5 cm on 02/25/2017 CT. Somewhat linear solid 0.6 cm medial left upper lobe nodule, stable from 07/05/2022 CT, slightly increased from 0.4 cm on 02/25/2017 CT. Continued noncontrast chest CT surveillance warranted for these nodules in 6-12 months. 3. No thoracic adenopathy. 4. One vessel coronary atherosclerosis.  5. Stable bilateral adrenal adenomas, for which no follow-up imaging is recommend. 6. Aortic Atherosclerosis (ICD10-I70.0) and Emphysema (ICD10-J43.9).   EKG: 12/19/22:  Normal sinus rhythm Right bundle branch block Abnormal  ECG When compared with ECG of 30-Sep-2022 14:30, No significant change was found Confirmed by Epifanio Lesches [phone]) on 12/19/2022 9:42:49 PM   CV: Nuclear stress test 12/18/22:   Findings are consistent with no ischemia and no infarction. The study is low risk.   No ST deviation was noted.   LV perfusion is abnormal. There is no evidence of ischemia. There is no evidence of infarction. Defect 1: There is a small defect with moderate reduction in uptake present in the apical inferior location(s) that is fixed. There is normal wall motion in the defect area. Consistent with artifact caused by subdiaphragmatic activity.   Left ventricular function is normal. Nuclear stress EF: 61%. The left ventricular ejection fraction is normal (55-65%). End diastolic cavity size is normal. End systolic cavity size is normal.   Cardiac MRI morphology 06/15/15 ([facility]): 1. The left ventricle is normal in cavity size and wall thickness. Global systolic function is normal. The LV ejection fraction is 65%. There are no regional wall motion abnormalities. 2. The right ventricle is normal in cavity size, wall thickness, and systolic function. 3. Both atria are normal in size. The atrial appendage is small. There is no thrombus seen in the LA or LAA (of note sensitivity of MRI in comparison to TEE for thrombus detection is not well evaluated). 4. The aortic valve is trileaflet with no aortic valve stenosis or regurgitation. There is no significant valvular disease noted.  5. Delayed enhancement imaging demonstrates no evidence of myocardial infarction, scar or infiltrative disease.  6. No LV thrombus seen. 7. Approx. 4 cm cyst-like lesion in right kidney seen.    Echo with bubble study 06/12/15 ([facility]): NORMAL LEFT VENTRICULAR SYSTOLIC FUNCTION WITH MILD LVH. NORMAL RIGHT VENTRICULAR SYSTOLIC FUNCTION. VALVULAR REGURGITATION: TRIVIAL TR. NO VALVULAR STENOSIS. NO PRIOR ECHO FOR COMPARISON NEGATIVE SALINE  CONTRAST STUDY   Past Medical History:  Diagnosis Date   Arthritis pain    COPD (chronic obstructive pulmonary disease) (HCC)    Depression    GERD (gastroesophageal reflux disease)    Hemiparesis (HCC)    Resolved as of 12/19/22   Hyperlipidemia    Lung cancer (HCC) 2024   Right Lobe   Pneumonia    Polio    age 20   PVD (peripheral vascular disease) (HCC)    Skin cancer    Head   Status post placement of implantable loop recorder    Stroke (HCC) 2018   speech difficulty deficits. mild right sided weakness    Past Surgical History:  Procedure Laterality Date   BRAIN TUMOR EXCISION     pt denies/states had a skin cancer removed from scalp   COLONOSCOPY  04/09/2011   Dr Mechele Collin   COLONOSCOPY WITH PROPOFOL N/A 05/12/2017   Procedure: COLONOSCOPY WITH PROPOFOL;  Surgeon: Scot Jun, MD;  Location: [facility] ENDOSCOPY;  Service: Endoscopy;  Laterality: N/A;   HERNIA REPAIR Right 03/28/2014   inguinal hernia repair   VIDEO BRONCHOSCOPY WITH ENDOBRONCHIAL ULTRASOUND N/A 10/07/2022   Procedure: VIDEO BRONCHOSCOPY WITH ENDOBRONCHIAL ULTRASOUND;  Surgeon: Vida Rigger, MD;  Location: [facility] ORS;  Service: Thoracic;  Laterality: N/A;    MEDICATIONS:  aspirin EC 81 MG tablet   atorvastatin (LIPITOR) 80 MG tablet   FLUoxetine (PROZAC) 40 MG capsule   Multiple Vitamin (MULTIVITAMIN)  tablet   omeprazole (PRILOSEC) 20 MG capsule   No current facility-administered medications for this encounter.    Shonna Chock, PA-C Surgical Short Stay/Anesthesiology [facility] Phone [phone] [facility] Phone [phone] 12/20/2022 9:55 AM

## 2022-12-23 ENCOUNTER — Inpatient Hospital Stay (HOSPITAL_COMMUNITY)
Admission: AD | Admit: 2022-12-23 | Discharge: 2023-01-06 | DRG: 164 | Disposition: A | Discharge status: Home / Self Care | Payer: PPO | Attending: Thoracic Surgery (Cardiothoracic Vascular Surgery) | Admitting: Thoracic Surgery (Cardiothoracic Vascular Surgery)

## 2022-12-23 ENCOUNTER — Other Ambulatory Visit: Payer: Self-pay

## 2022-12-23 ENCOUNTER — Inpatient Hospital Stay (HOSPITAL_COMMUNITY): Payer: PPO

## 2022-12-23 ENCOUNTER — Inpatient Hospital Stay (HOSPITAL_COMMUNITY): Payer: Self-pay

## 2022-12-23 ENCOUNTER — Inpatient Hospital Stay (HOSPITAL_COMMUNITY): Payer: Self-pay | Admitting: Vascular Surgery

## 2022-12-23 ENCOUNTER — Encounter (HOSPITAL_COMMUNITY)
Admission: AD | Disposition: A | Discharge status: Home / Self Care | Payer: Self-pay | Source: Home / Self Care | Attending: Thoracic Surgery (Cardiothoracic Vascular Surgery)

## 2022-12-23 ENCOUNTER — Encounter (HOSPITAL_COMMUNITY): Payer: Self-pay | Admitting: Thoracic Surgery (Cardiothoracic Vascular Surgery)

## 2022-12-23 DIAGNOSIS — R0602 Shortness of breath: Secondary | ICD-10-CM | POA: Diagnosis not present

## 2022-12-23 DIAGNOSIS — Z902 Acquired absence of lung [part of]: Secondary | ICD-10-CM

## 2022-12-23 DIAGNOSIS — Z85828 Personal history of other malignant neoplasm of skin: Secondary | ICD-10-CM | POA: Diagnosis not present

## 2022-12-23 DIAGNOSIS — R918 Other nonspecific abnormal finding of lung field: Secondary | ICD-10-CM | POA: Diagnosis present

## 2022-12-23 DIAGNOSIS — K219 Gastro-esophageal reflux disease without esophagitis: Secondary | ICD-10-CM | POA: Diagnosis present

## 2022-12-23 DIAGNOSIS — Z7982 Long term (current) use of aspirin: Secondary | ICD-10-CM | POA: Diagnosis not present

## 2022-12-23 DIAGNOSIS — Z8673 Personal history of transient ischemic attack (TIA), and cerebral infarction without residual deficits: Secondary | ICD-10-CM | POA: Diagnosis not present

## 2022-12-23 DIAGNOSIS — Z87891 Personal history of nicotine dependence: Secondary | ICD-10-CM

## 2022-12-23 DIAGNOSIS — C3411 Malignant neoplasm of upper lobe, right bronchus or lung: Principal | ICD-10-CM | POA: Diagnosis present

## 2022-12-23 DIAGNOSIS — R000 Tachycardia, unspecified: Secondary | ICD-10-CM | POA: Diagnosis not present

## 2022-12-23 DIAGNOSIS — J449 Chronic obstructive pulmonary disease, unspecified: Secondary | ICD-10-CM | POA: Diagnosis not present

## 2022-12-23 DIAGNOSIS — J939 Pneumothorax, unspecified: Secondary | ICD-10-CM | POA: Diagnosis not present

## 2022-12-23 DIAGNOSIS — T797XXA Traumatic subcutaneous emphysema, initial encounter: Secondary | ICD-10-CM | POA: Diagnosis not present

## 2022-12-23 DIAGNOSIS — J9382 Other air leak: Secondary | ICD-10-CM | POA: Diagnosis not present

## 2022-12-23 DIAGNOSIS — C3491 Malignant neoplasm of unspecified part of right bronchus or lung: Secondary | ICD-10-CM

## 2022-12-23 DIAGNOSIS — E785 Hyperlipidemia, unspecified: Secondary | ICD-10-CM | POA: Diagnosis present

## 2022-12-23 DIAGNOSIS — J9819 Other pulmonary collapse: Secondary | ICD-10-CM | POA: Diagnosis not present

## 2022-12-23 DIAGNOSIS — J439 Emphysema, unspecified: Secondary | ICD-10-CM | POA: Diagnosis not present

## 2022-12-23 DIAGNOSIS — Z79899 Other long term (current) drug therapy: Secondary | ICD-10-CM

## 2022-12-23 DIAGNOSIS — I739 Peripheral vascular disease, unspecified: Secondary | ICD-10-CM | POA: Diagnosis not present

## 2022-12-23 DIAGNOSIS — J90 Pleural effusion, not elsewhere classified: Secondary | ICD-10-CM | POA: Diagnosis not present

## 2022-12-23 DIAGNOSIS — R599 Enlarged lymph nodes, unspecified: Secondary | ICD-10-CM | POA: Diagnosis not present

## 2022-12-23 DIAGNOSIS — Z48813 Encounter for surgical aftercare following surgery on the respiratory system: Secondary | ICD-10-CM | POA: Diagnosis not present

## 2022-12-23 DIAGNOSIS — Z043 Encounter for examination and observation following other accident: Secondary | ICD-10-CM | POA: Diagnosis not present

## 2022-12-23 DIAGNOSIS — M25511 Pain in right shoulder: Secondary | ICD-10-CM | POA: Diagnosis not present

## 2022-12-23 DIAGNOSIS — J9811 Atelectasis: Secondary | ICD-10-CM | POA: Diagnosis not present

## 2022-12-23 DIAGNOSIS — Z4682 Encounter for fitting and adjustment of non-vascular catheter: Secondary | ICD-10-CM | POA: Diagnosis not present

## 2022-12-23 DIAGNOSIS — R0989 Other specified symptoms and signs involving the circulatory and respiratory systems: Secondary | ICD-10-CM | POA: Diagnosis not present

## 2022-12-23 LAB — ABO/RH: ABO/RH(D): A POS

## 2022-12-23 SURGERY — LOBECTOMY, LUNG, ROBOT-ASSISTED, USING VATS
Anesthesia: General | Site: Chest | Laterality: Right

## 2022-12-23 MED ORDER — OXYCODONE HCL 5 MG PO TABS: 5.0000 mg | ORAL_TABLET | Freq: Once | ORAL | Status: DC | PRN

## 2022-12-23 MED ORDER — IPRATROPIUM-ALBUTEROL 0.5-2.5 (3) MG/3ML IN SOLN
3.0000 mL | Freq: Four times a day (QID) | RESPIRATORY_TRACT | Status: DC
  Administered 2022-12-23 – 2022-12-24 (×2): 3 mL via RESPIRATORY_TRACT
  Filled 2022-12-23 (×2): qty 3

## 2022-12-23 MED ORDER — ATORVASTATIN CALCIUM 80 MG PO TABS
80.0000 mg | ORAL_TABLET | Freq: Every day | ORAL | Status: DC
  Administered 2022-12-24 – 2023-01-06 (×14): 80 mg via ORAL
  Filled 2022-12-23 (×14): qty 1

## 2022-12-23 MED ORDER — 0.9 % SODIUM CHLORIDE (POUR BTL) OPTIME
TOPICAL | Status: DC | PRN
  Administered 2022-12-23: 2000 mL

## 2022-12-23 MED ORDER — ROCURONIUM BROMIDE 10 MG/ML (PF) SYRINGE
PREFILLED_SYRINGE | INTRAVENOUS | Status: AC
  Filled 2022-12-23: qty 20

## 2022-12-23 MED ORDER — FENTANYL CITRATE (PF) 250 MCG/5ML IJ SOLN
INTRAMUSCULAR | Status: DC | PRN
  Administered 2022-12-23: 100 ug via INTRAVENOUS
  Administered 2022-12-23 (×2): 50 ug via INTRAVENOUS
  Administered 2022-12-23: 100 ug via INTRAVENOUS

## 2022-12-23 MED ORDER — ROCURONIUM BROMIDE 10 MG/ML (PF) SYRINGE
PREFILLED_SYRINGE | INTRAVENOUS | Status: AC
  Filled 2022-12-23: qty 10

## 2022-12-23 MED ORDER — LACTATED RINGERS IV SOLN: INTRAVENOUS | Status: DC

## 2022-12-23 MED ORDER — ACETAMINOPHEN 10 MG/ML IV SOLN: 1000.0000 mg | Freq: Once | INTRAVENOUS | Status: DC | PRN

## 2022-12-23 MED ORDER — ONDANSETRON HCL 4 MG/2ML IJ SOLN
INTRAMUSCULAR | Status: DC | PRN
  Administered 2022-12-23: 4 mg via INTRAVENOUS

## 2022-12-23 MED ORDER — PHENYLEPHRINE HCL-NACL 20-0.9 MG/250ML-% IV SOLN
INTRAVENOUS | Status: DC | PRN
  Administered 2022-12-23: 10 ug/min via INTRAVENOUS

## 2022-12-23 MED ORDER — OXYCODONE HCL 5 MG/5ML PO SOLN: 5.0000 mg | Freq: Once | ORAL | Status: DC | PRN

## 2022-12-23 MED ORDER — ONDANSETRON HCL 4 MG/2ML IJ SOLN: 4.0000 mg | Freq: Once | INTRAMUSCULAR | Status: DC | PRN

## 2022-12-23 MED ORDER — LIDOCAINE 2% (20 MG/ML) 5 ML SYRINGE
INTRAMUSCULAR | Status: DC | PRN
  Administered 2022-12-23: 100 mg via INTRAVENOUS

## 2022-12-23 MED ORDER — HYDROMORPHONE HCL 1 MG/ML IJ SOLN
INTRAMUSCULAR | Status: AC
  Filled 2022-12-23: qty 0.5

## 2022-12-23 MED ORDER — SUGAMMADEX SODIUM 200 MG/2ML IV SOLN
INTRAVENOUS | Status: DC | PRN
  Administered 2022-12-23: 200 mg via INTRAVENOUS

## 2022-12-23 MED ORDER — ASPIRIN 81 MG PO TBEC
81.0000 mg | DELAYED_RELEASE_TABLET | Freq: Every day | ORAL | Status: DC
  Administered 2022-12-24 – 2023-01-06 (×14): 81 mg via ORAL
  Filled 2022-12-23 (×14): qty 1

## 2022-12-23 MED ORDER — PANTOPRAZOLE SODIUM 40 MG PO TBEC
40.0000 mg | DELAYED_RELEASE_TABLET | Freq: Every day | ORAL | Status: DC
  Administered 2022-12-24 – 2023-01-06 (×14): 40 mg via ORAL
  Filled 2022-12-23 (×14): qty 1

## 2022-12-23 MED ORDER — MIDAZOLAM HCL 2 MG/2ML IJ SOLN
INTRAMUSCULAR | Status: DC | PRN
  Administered 2022-12-23: 2 mg via INTRAVENOUS

## 2022-12-23 MED ORDER — MIDAZOLAM HCL 2 MG/2ML IJ SOLN
INTRAMUSCULAR | Status: AC
  Filled 2022-12-23: qty 2

## 2022-12-23 MED ORDER — FENTANYL CITRATE (PF) 100 MCG/2ML IJ SOLN: 25.0000 ug | INTRAMUSCULAR | Status: DC | PRN

## 2022-12-23 MED ORDER — DEXAMETHASONE SODIUM PHOSPHATE 10 MG/ML IJ SOLN
INTRAMUSCULAR | Status: DC | PRN
  Administered 2022-12-23: 20 mg via INTRAVENOUS

## 2022-12-23 MED ORDER — BISACODYL 5 MG PO TBEC
10.0000 mg | DELAYED_RELEASE_TABLET | Freq: Every day | ORAL | Status: DC
  Administered 2022-12-24 – 2023-01-06 (×13): 10 mg via ORAL
  Filled 2022-12-23 (×14): qty 2

## 2022-12-23 MED ORDER — HEMOSTATIC AGENTS (NO CHARGE) OPTIME
TOPICAL | Status: DC | PRN
Start: 2022-12-23 — End: 2022-12-23
  Administered 2022-12-23: 2 via TOPICAL

## 2022-12-23 MED ORDER — ACETAMINOPHEN 500 MG PO TABS
1000.0000 mg | ORAL_TABLET | Freq: Four times a day (QID) | ORAL | Status: AC
  Administered 2022-12-24 – 2022-12-28 (×17): 1000 mg via ORAL
  Filled 2022-12-23 (×18): qty 2

## 2022-12-23 MED ORDER — ADULT MULTIVITAMIN W/MINERALS CH
1.0000 | ORAL_TABLET | Freq: Every day | ORAL | Status: DC
  Administered 2022-12-24 – 2023-01-06 (×14): 1 via ORAL
  Filled 2022-12-23 (×14): qty 1

## 2022-12-23 MED ORDER — ACETAMINOPHEN 160 MG/5ML PO SOLN: 1000.0000 mg | Freq: Four times a day (QID) | ORAL | Status: AC

## 2022-12-23 MED ORDER — ESMOLOL HCL 100 MG/10ML IV SOLN
INTRAVENOUS | Status: AC
  Filled 2022-12-23: qty 10

## 2022-12-23 MED ORDER — SENNOSIDES-DOCUSATE SODIUM 8.6-50 MG PO TABS
1.0000 | ORAL_TABLET | Freq: Every day | ORAL | Status: DC
  Administered 2022-12-23 – 2023-01-05 (×9): 1 via ORAL
  Filled 2022-12-23 (×11): qty 1

## 2022-12-23 MED ORDER — PHENYLEPHRINE 80 MCG/ML (10ML) SYRINGE FOR IV PUSH (FOR BLOOD PRESSURE SUPPORT)
PREFILLED_SYRINGE | INTRAVENOUS | Status: DC | PRN
  Administered 2022-12-23: 80 ug via INTRAVENOUS
  Administered 2022-12-23: 120 ug via INTRAVENOUS
  Administered 2022-12-23 (×3): 80 ug via INTRAVENOUS

## 2022-12-23 MED ORDER — EPHEDRINE SULFATE-NACL 50-0.9 MG/10ML-% IV SOSY
PREFILLED_SYRINGE | INTRAVENOUS | Status: DC | PRN
  Administered 2022-12-23: 5 mg via INTRAVENOUS

## 2022-12-23 MED ORDER — BUPIVACAINE HCL (PF) 0.5 % IJ SOLN
INTRAMUSCULAR | Status: AC
  Filled 2022-12-23: qty 30

## 2022-12-23 MED ORDER — SODIUM CHLORIDE FLUSH 0.9 % IV SOLN
INTRAVENOUS | Status: DC | PRN
  Administered 2022-12-23: 100 mL

## 2022-12-23 MED ORDER — HYDROMORPHONE HCL 1 MG/ML IJ SOLN
INTRAMUSCULAR | Status: DC | PRN
  Administered 2022-12-23: .8 mg via INTRAVENOUS
  Administered 2022-12-23: .2 mg via INTRAVENOUS

## 2022-12-23 MED ORDER — LIDOCAINE 2% (20 MG/ML) 5 ML SYRINGE
INTRAMUSCULAR | Status: AC
  Filled 2022-12-23: qty 5

## 2022-12-23 MED ORDER — FENTANYL CITRATE (PF) 250 MCG/5ML IJ SOLN
INTRAMUSCULAR | Status: AC
  Filled 2022-12-23: qty 5

## 2022-12-23 MED ORDER — ESMOLOL HCL 100 MG/10ML IV SOLN
INTRAVENOUS | Status: DC | PRN
Start: 2022-12-23 — End: 2022-12-23
  Administered 2022-12-23: 30 mg via INTRAVENOUS
  Administered 2022-12-23: 20 mg via INTRAVENOUS
  Administered 2022-12-23: 50 mg via INTRAVENOUS

## 2022-12-23 MED ORDER — CEFAZOLIN SODIUM-DEXTROSE 2-4 GM/100ML-% IV SOLN
2.0000 g | Freq: Three times a day (TID) | INTRAVENOUS | Status: AC
  Administered 2022-12-23 – 2022-12-24 (×2): 2 g via INTRAVENOUS
  Filled 2022-12-23 (×2): qty 100

## 2022-12-23 MED ORDER — CEFAZOLIN SODIUM-DEXTROSE 2-4 GM/100ML-% IV SOLN
2.0000 g | INTRAVENOUS | Status: AC
  Administered 2022-12-23: 2 g via INTRAVENOUS
  Filled 2022-12-23: qty 100

## 2022-12-23 MED ORDER — PHENYLEPHRINE 80 MCG/ML (10ML) SYRINGE FOR IV PUSH (FOR BLOOD PRESSURE SUPPORT)
PREFILLED_SYRINGE | INTRAVENOUS | Status: AC
  Filled 2022-12-23: qty 10

## 2022-12-23 MED ORDER — ORAL CARE MOUTH RINSE: 15.0000 mL | Freq: Once | OROMUCOSAL | Status: AC

## 2022-12-23 MED ORDER — CHLORHEXIDINE GLUCONATE 0.12 % MT SOLN
OROMUCOSAL | Status: AC
  Administered 2022-12-23: 15 mL via OROMUCOSAL
  Filled 2022-12-23: qty 15

## 2022-12-23 MED ORDER — LACTATED RINGERS IV SOLN
INTRAVENOUS | Status: DC | PRN
Start: 2022-12-23 — End: 2022-12-23

## 2022-12-23 MED ORDER — ROCURONIUM BROMIDE 10 MG/ML (PF) SYRINGE
PREFILLED_SYRINGE | INTRAVENOUS | Status: DC | PRN
  Administered 2022-12-23: 20 mg via INTRAVENOUS
  Administered 2022-12-23: 40 mg via INTRAVENOUS
  Administered 2022-12-23: 100 mg via INTRAVENOUS
  Administered 2022-12-23: 20 mg via INTRAVENOUS

## 2022-12-23 MED ORDER — ONDANSETRON HCL 4 MG/2ML IJ SOLN: 4.0000 mg | Freq: Four times a day (QID) | INTRAMUSCULAR | Status: DC | PRN

## 2022-12-23 MED ORDER — LABETALOL HCL 5 MG/ML IV SOLN
INTRAVENOUS | Status: AC
  Filled 2022-12-23: qty 4

## 2022-12-23 MED ORDER — KETOROLAC TROMETHAMINE 15 MG/ML IJ SOLN
15.0000 mg | Freq: Four times a day (QID) | INTRAMUSCULAR | Status: AC
  Administered 2022-12-24 – 2022-12-25 (×8): 15 mg via INTRAVENOUS
  Filled 2022-12-23 (×10): qty 1

## 2022-12-23 MED ORDER — OXYCODONE HCL 5 MG PO TABS
5.0000 mg | ORAL_TABLET | ORAL | Status: DC | PRN
  Administered 2022-12-24 – 2023-01-06 (×27): 10 mg via ORAL
  Filled 2022-12-23 (×28): qty 2

## 2022-12-23 MED ORDER — FLUOXETINE HCL 20 MG PO CAPS
40.0000 mg | ORAL_CAPSULE | Freq: Every day | ORAL | Status: DC
  Administered 2022-12-24 – 2023-01-06 (×14): 40 mg via ORAL
  Filled 2022-12-23 (×14): qty 2

## 2022-12-23 MED ORDER — DEXAMETHASONE SODIUM PHOSPHATE 10 MG/ML IJ SOLN
INTRAMUSCULAR | Status: AC
  Filled 2022-12-23: qty 1

## 2022-12-23 MED ORDER — CHLORHEXIDINE GLUCONATE 0.12 % MT SOLN: 15.0000 mL | Freq: Once | OROMUCOSAL | Status: AC

## 2022-12-23 MED ORDER — TRAMADOL HCL 50 MG PO TABS
50.0000 mg | ORAL_TABLET | Freq: Four times a day (QID) | ORAL | Status: DC | PRN
  Administered 2022-12-23 – 2022-12-24 (×2): 100 mg via ORAL
  Administered 2022-12-24 – 2022-12-25 (×2): 50 mg via ORAL
  Administered 2022-12-25: 100 mg via ORAL
  Administered 2022-12-26 (×2): 50 mg via ORAL
  Administered 2022-12-27 – 2023-01-04 (×9): 100 mg via ORAL
  Filled 2022-12-23: qty 1
  Filled 2022-12-23: qty 2
  Filled 2022-12-23 (×2): qty 1
  Filled 2022-12-23 (×12): qty 2
  Filled 2022-12-23: qty 1

## 2022-12-23 MED ORDER — EPHEDRINE 5 MG/ML INJ
INTRAVENOUS | Status: AC
  Filled 2022-12-23: qty 5

## 2022-12-23 MED ORDER — LABETALOL HCL 5 MG/ML IV SOLN
INTRAVENOUS | Status: DC | PRN
  Administered 2022-12-23 (×2): 5 mg via INTRAVENOUS

## 2022-12-23 MED ORDER — PROPOFOL 10 MG/ML IV BOLUS
INTRAVENOUS | Status: DC | PRN
  Administered 2022-12-23: 50 mg via INTRAVENOUS
  Administered 2022-12-23: 100 mg via INTRAVENOUS

## 2022-12-23 MED ORDER — ENOXAPARIN SODIUM 40 MG/0.4ML IJ SOSY
40.0000 mg | PREFILLED_SYRINGE | INTRAMUSCULAR | Status: DC
  Administered 2022-12-24 – 2023-01-06 (×14): 40 mg via SUBCUTANEOUS
  Filled 2022-12-23 (×14): qty 0.4

## 2022-12-23 MED ORDER — BUPIVACAINE LIPOSOME 1.3 % IJ SUSP
INTRAMUSCULAR | Status: AC
  Filled 2022-12-23: qty 20

## 2022-12-23 SURGICAL SUPPLY — 99 items
ADH SKN CLS APL DERMABOND .7 (GAUZE/BANDAGES/DRESSINGS) ×1
APL PRP STRL LF DISP 70% ISPRP (MISCELLANEOUS) ×1
BAG SPEC RTRVL C1550 15 (MISCELLANEOUS) ×1
BLADE CLIPPER SURG (BLADE) ×1 IMPLANT
BLADE SURG 11 STRL SS (BLADE) ×1 IMPLANT
CANISTER SUCT 3000ML PPV (MISCELLANEOUS) ×2 IMPLANT
CANNULA REDUCER 12-8 DVNC XI (CANNULA) ×2 IMPLANT
CATH THORACIC 28FR (CATHETERS) ×1 IMPLANT
CHLORAPREP W/TINT 26 (MISCELLANEOUS) ×1 IMPLANT
CLIP TI MEDIUM 6 (CLIP) IMPLANT
CNTNR URN SCR LID CUP LEK RST (MISCELLANEOUS) ×5 IMPLANT
CONN ST 1/4X3/8 BEN (MISCELLANEOUS) IMPLANT
CONT SPEC 4OZ STRL OR WHT (MISCELLANEOUS) ×6
DEFOGGER SCOPE WARMER CLEARIFY (MISCELLANEOUS) ×1 IMPLANT
DERMABOND ADVANCED .7 DNX12 (GAUZE/BANDAGES/DRESSINGS) ×1 IMPLANT
DRAIN CHANNEL 28F RND 3/8 FF (WOUND CARE) IMPLANT
DRAPE ARM DVNC X/XI (DISPOSABLE) ×4 IMPLANT
DRAPE COLUMN DVNC XI (DISPOSABLE) ×1 IMPLANT
DRAPE CV SPLIT W-CLR ANES SCRN (DRAPES) ×1 IMPLANT
DRAPE ORTHO SPLIT 77X108 STRL (DRAPES) ×1
DRAPE SURG ORHT 6 SPLT 77X108 (DRAPES) ×1 IMPLANT
ELECT BLADE 6.5 EXT (BLADE) IMPLANT
ELECT REM PT RETURN 9FT ADLT (ELECTROSURGICAL) ×1
ELECTRODE REM PT RTRN 9FT ADLT (ELECTROSURGICAL) ×1 IMPLANT
FORCEPS BPLR LNG DVNC XI (INSTRUMENTS) IMPLANT
FORCEPS CADIERE DVNC XI (FORCEP) IMPLANT
GAUZE KITTNER 4X8 (MISCELLANEOUS) ×1 IMPLANT
GAUZE SPONGE 4X4 12PLY STRL (GAUZE/BANDAGES/DRESSINGS) ×1 IMPLANT
GLOVE BIO SURGEON STRL SZ 6.5 (GLOVE) IMPLANT
GLOVE BIO SURGEON STRL SZ7.5 (GLOVE) ×2 IMPLANT
GLOVE BIOGEL PI IND STRL 7.0 (GLOVE) IMPLANT
GLOVE SS BIOGEL STRL SZ 7 (GLOVE) IMPLANT
GLOVE SS BIOGEL STRL SZ 7.5 (GLOVE) IMPLANT
GLOVE SURG POLYISO LF SZ8 (GLOVE) ×1 IMPLANT
GOWN STRL REUS W/ TWL LRG LVL3 (GOWN DISPOSABLE) ×2 IMPLANT
GOWN STRL REUS W/ TWL XL LVL3 (GOWN DISPOSABLE) ×2 IMPLANT
GOWN STRL REUS W/TWL 2XL LVL3 (GOWN DISPOSABLE) ×1 IMPLANT
GOWN STRL REUS W/TWL LRG LVL3 (GOWN DISPOSABLE) ×2
GOWN STRL REUS W/TWL XL LVL3 (GOWN DISPOSABLE) ×4
GRASPER TIP-UP FEN DVNC XI (INSTRUMENTS) IMPLANT
HEMOSTAT SURGICEL 2X14 (HEMOSTASIS) ×1 IMPLANT
IRRIGATION STRYKERFLOW (MISCELLANEOUS) IMPLANT
IRRIGATOR STRYKERFLOW (MISCELLANEOUS)
IRRIGATOR SUCT 8 DISP DVNC XI (IRRIGATION / IRRIGATOR) IMPLANT
KIT BASIN OR (CUSTOM PROCEDURE TRAY) ×1 IMPLANT
KIT TURNOVER KIT B (KITS) ×1 IMPLANT
NDL 22X1.5 STRL (OR ONLY) (MISCELLANEOUS) ×1 IMPLANT
NEEDLE 22X1.5 STRL (OR ONLY) (MISCELLANEOUS) ×1
NS IRRIG 1000ML POUR BTL (IV SOLUTION) ×3 IMPLANT
PACK CHEST (CUSTOM PROCEDURE TRAY) ×1 IMPLANT
PAD ARMBOARD 7.5X6 YLW CONV (MISCELLANEOUS) ×5 IMPLANT
RELOAD STAPLE 45 2.5 WHT DVNC (STAPLE) IMPLANT
RELOAD STAPLE 45 3.5 BLU DVNC (STAPLE) IMPLANT
RELOAD STAPLE 45 4.6 BLK DVNC (STAPLE) IMPLANT
SCISSORS LAP 5X35 DISP (ENDOMECHANICALS) IMPLANT
SEAL UNIV 5-12 XI (MISCELLANEOUS) ×4 IMPLANT
SEALANT PROGEL (MISCELLANEOUS) IMPLANT
SEALER LIGASURE MARYLAND 30 (ELECTROSURGICAL) IMPLANT
SET TRI-LUMEN FLTR TB AIRSEAL (TUBING) ×1 IMPLANT
SOL ELECTROSURG ANTI STICK (MISCELLANEOUS)
SOLUTION ELECTROSURG ANTI STCK (MISCELLANEOUS) IMPLANT
SPONGE INTESTINAL PEANUT (DISPOSABLE) IMPLANT
SPONGE TONSIL 1 RF SGL (DISPOSABLE) IMPLANT
STAPLER 45 SUREFORM CVD DVNC (STAPLE) IMPLANT
STAPLER 45 SUREFORM DVNC (STAPLE) IMPLANT
STAPLER RELOAD 2.5X45 WHT DVNC (STAPLE) ×4
STAPLER RELOAD 3.5X45 BLU DVNC (STAPLE) ×9
STAPLER RELOAD 45 4.6 BLK DVNC (STAPLE) ×3
STOPCOCK 4 WAY LG BORE MALE ST (IV SETS) ×1 IMPLANT
SUT MNCRL AB 3-0 PS2 18 (SUTURE) IMPLANT
SUT MON AB 2-0 CT1 36 (SUTURE) IMPLANT
SUT PDS AB 1 CTX 36 (SUTURE) IMPLANT
SUT PROLENE 4 0 RB 1 (SUTURE)
SUT PROLENE 4-0 RB1 .5 CRCL 36 (SUTURE) IMPLANT
SUT SILK 1 MH (SUTURE) ×1 IMPLANT
SUT SILK 1 TIES 10X30 (SUTURE) IMPLANT
SUT SILK 2 0 SH (SUTURE) IMPLANT
SUT SILK 2 0SH CR/8 30 (SUTURE) IMPLANT
SUT VIC AB 1 CTX 36 (SUTURE)
SUT VIC AB 1 CTX36XBRD ANBCTR (SUTURE) IMPLANT
SUT VIC AB 2-0 CT1 27 (SUTURE) ×1
SUT VIC AB 2-0 CT1 TAPERPNT 27 (SUTURE) ×1 IMPLANT
SUT VIC AB 3-0 SH 27 (SUTURE) ×2
SUT VIC AB 3-0 SH 27X BRD (SUTURE) ×2 IMPLANT
SUT VICRYL 0 TIES 12 18 (SUTURE) ×1 IMPLANT
SUT VICRYL 0 UR6 27IN ABS (SUTURE) ×2 IMPLANT
SUT VICRYL 2 TP 1 (SUTURE) IMPLANT
SYR 10ML LL (SYRINGE) ×1 IMPLANT
SYR 20ML LL LF (SYRINGE) ×1 IMPLANT
SYR 50ML LL SCALE MARK (SYRINGE) ×1 IMPLANT
SYSTEM RETRIEVAL ANCHOR 15 (MISCELLANEOUS) IMPLANT
SYSTEM SAHARA CHEST DRAIN ATS (WOUND CARE) ×1 IMPLANT
TAPE CLOTH 4X10 WHT NS (GAUZE/BANDAGES/DRESSINGS) ×1 IMPLANT
TAPE CLOTH SURG 4X10 WHT LF (GAUZE/BANDAGES/DRESSINGS) IMPLANT
TIP APPLICATOR SPRAY EXTEND 16 (VASCULAR PRODUCTS) IMPLANT
TOWEL GREEN STERILE (TOWEL DISPOSABLE) ×1 IMPLANT
TRAY FOLEY MTR SLVR 16FR STAT (SET/KITS/TRAYS/PACK) ×1 IMPLANT
TUBING EXTENTION W/L.L. (IV SETS) ×1 IMPLANT
WATER STERILE IRR 1000ML POUR (IV SOLUTION) IMPLANT

## 2022-12-23 NOTE — Hospital Course (Addendum)
History of Present Illness:    Mr. Jeremy Harrell is a 72 year old male with history of longstanding smoking but quit in July of this year. He underwent a CT lung cancer screening program in March 2024 which showed mixed attenuation right upper lobe lung nodule with solid components. Left upper lobe lung nodule is linear and may represent progressive scarring. This was followed by a CT chest and a CT super D chest in June 2024. CT showed no evidence of axillary mediastinal or hilar adenopathy. Mild paraseptal emphysema. Irregular subsolid 2.4 x 1.7 cm anterior right upper lobe lung nodule with a 0.5 cm solid component not significantly changed since March 2024 but has increased since 2018. There were also 1.7 cm left upper lobe groundglass opacities and another linear solid 0.6 cm left upper lobe lung nodule. These nodules were deemed to be okay with monitoring every 6 to 12 months but biopsy was recommended for the right upper lobe lung nodule. Patient had a bronchoscopy guided biopsy which showed adenocarcinoma with lepidic growth. He denies any chest pain, shortness of breath, or neurologic symptoms.  Dr. Cliffton Asters reviewed the patient's diagnostic studies and determined he would benefit from surgical intervention. He reviewed the treatment options as well as the risks and benefits of surgery. Mr. Jeremy Sr. Was agreeable to proceed with surgery.  Hospital Course:  Mr. Jeremy Harrell. Presented to the [facility] and was brought to the operating room on 09/16. He underwent right upper lobectomy. He tolerated the procedure well, was extubated and transferred to the PACU in stable condition. A line and foley were removed early in his post operative course. Chest tube had a +++ air leak. Daily chest x rays were obtained and remained stable (subcutaneous emphysema right lateral chest wall, moderate right pneumothorax) but patient on room air and in no respiratory distress. Chest tube remained until air leak  resolved. All wounds are clean, dry, healing without signs of infection. He is ambulating on room air with good oxygenation. He has been tolerating a diet. Patient does not wish to go home with a mini express. Chest tube was placed back to suction.  Follow up CXR showed persistent right pneumothorax.  Unfortunately the patient's chest tube fell out.  CXR was obtained and showed increase in right sided pneumothorax.  He required replacement of chest tube which was performed bedside.  He tolerated the procedure without difficulty and follow up CXR showed decreased right pneumothorax.  His air leak resolved.  His chest tube unfortunately migrated out of the chest cavity and was removed..  Chest x-ray has been obtained and the pneumothorax/space has remained relatively stable and he remains relatively asymptomatic.  A chest x-ray was  obtained on 01/06/2023. This showed a stable moderate to large pneumothorax. He remained in stable condition. His incisions were healing well without sign of infection. He was ambulating well on RA. As discussed with Dr. Cliffton Asters he was felt stable for discharge home.

## 2022-12-23 NOTE — Interval H&P Note (Signed)
History and Physical Interval Note:  12/23/2022 12:21 PM  Jeremy Schilling Sr.  has presented today for surgery, with the diagnosis of LUNG CANCER.  The various methods of treatment have been discussed with the patient and family. After consideration of risks, benefits and other options for treatment, the patient has consented to  Procedure(s): XI ROBOTIC ASSISTED THORACOSCOPY-RIGHT UPPER LOBECTOMY (Right) as a surgical intervention.  The patient's history has been reviewed, patient examined, no change in status, stable for surgery.  I have reviewed the patient's chart and labs.  Questions were answered to the patient's satisfaction.     Aarti Mankowski Keane Scrape

## 2022-12-23 NOTE — Transfer of Care (Signed)
Immediate Anesthesia Transfer of Care Note  Patient: Jeremy Wojno Sr.  Procedure(s) Performed: XI ROBOTIC ASSISTED THORACOSCOPY-RIGHT UPPER LOBECTOMY (Right: Chest)  Patient Location: PACU  Anesthesia Type:General  Level of Consciousness: sedated  Airway & Oxygen Therapy: Patient Spontanous Breathing and Patient connected to face mask oxygen  Post-op Assessment: Report given to RN and Post -op Vital signs reviewed and stable  Post vital signs: Reviewed and stable  Last Vitals:  Vitals Value Taken Time  BP 108/52 12/23/22 1726  Temp    Pulse 67 12/23/22 1729  Resp 6 12/23/22 1729  SpO2 100 % 12/23/22 1729  Vitals shown include unfiled device data.  Last Pain:  Vitals:   12/23/22 1726  TempSrc:   PainSc: Asleep         Complications: No notable events documented.

## 2022-12-23 NOTE — Anesthesia Postprocedure Evaluation (Signed)
Anesthesia Post Note  Patient: Jeremy Taul Sr.  Procedure(s) Performed: XI ROBOTIC ASSISTED THORACOSCOPY-RIGHT UPPER LOBECTOMY (Right: Chest)     Patient location during evaluation: PACU Anesthesia Type: General Level of consciousness: awake and alert Pain management: pain level controlled Vital Signs Assessment: post-procedure vital signs reviewed and stable Respiratory status: spontaneous breathing, nonlabored ventilation, respiratory function stable and patient connected to nasal cannula oxygen Cardiovascular status: blood pressure returned to baseline and stable Postop Assessment: no apparent nausea or vomiting Anesthetic complications: no   No notable events documented.  Last Vitals:  Vitals:   12/23/22 1127 12/23/22 1726  BP: (!) 165/88 (!) 108/52  Pulse: 81 65  Resp: 18 (!) 8  Temp: 37.4 C (!) 36.1 C  SpO2: 90% 100%    Last Pain:  Vitals:   12/23/22 1726  TempSrc:   PainSc: Asleep                 Mariann Barter

## 2022-12-23 NOTE — Op Note (Signed)
      [address]       [name] [address]             [phone]        12/23/2022  Patient:  Jeremy Schilling Sr. Pre-Op Dx: Right upper lobe NSCLC   Post-op Dx:  same Procedure: - Robotic assisted right video thoracoscopy - right upper lobectomy - Mediastinal lymph node sampling - Intercostal nerve block  Surgeon and Role:      * Dolly Harbach, Eliezer Lofts, MD - Primary  Assistant: Webb Laws, PA-C  An experienced assistant was required given the complexity of this surgery and the standard of surgical care. The assistant was needed for exposure, dissection, suctioning, retraction of delicate tissues and sutures, instrument exchange and for overall help during this procedure.    Anesthesia  general EBL:  150 ml Blood Administration: none Specimen:  right upper lobe, hilar and mediastinal nodes  Drains: 28 F argyle chest tube in right chest Counts: correct   Indications: 72yo male with biopsy proven adenocarcinoma of the right upper lobe.  He also has a similar appearing lesion in the left upper lobe.  We discussed his case in our tumor board, and options include right upper lobectomy, and surveillance vs biopsy of the left upper lobe and SBRT to both.  He would like to proceed with a resection of the right upper lobe cancer, and surveillance of the left upper lobe lesion.  Risks and benefits of right RATS, with right upper lobectomy have been discussed.  He is agreeable to proceed.   Findings: Enlarged lymph nodes.  Incomplete fissure  Operative Technique: After the risks, benefits and alternatives were thoroughly discussed, the patient was brought to the operative theatre.  Anesthesia was induced, and the patient was then placed in a lateral decubitus position and was prepped and draped in normal sterile fashion.  An appropriate surgical pause was performed, and pre-operative antibiotics were dosed accordingly.  We began by placing our 4 robotic ports in  the the 7th intercostal space targeting the hilum of the lung.  A 12mm assistant port was placed in the 9th intercostal space in the anterior axillary line.  The robot was then docked and all instruments were passed under direct visualization.    The lung was then retracted superiorly, and the inferior pulmonary ligament was divided.  The hilum was mobilized anteriorly and posteriorly.  We identified the upper lobe pulmonary vein, and after careful isolation, it was divided with a vascular stapler.  We next moved to the pulmonary artery.  The artery was then divided with a vascular load stapler.  The bronchus to the upper lobe was then isolated.  After a test clamp, with good ventilation of the remaining lung, the bronchus was then divided.  The fissure was completed, and the specimen was passed into an endocatch bag.  It was removed from the anterior access site.    Lymph nodes were then sampled at hilum and mediastinum.  The chest was irrigated, and an air leak test was performed.  An intercostal nerve block was performed under direct visualization.  A 28 F chest tube was then placed, and we watch the remaining lobes re-expand.  The skin and soft tissue were closed with absorbable suture    The patient tolerated the procedure without any immediate complications, and was transferred to the PACU in stable condition.  Jeremy Harrell

## 2022-12-23 NOTE — Brief Op Note (Signed)
12/23/2022  4:17 PM  PATIENT:  Jeremy Schilling Sr.  71 y.o. male  PRE-OPERATIVE DIAGNOSIS:  LUNG CANCER  POST-OPERATIVE DIAGNOSIS:  LUNG CANCER  PROCEDURE:  Procedure(s): XI ROBOTIC ASSISTED THORACOSCOPY-RIGHT UPPER LOBECTOMY (Right)  SURGEON:  Surgeons and Role:    * Lightfoot, Eliezer Lofts, MD - Primary  PHYSICIAN ASSISTANT: Fain Francis PA-C, DONIELLE ZIMMERMAN PA-C   ANESTHESIA:   general  EBL:  100 ML  BLOOD ADMINISTERED:none  DRAINS:  RIGHT CHEST TUBE    LOCAL MEDICATIONS USED:  BUPIVICAINE  and  EXPAREL  SPECIMEN:  Source of Specimen:  RIGHT UPPER LOBE, LN SAMPLES  DISPOSITION OF SPECIMEN:  PATHOLOGY  COUNTS:  YES  TOURNIQUET:  * No tourniquets in log *  DICTATION: .Dragon Dictation  PLAN OF CARE: Admit to inpatient   PATIENT DISPOSITION:  PACU - hemodynamically stable.   Delay start of Pharmacological VTE agent (>24hrs) due to surgical blood loss or risk of bleeding: no   COMPLICATIONS: NO KNOWN

## 2022-12-23 NOTE — Anesthesia Procedure Notes (Signed)
Arterial Line Insertion Start/End9/16/2024 1:20 PM, 12/23/2022 1:30 PM Performed by: Darryl Nestle, CRNA, CRNA  Patient location: OR. Preanesthetic checklist: patient identified, IV checked, site marked, risks and benefits discussed, surgical consent, monitors and equipment checked, pre-op evaluation and timeout performed Left, radial was placed Catheter size: 20 G Hand hygiene performed , maximum sterile barriers used  and Seldinger technique used Allen's test indicative of satisfactory collateral circulation Attempts: 1 Procedure performed without using ultrasound guided technique. Ultrasound Notes:anatomy identified, needle tip was noted to be adjacent to the nerve/plexus identified and no ultrasound evidence of intravascular and/or intraneural injection Following insertion, Biopatch and dressing applied. Post procedure assessment: unchanged  Patient tolerated the procedure well with no immediate complications.

## 2022-12-23 NOTE — Anesthesia Procedure Notes (Signed)
Procedure Name: Intubation Date/Time: 12/23/2022 1:13 PM  Performed by: Darryl Nestle, CRNAPre-anesthesia Checklist: Patient identified, Emergency Drugs available, Suction available, Patient being monitored and Timeout performed Patient Re-evaluated:Patient Re-evaluated prior to induction Oxygen Delivery Method: Circle system utilized Preoxygenation: Pre-oxygenation with 100% oxygen Induction Type: IV induction Ventilation: Mask ventilation without difficulty Laryngoscope Size: Mac and 3 Grade View: Grade I Tube type: Oral Endobronchial tube: Double lumen EBT and EBT position confirmed by fiberoptic bronchoscope and 39 Fr Number of attempts: 2 Airway Equipment and Method: Fiberoptic brochoscope Placement Confirmation: ETT inserted through vocal cords under direct vision, CO2 detector, breath sounds checked- equal and bilateral and positive ETCO2 Tube secured with: Tape Dental Injury: Teeth and Oropharynx as per pre-operative assessment  Comments: First attempt by SRNA, 2nd attempt by MDA

## 2022-12-24 ENCOUNTER — Inpatient Hospital Stay (HOSPITAL_COMMUNITY): Payer: PPO

## 2022-12-24 LAB — CBC
HCT: 35.5 % — ABNORMAL LOW (ref 39.0–52.0)
Hemoglobin: 11.6 g/dL — ABNORMAL LOW (ref 13.0–17.0)
MCH: 30.9 pg (ref 26.0–34.0)
MCHC: 32.7 g/dL (ref 30.0–36.0)
MCV: 94.4 fL (ref 80.0–100.0)
Platelets: 267 10*3/uL (ref 150–400)
RBC: 3.76 MIL/uL — ABNORMAL LOW (ref 4.22–5.81)
RDW: 12.8 % (ref 11.5–15.5)
WBC: 12.5 10*3/uL — ABNORMAL HIGH (ref 4.0–10.5)
nRBC: 0 % (ref 0.0–0.2)

## 2022-12-24 LAB — BASIC METABOLIC PANEL
Anion gap: 11 (ref 5–15)
BUN: 14 mg/dL (ref 8–23)
CO2: 22 mmol/L (ref 22–32)
Calcium: 8.7 mg/dL — ABNORMAL LOW (ref 8.9–10.3)
Chloride: 104 mmol/L (ref 98–111)
Creatinine, Ser: 1.05 mg/dL (ref 0.61–1.24)
GFR, Estimated: >60 mL/min (ref >60)
Glucose, Bld: 155 mg/dL — ABNORMAL HIGH (ref 70–99)
Potassium: 3.8 mmol/L (ref 3.5–5.1)
Sodium: 137 mmol/L (ref 135–145)

## 2022-12-24 MED ORDER — IPRATROPIUM-ALBUTEROL 0.5-2.5 (3) MG/3ML IN SOLN: 3.0000 mL | Freq: Four times a day (QID) | RESPIRATORY_TRACT | Status: DC | PRN

## 2022-12-24 MED ORDER — POTASSIUM CHLORIDE CRYS ER 20 MEQ PO TBCR
30.0000 meq | EXTENDED_RELEASE_TABLET | Freq: Once | ORAL | Status: AC
  Administered 2022-12-24: 30 meq via ORAL
  Filled 2022-12-24: qty 1

## 2022-12-24 NOTE — Discharge Summary (Signed)
[address]       [name] [address]             [phone]    Physician Discharge Summary  Patient ID: Jeremy Torstenson Sr. MRN: [id number] DOB/AGE: 73/22/1952 73 y.o.  Admit date: 12/23/2022 Discharge date: 01/06/2023  Admission Diagnoses:  Patient Active Problem List   Diagnosis Date Noted   Lung mass 12/23/2022   Malignant neoplasm of right upper lobe of lung (HCC) 10/28/2022   Personal history of tobacco use, presenting hazards to health 02/25/2017     Discharge Diagnoses:  Patient Active Problem List   Diagnosis Date Noted   Lung mass 12/23/2022   S/P lobectomy of lung 12/23/2022   Malignant neoplasm of right upper lobe of lung (HCC) 10/28/2022   Personal history of tobacco use, presenting hazards to health 02/25/2017     Discharged Condition: Stable  History of Present Illness:    Mr. Jeremy Harrell is a 72 year old male with history of longstanding smoking but quit in July of this year. He underwent a CT lung cancer screening program in March 2024 which showed mixed attenuation right upper lobe lung nodule with solid components. Left upper lobe lung nodule is linear and may represent progressive scarring. This was followed by a CT chest and a CT super D chest in June 2024. CT showed no evidence of axillary mediastinal or hilar adenopathy. Mild paraseptal emphysema. Irregular subsolid 2.4 x 1.7 cm anterior right upper lobe lung nodule with a 0.5 cm solid component not significantly changed since March 2024 but has increased since 2018. There were also 1.7 cm left upper lobe groundglass opacities and another linear solid 0.6 cm left upper lobe lung nodule. These nodules were deemed to be okay with monitoring every 6 to 12 months but biopsy was recommended for the right upper lobe lung nodule. Patient had a bronchoscopy guided biopsy which showed adenocarcinoma with lepidic growth. He denies any chest pain, shortness of breath, or neurologic  symptoms.  Dr. Cliffton Asters reviewed the patient's diagnostic studies and determined he would benefit from surgical intervention. He reviewed the treatment options as well as the risks and benefits of surgery. Mr. Jeremy Sr. Was agreeable to proceed with surgery.  Hospital Course:  Mr. Jeremy Fons. Presented to the [facility] and was brought to the operating room on 09/16. He underwent right upper lobectomy. He tolerated the procedure well, was extubated and transferred to the PACU in stable condition. A line and foley were removed early in his post operative course. Chest tube had a +++ air leak. Daily chest x rays were obtained and remained stable (subcutaneous emphysema right lateral chest wall, moderate right pneumothorax) but patient on room air and in no respiratory distress. Chest tube remained until air leak resolved. All wounds are clean, dry, healing without signs of infection. He is ambulating on room air with good oxygenation. He has been tolerating a diet. Patient does not wish to go home with a mini express. Chest tube was placed back to suction.  Follow up CXR showed persistent right pneumothorax.  Unfortunately the patient's chest tube fell out.  CXR was obtained and showed increase in right sided pneumothorax.  He required replacement of chest tube which was performed bedside.  He tolerated the procedure without difficulty and follow up CXR showed decreased right pneumothorax.  His air leak resolved.  His chest tube unfortunately migrated out of the chest cavity and was removed..  Chest x-ray  has been obtained and the pneumothorax/space has remained relatively stable and he remains relatively asymptomatic.  A chest x-ray was  obtained on 01/06/2023. This showed a stable moderate to large pneumothorax. He remained in stable condition. His incisions were healing well without sign of infection. He was ambulating well on RA. As discussed with Dr. Cliffton Asters he was felt stable for discharge  home.   Consults: None  Significant Diagnostic Studies:   Narrative & Impression  CLINICAL DATA:  Pneumothorax.   EXAM: PORTABLE CHEST 1 VIEW   COMPARISON:  One-view chest x-ray 12/29/2022   FINDINGS: The heart size is normal. A right sided chest tube is in place. Moderate pneumothorax of 20% is stable. Extensive subcutaneous emphysema is present over the right chest wall and neck. The left lung is clear.   IMPRESSION: 1. Stable moderate right pneumothorax with chest tube in place. 2. Extensive subcutaneous emphysema over the right chest wall and neck.     Electronically Signed   By: Marin Roberts M.D.   On: 12/30/2022 07:49   CLINICAL DATA:  Follow-up right pneumothorax.   EXAM: PORTABLE CHEST 1 VIEW   COMPARISON:  12/24/2022   FINDINGS: A right chest tube remains in place with its tip at the right lung apex. No significant change in an approximately 30% right apical and basilar pneumothorax. A small amount of mediastinal air is unchanged. Increased subcutaneous emphysema on the right.   The patient is rotated to the right with no mediastinal shift to the left. Stable mild changes of COPD. Tortuous aorta. Mild-to-moderate bilateral AC joint degenerative changes. Thoracic spine degenerative changes.   IMPRESSION: 1. No significant change in an approximately 30% right apical and basilar pneumothorax with a right chest tube in place. 2. Increased subcutaneous emphysema on the right. 3. Stable small amount of mediastinal air. 4. Stable changes of COPD.     Electronically Signed   By: Beckie Salts M.D.   On: 12/25/2022 11:07  Treatments: surgery:  - Robotic assisted right video thoracoscopy - right upper lobectomy - Mediastinal lymph node sampling - Intercostal nerve block by Dr. Cliffton Asters on 12/23/2022.  Pathology:  FINAL MICROSCOPIC DIAGNOSIS:   A. LYMPH NODE, HILAR, EXCISION:  One lymph node negative for metastatic carcinoma (0/1)   B.  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 rate 18, height 5' 5.98" (1.676 m), weight 65.6 kg, SpO2 98%. General appearance: alert, cooperative, and no distress Neurologic: intact Heart: regular rate and rhythm, S1, S2 normal, no murmur, click, rub or gallop Lungs: diminished breath sounds on the right Abdomen: soft, non-tender; bowel sounds normal; no masses,  no organomegaly Extremities: extremities normal, atraumatic, no cyanosis or edema Wound: Clean and dry without sign of infection.    Discharge Medications:  Allergies as of 01/06/2023   No Known Allergies      Medication List     TAKE these medications    aspirin EC 81 MG tablet Take 81 mg by mouth daily.   atorvastatin 80 MG tablet Commonly known as: LIPITOR Take 80 mg by mouth daily.   bisoprolol 10 MG tablet Commonly known as: ZEBETA Take 1 tablet (10 mg total) by mouth daily.   FLUoxetine 40 MG  capsule Commonly known as: PROZAC Take 40 mg by mouth daily.   multivitamin tablet Take 1 tablet by mouth daily.   omeprazole 20 MG capsule Commonly known as: PRILOSEC Take 20 mg by mouth daily.   oxyCODONE 5 MG immediate release tablet Commonly known as: Oxy IR/ROXICODONE Take 1 tablet (5 mg total) by mouth every 4 (four) hours as needed for moderate pain.        Follow-up Information     Corliss Skains, MD. Go on 01/17/2023.   Specialty: Cardiothoracic Surgery Why: Appointment time is at 10:50AM Contact information: [address] [place] [address] [phone]                 Signed:  Jenny Reichmann, PA-C 01/06/2023, 12:19 PM

## 2022-12-24 NOTE — Progress Notes (Signed)
Mobility Specialist Progress Note:   12/24/22 1000  Mobility  Activity Ambulated with assistance in hallway  Level of Assistance Contact guard assist, steadying assist  Assistive Device None  Distance Ambulated (ft) 340 ft  Activity Response Tolerated well  Mobility Referral Yes  $Mobility charge 1 Mobility  Mobility Specialist Start Time (ACUTE ONLY) 1026  Mobility Specialist Stop Time (ACUTE ONLY) 1039  Mobility Specialist Time Calculation (min) (ACUTE ONLY) 13 min    Pre Mobility: 94 HR, 98% SpO2 During Mobility: 107 HR,  96% SpO2 Post Mobility:  101 HR,  925 SpO2  Pt received in chair, agreeable to mobility. Asymptomatic throughout w/ no complaints. Pt left in chair with call bell and all needs met.  D'Vante Earlene Plater Mobility Specialist Please contact via Special educational needs teacher or Rehab office at [phone]

## 2022-12-24 NOTE — Progress Notes (Addendum)
      [address]       [name] [address]             [phone]       1 Day Post-Op Procedure(s) (LRB): XI ROBOTIC ASSISTED THORACOSCOPY-RIGHT UPPER LOBECTOMY (Right)  Subjective: Patient has some incisional/chest tube pain this am, but is tolerable. He denies nausea.  Objective: Vital signs in last 24 hours: Temp:  [97 F (36.1 C)-99.3 F (37.4 C)] 98.8 F (37.1 C) (09/17 0610) Pulse Rate:  [65-81] 80 (09/17 0610) Cardiac Rhythm: Normal sinus rhythm;Bundle branch block (09/16 2131) Resp:  [6-18] 17 (09/17 0610) BP: (97-165)/(52-88) 153/88 (09/17 0610) SpO2:  [90 %-100 %] 97 % (09/17 0610) Arterial Line BP: (127-152)/(52-83) 152/83 (09/16 1915) Weight:  [65.6 kg] 65.6 kg (09/16 2238)     Intake/Output from previous day: 09/16 0701 - 09/17 0700 In: 1800 [I.V.:1700; IV Piggyback:100] Out: 854 [Urine:730; Chest Tube:124]   Physical Exam:  Cardiovascular: RRR Pulmonary: Clear to auscultation on left, decreased right apex and coarse/rub at base Abdomen: Soft, non tender, bowel sounds present. Extremities: SCDs in place Wounds: Clean and dry.  No erythema or signs of infection. Chest Tube: to water seal, +++ air leak with cough  Lab Results: CBC: Recent Labs    12/24/22 0210  WBC 12.5*  HGB 11.6*  HCT 35.5*  PLT 267   BMET:  Recent Labs    12/24/22 0210  NA 137  K 3.8  CL 104  CO2 22  GLUCOSE 155*  BUN 14  CREATININE 1.05  CALCIUM 8.7*    PT/INR: No results for input(s): "LABPROT", "INR" in the last 72 hours. ABG:  INR: Will add last result for INR, ABG once components are confirmed Will add last 4 CBG results once components are confirmed  Assessment/Plan:  1. CV - SR. 2.  Pulmonary - History of COPD. On room air this am. Chest tube with 124 cc since surgery. Chest tube is to water seal, +++ air leak with cough. CXR this am appears to show moderate right pneumothorax and subcutaneous emphysema right lateral chest wall.  Chest tube to remain. Will discuss with surgeon if he thinks would benefit being placed back to suction.Encourage incentive spirometer and flutter valve. Await finally pathology. 3. Supplement potassium 4. On Lovenox for DVT prophylaxis 5. Expected post op blood loss anemia-H and H this am decreased to 11.6 and 35.5  Donielle M ZimmermanPA-C 12/24/2022,7:05 AM  Agree with above IS, ambulation Will keep CT  Ameria Sanjurjo O Ry Moody

## 2022-12-24 NOTE — TOC Initial Note (Signed)
Transition of Care (TOC) - Initial/Assessment Note    Patient Details  Name: Jeremy Jez Sr. MRN: [id number] Date of Birth: 04-29-1950  Transition of Care [facility]) CM/SW Contact:    Leone Haven, RN Phone Number: 12/24/2022, 5:41 PM  Clinical Narrative:                 From home with spouse, has PCP and insurance on file, states has no HH services in place at this time , he has a cane and a walker at home.  States son  will transport him home at [employer] and family is support system, states gets medications from [facility].   Pta ambulatory with walker.  Expected Discharge Plan: Home/Self Care Barriers to Discharge: Continued Medical Work up   Patient Goals and CMS Choice Patient states their goals for this hospitalization and ongoing recovery are:: return home   Choice offered to / list presented to : NA      Expected Discharge Plan and Services In-house Referral: NA Discharge Planning Services: CM Consult Post Acute Care Choice: NA Living arrangements for the past 2 months: Single Family Home                 DME Arranged: N/A DME Agency: NA       HH Arranged: NA          Prior Living Arrangements/Services Living arrangements for the past 2 months: Single Family Home Lives with:: Spouse Patient language and need for interpreter reviewed:: Yes Do you feel safe going back to the place where you live?: Yes      Need for Family Participation in Patient Care: Yes (Comment) Care giver support system in place?: Yes (comment) Current home services: DME (walker , cane) Criminal Activity/Legal Involvement Pertinent to Current Situation/Hospitalization: No - Comment as needed  Activities of Daily Living Home Assistive Devices/Equipment: Cane (specify quad or straight) ADL Screening (condition at time of admission) Patient's cognitive ability adequate to safely complete daily activities?: Yes Is the patient deaf or have difficulty hearing?: No Does the  patient have difficulty seeing, even when wearing glasses/contacts?: No Does the patient have difficulty concentrating, remembering, or making decisions?: Yes Patient able to express need for assistance with ADLs?: No Does the patient have difficulty dressing or bathing?: No Independently performs ADLs?: Yes (appropriate for developmental age) Does the patient have difficulty walking or climbing stairs?: No Weakness of Legs: Right Weakness of Arms/Hands: Right  Permission Sought/Granted Permission sought to share information with : Case Manager Permission granted to share information with : Yes, Verbal Permission Granted              Emotional Assessment   Attitude/Demeanor/Rapport: Engaged Affect (typically observed): Appropriate Orientation: : Oriented to Self, Oriented to Place, Oriented to  Time, Oriented to Situation Alcohol / Substance Use: Not Applicable Psych Involvement: No (comment)  Admission diagnosis:  Lung mass [R91.8] S/P lobectomy of lung [Z90.2] Patient Active Problem List   Diagnosis Date Noted   Lung mass 12/23/2022   S/P lobectomy of lung 12/23/2022   Malignant neoplasm of right upper lobe of lung (HCC) 10/28/2022   Personal history of tobacco use, presenting hazards to health 02/25/2017   PCP:  Lynnea Ferrier, MD Pharmacy:   [facility] [phone] [place], [place] - [address] AT [facility] OF [address] [place] [address] Phone: [phone] Fax: [phone]     Social Determinants of  Health (SDOH) Social History: SDOH Screenings   Food Insecurity: No Food Insecurity (12/23/2022)  Housing: Low Risk  (12/23/2022)  Transportation Needs: No Transportation Needs (12/23/2022)  Utilities: Not At Risk (12/23/2022)  Depression (PHQ2-9): Low Risk  (10/28/2022)  Financial Resource Strain: Low Risk  (06/10/2022)   Received from [facility]  Tobacco Use: Medium Risk (12/23/2022)    SDOH Interventions:     Readmission Risk Interventions     No data to display

## 2022-12-24 NOTE — Plan of Care (Signed)
Problem: Activity: Goal: Risk for activity intolerance will decrease Outcome: Progressing   Problem: Respiratory: Goal: Respiratory status will improve Outcome: Progressing   Problem: Pain Management: Goal: Pain level will decrease Outcome: Progressing   Problem: Skin Integrity: Goal: Wound healing without signs and symptoms infection will improve Outcome: Progressing

## 2022-12-24 NOTE — Plan of Care (Signed)

## 2022-12-24 NOTE — Discharge Instructions (Signed)
Robot-Assisted Thoracic Surgery, Care After The following information offers guidance on how to care for yourself after your procedure. Your health care provider may also give you more specific instructions. If you have problems or questions, contact your health care provider. What can I expect after the procedure? After the procedure, it is common to have: Some pain and aches in the area of your surgical incisions. Pain when breathing in (inhaling) and coughing. Tiredness (fatigue). Trouble sleeping. Constipation. Follow these instructions at home: Medicines Take over-the-counter and prescription medicines only as told by your health care provider. If you were prescribed an antibiotic medicine, take it as told by your health care provider. Do not stop taking the antibiotic even if you start to feel better. Talk with your health care provider about safe and effective ways to manage pain after your procedure. Pain management should fit your specific health needs. Take pain medicine before pain becomes severe. Relieving and controlling your pain will make breathing easier for you. Ask your health care provider if the medicine prescribed to you requires you to avoid driving or using machinery. Eating and drinking Follow instructions from your health care provider about eating or drinking restrictions. These will vary depending on what procedure you had. Your health care provider may recommend: A liquid diet or soft diet for the first few days. Meals that are smaller and more frequent. A diet of fruits, vegetables, whole grains, and low-fat proteins. Limiting foods that are high in fat and processed sugar, including fried or sweet foods. Incision care Follow instructions from your health care provider about how to take care of your incisions. Make sure you: Wash your hands with soap and water for at least 20 seconds before and after you change your bandage (dressing). If soap and water are not  available, use hand sanitizer. Change your dressing as told by your health care provider. Leave stitches (sutures), skin glue, or adhesive strips in place. These skin closures may need to stay in place for 2 weeks or longer. If adhesive strip edges start to loosen and curl up, you may trim the loose edges. Do not remove adhesive strips completely unless your health care provider tells you to do that. Check your incision area every day for signs of infection. Check for: Redness, swelling, or more pain. Fluid or blood. Warmth. Pus or a bad smell. Activity Return to your normal activities as told by your health care provider. Ask your health care provider what activities are safe for you. Ask your health care provider when it is safe for you to drive. Do not lift anything that is heavier than 10 lb (4.5 kg), or the limit that you are told, until your health care provider says that it is safe. Rest as told by your health care provider. Avoid sitting for a long time without moving. Get up to take short walks every 1-2 hours. This is important to improve blood flow and breathing. Ask for help if you feel weak or unsteady. Do exercises as told by your health care provider. Pneumonia prevention  Do deep breathing exercises and cough regularly as directed. This helps clear mucus and opens your lungs. Doing this helps prevent lung infection (pneumonia). If you were given an incentive spirometer, use it as told. An incentive spirometer is a tool that measures how well you are filling your lungs with each breath. Coughing may hurt less if you try to support your chest. This is called splinting. Try one of these when you  cough: Hold a pillow against your chest. Place the palms of both hands on top of your incision area. Do not use any products that contain nicotine or tobacco. These products include cigarettes, chewing tobacco, and vaping devices, such as e-cigarettes. If you need help quitting, ask your  health care provider. Avoid secondhand smoke. General instructions If you have a drainage tube: Follow instructions from your health care provider about how to take care of it. Do not travel by airplane after your tube is removed until your health care provider tells you it is safe. You may need to take these actions to prevent or treat constipation: Drink enough fluid to keep your urine pale yellow. Take over-the-counter or prescription medicines. Eat foods that are high in fiber, such as beans, whole grains, and fresh fruits and vegetables. Limit foods that are high in fat and processed sugars, such as fried or sweet foods. Keep all follow-up visits. This is important. Contact a health care provider if: You have redness, swelling, or more pain around an incision. You have fluid or blood coming from an incision. An incision feels warm to the touch. You have pus or a bad smell coming from an incision. You have a fever. You cannot eat or drink without vomiting. Your pain medicine is not controlling your pain. Get help right away if: You have chest pain. Your heart is beating quickly. You have trouble breathing. You have trouble speaking. You are confused. You feel weak or dizzy, or you faint. These symptoms may represent a serious problem that is an emergency. Do not wait to see if the symptoms will go away. Get medical help right away. Call your local emergency services (911 in the [place] Do not drive yourself to the hospital. Summary Talk with your health care provider about safe and effective ways to manage pain after your procedure. Pain management should fit your specific health needs. Return to your normal activities as told by your health care provider. Ask your health care provider what activities are safe for you. Do deep breathing exercises and cough regularly as directed. This helps to clear mucus and prevent pneumonia. If it hurts to cough, ease pain by holding a pillow  against your chest or by placing the palms of both hands over your incisions. This information is not intended to replace advice given to you by your health care provider. Make sure you discuss any questions you have with your health care provider. Document Revised: 12/16/2019 Document Reviewed: 12/17/2019 Elsevier Patient Education  2024 [employer].

## 2022-12-25 ENCOUNTER — Inpatient Hospital Stay (HOSPITAL_COMMUNITY): Payer: PPO

## 2022-12-25 NOTE — Plan of Care (Signed)
Tech called RN into room at 2330 to report that chest tube dressing was saturated with blood.   Patient experiencing no respiratory distress and suture still in place for chest tube.   Dressing changed without issue.   Problem: Education: Goal: Knowledge of disease or condition will improve Outcome: Progressing Goal: Knowledge of the prescribed therapeutic regimen will improve Outcome: Progressing   Problem: Activity: Goal: Risk for activity intolerance will decrease Outcome: Progressing   Problem: Cardiac: Goal: Will achieve and/or maintain hemodynamic stability Outcome: Progressing   Problem: Respiratory: Goal: Respiratory status will improve Outcome: Progressing   Problem: Pain Management: Goal: Pain level will decrease Outcome: Progressing   Problem: Skin Integrity: Goal: Wound healing without signs and symptoms infection will improve Outcome: Progressing   Problem: Education: Goal: Knowledge of General Education information will improve Description: Including pain rating scale, medication(s)/side effects and non-pharmacologic comfort measures Outcome: Progressing   Problem: Health Behavior/Discharge Planning: Goal: Ability to manage health-related needs will improve Outcome: Progressing   Problem: Clinical Measurements: Goal: Ability to maintain clinical measurements within normal limits will improve Outcome: Progressing Goal: Will remain free from infection Outcome: Progressing   Problem: Nutrition: Goal: Adequate nutrition will be maintained Outcome: Progressing   Problem: Coping: Goal: Level of anxiety will decrease Outcome: Progressing   Problem: Elimination: Goal: Will not experience complications related to bowel motility Outcome: Progressing Goal: Will not experience complications related to urinary retention Outcome: Progressing   Problem: Pain Managment: Goal: General experience of comfort will improve Outcome: Progressing   Problem:  Safety: Goal: Ability to remain free from injury will improve Outcome: Progressing   Problem: Clinical Measurements: Goal: Postoperative complications will be avoided or minimized Outcome: Not Progressing

## 2022-12-25 NOTE — Progress Notes (Signed)
Mobility Specialist Progress Note:   12/25/22 1000  Mobility  Activity Ambulated with assistance in hallway  Level of Assistance Contact guard assist, steadying assist  Assistive Device None  Distance Ambulated (ft) 340 ft  Activity Response Tolerated well  Mobility Referral Yes  $Mobility charge 1 Mobility  Mobility Specialist Start Time (ACUTE ONLY) 1008  Mobility Specialist Stop Time (ACUTE ONLY) 1018  Mobility Specialist Time Calculation (min) (ACUTE ONLY) 10 min    Pre Mobility: 83 HR,  95% SpO2 During Mobility: 106 HR Post Mobility:  104 HR,  94% SpO2  Pt received in chair, agreeable to mobility. Asymptomatic throughout w/ no complaints. Pt left in bed with call bell and all needs met.  D'Vante Earlene Plater Mobility Specialist Please contact via Special educational needs teacher or Rehab office at [phone]

## 2022-12-25 NOTE — Progress Notes (Addendum)
      [address]       [name] [address]             [phone]       2 Days Post-Op Procedure(s) (LRB): XI ROBOTIC ASSISTED THORACOSCOPY-RIGHT UPPER LOBECTOMY (Right)  Subjective: Patient sitting in chair, eating breakfast. He has some incisional/chest tube pain again this am. He walked once yesterday.  Objective: Vital signs in last 24 hours: Temp:  [97.8 F (36.6 C)-99.2 F (37.3 C)] 97.8 F (36.6 C) (09/18 0433) Pulse Rate:  [68-96] 68 (09/18 0433) Cardiac Rhythm: Sinus tachycardia (09/17 1904) Resp:  [14-21] 14 (09/18 0433) BP: (122-132)/(71-87) 126/87 (09/18 0433) SpO2:  [94 %-98 %] 96 % (09/18 0433)     Intake/Output from previous day: 09/17 0701 - 09/18 0700 In: 680 [P.O.:480; IV Piggyback:200] Out: 1080 [Urine:800; Chest Tube:280]   Physical Exam:  Cardiovascular: RRR Pulmonary: Clear to auscultation on left, decreased right apex and coarse/rub at base Abdomen: Soft, non tender, bowel sounds present. Extremities: No LE edema or calf pain Wounds: Clean and dry.  No erythema or signs of infection. Chest Tube: to water seal, ++ air leak with cough  Lab Results: CBC: Recent Labs    12/24/22 0210 12/25/22 0225  WBC 12.5* 8.8  HGB 11.6* 10.4*  HCT 35.5* 32.0*  PLT 267 237   BMET:  Recent Labs    12/24/22 0210 12/25/22 0225  NA 137 137  K 3.8 4.1  CL 104 105  CO2 22 26  GLUCOSE 155* 132*  BUN 14 23  CREATININE 1.05 1.09  CALCIUM 8.7* 8.7*    PT/INR: No results for input(s): "LABPROT", "INR" in the last 72 hours. ABG:  INR: Will add last result for INR, ABG once components are confirmed Will add last 4 CBG results once components are confirmed  Assessment/Plan:  1. CV - Slightly tachycardic this am. 2.  Pulmonary - History of COPD. On room air this am. Chest tube with 280 cc last 24 hours. Chest tube is to water seal, ++ air leak with cough. CXR this am appears to show moderate right pneumothorax (? increased) and  subcutaneous emphysema right lateral chest wall. Chest tube to remain. Encourage incentive spirometer and flutter valve. Await finally pathology. 3. On Lovenox for DVT prophylaxis 4. Expected post op blood loss anemia-H and H this am decreased to 10.4 and 32  Donielle M ZimmermanPA-C 12/25/2022,6:56 AM   Agree with above IS, ambulation  Sherrica Niehaus O Danis [place]

## 2022-12-25 NOTE — Plan of Care (Signed)
Problem: Education: Goal: Knowledge of disease or condition will improve Outcome: Progressing Goal: Knowledge of the prescribed therapeutic regimen will improve Outcome: Progressing   Problem: Activity: Goal: Risk for activity intolerance will decrease Outcome: Progressing   Problem: Cardiac: Goal: Will achieve and/or maintain hemodynamic stability Outcome: Progressing   Problem: Clinical Measurements: Goal: Postoperative complications will be avoided or minimized Outcome: Progressing   Problem: Respiratory: Goal: Respiratory status will improve Outcome: Progressing   Problem: Pain Management: Goal: Pain level will decrease Outcome: Progressing   Problem: Skin Integrity: Goal: Wound healing without signs and symptoms infection will improve Outcome: Progressing   Problem: Education: Goal: Knowledge of General Education information will improve Description: Including pain rating scale, medication(s)/side effects and non-pharmacologic comfort measures Outcome: Progressing   Problem: Health Behavior/Discharge Planning: Goal: Ability to manage health-related needs will improve Outcome: Progressing   Problem: Clinical Measurements: Goal: Ability to maintain clinical measurements within normal limits will improve Outcome: Progressing Goal: Will remain free from infection Outcome: Progressing Goal: Diagnostic test results will improve Outcome: Progressing Goal: Respiratory complications will improve Outcome: Progressing Goal: Cardiovascular complication will be avoided Outcome: Progressing   Problem: Activity: Goal: Risk for activity intolerance will decrease Outcome: Progressing   Problem: Nutrition: Goal: Adequate nutrition will be maintained Outcome: Progressing   Problem: Coping: Goal: Level of anxiety will decrease Outcome: Progressing   Problem: Elimination: Goal: Will not experience complications related to bowel motility Outcome: Progressing Goal:  Will not experience complications related to urinary retention Outcome: Progressing   Problem: Pain Managment: Goal: General experience of comfort will improve Outcome: Progressing   Problem: Safety: Goal: Ability to remain free from injury will improve Outcome: Progressing   Problem: Skin Integrity: Goal: Risk for impaired skin integrity will decrease Outcome: Progressing   Problem: Education: Goal: Knowledge of disease or condition will improve Outcome: Progressing Goal: Knowledge of the prescribed therapeutic regimen will improve Outcome: Progressing   Problem: Activity: Goal: Risk for activity intolerance will decrease Outcome: Progressing   Problem: Cardiac: Goal: Will achieve and/or maintain hemodynamic stability Outcome: Progressing   Problem: Clinical Measurements: Goal: Postoperative complications will be avoided or minimized Outcome: Progressing   Problem: Respiratory: Goal: Respiratory status will improve Outcome: Progressing   Problem: Pain Management: Goal: Pain level will decrease Outcome: Progressing   Problem: Skin Integrity: Goal: Wound healing without signs and symptoms infection will improve Outcome: Progressing   Problem: Education: Goal: Knowledge of General Education information will improve Description: Including pain rating scale, medication(s)/side effects and non-pharmacologic comfort measures Outcome: Progressing   Problem: Health Behavior/Discharge Planning: Goal: Ability to manage health-related needs will improve Outcome: Progressing   Problem: Clinical Measurements: Goal: Ability to maintain clinical measurements within normal limits will improve Outcome: Progressing Goal: Will remain free from infection Outcome: Progressing Goal: Diagnostic test results will improve Outcome: Progressing Goal: Respiratory complications will improve Outcome: Progressing Goal: Cardiovascular complication will be avoided Outcome:  Progressing   Problem: Activity: Goal: Risk for activity intolerance will decrease Outcome: Progressing   Problem: Nutrition: Goal: Adequate nutrition will be maintained Outcome: Progressing   Problem: Coping: Goal: Level of anxiety will decrease Outcome: Progressing   Problem: Elimination: Goal: Will not experience complications related to bowel motility Outcome: Progressing Goal: Will not experience complications related to urinary retention Outcome: Progressing   Problem: Pain Managment: Goal: General experience of comfort will improve Outcome: Progressing   Problem: Safety: Goal: Ability to remain free from injury will improve Outcome: Progressing   Problem: Skin Integrity: Goal: Risk for impaired  skin integrity will decrease Outcome: Progressing   Problem: Education: Goal: Knowledge of disease or condition will improve Outcome: Progressing Goal: Knowledge of the prescribed therapeutic regimen will improve Outcome: Progressing   Problem: Activity: Goal: Risk for activity intolerance will decrease Outcome: Progressing   Problem: Cardiac: Goal: Will achieve and/or maintain hemodynamic stability Outcome: Progressing   Problem: Clinical Measurements: Goal: Postoperative complications will be avoided or minimized Outcome: Progressing   Problem: Respiratory: Goal: Respiratory status will improve Outcome: Progressing   Problem: Pain Management: Goal: Pain level will decrease Outcome: Progressing   Problem: Skin Integrity: Goal: Wound healing without signs and symptoms infection will improve Outcome: Progressing   Problem: Education: Goal: Knowledge of General Education information will improve Description: Including pain rating scale, medication(s)/side effects and non-pharmacologic comfort measures Outcome: Progressing   Problem: Health Behavior/Discharge Planning: Goal: Ability to manage health-related needs will improve Outcome: Progressing    Problem: Clinical Measurements: Goal: Ability to maintain clinical measurements within normal limits will improve Outcome: Progressing Goal: Will remain free from infection Outcome: Progressing Goal: Diagnostic test results will improve Outcome: Progressing Goal: Respiratory complications will improve Outcome: Progressing Goal: Cardiovascular complication will be avoided Outcome: Progressing   Problem: Activity: Goal: Risk for activity intolerance will decrease Outcome: Progressing   Problem: Nutrition: Goal: Adequate nutrition will be maintained Outcome: Progressing   Problem: Coping: Goal: Level of anxiety will decrease Outcome: Progressing   Problem: Elimination: Goal: Will not experience complications related to bowel motility Outcome: Progressing Goal: Will not experience complications related to urinary retention Outcome: Progressing   Problem: Pain Managment: Goal: General experience of comfort will improve Outcome: Progressing   Problem: Safety: Goal: Ability to remain free from injury will improve Outcome: Progressing   Problem: Skin Integrity: Goal: Risk for impaired skin integrity will decrease Outcome: Progressing

## 2022-12-26 ENCOUNTER — Inpatient Hospital Stay (HOSPITAL_COMMUNITY): Payer: PPO

## 2022-12-26 NOTE — Care Management Important Message (Signed)
Important Message  Patient Details  Name: Jeremy Kopko Sr. MRN: [id number] Date of Birth: 1950-11-20   Medicare Important Message Given:  Yes     Donata Reddick 12/26/2022, 3:29 PM

## 2022-12-26 NOTE — Progress Notes (Signed)
Mobility Specialist Progress Note:   12/26/22 1000  Mobility  Activity Ambulated with assistance in hallway  Level of Assistance Standby assist, set-up cues, supervision of patient - no hands on  Assistive Device None  Distance Ambulated (ft) 450 ft  Activity Response Tolerated well  Mobility Referral Yes  $Mobility charge 1 Mobility  Mobility Specialist Start Time (ACUTE ONLY) 0913  Mobility Specialist Stop Time (ACUTE ONLY) 0924  Mobility Specialist Time Calculation (min) (ACUTE ONLY) 11 min    Pre Mobility: 89 HR During Mobility: 106 HR,  94% SpO2 Post Mobility:  102 HR,   92% SpO2  Received pt in chair having no complaints and agreeable to mobility. Pt was asymptomatic throughout ambulation and returned to room w/o fault. Left in chair w/ call bell in reach and all needs met.   Jeremy Harrell Mobility Specialist Please contact via Special educational needs teacher or Rehab office at [phone]

## 2022-12-26 NOTE — Plan of Care (Signed)

## 2022-12-26 NOTE — Plan of Care (Signed)
  Problem: Education: Goal: Knowledge of disease or condition will improve Outcome: Progressing   Problem: Activity: Goal: Risk for activity intolerance will decrease Outcome: Progressing   Problem: Pain Management: Goal: Pain level will decrease Outcome: Progressing   Problem: Skin Integrity: Goal: Wound healing without signs and symptoms infection will improve Outcome: Progressing

## 2022-12-26 NOTE — Progress Notes (Addendum)
      [address]       [name] [address]             [phone]       3 Days Post-Op Procedure(s) (LRB): XI ROBOTIC ASSISTED THORACOSCOPY-RIGHT UPPER LOBECTOMY (Right)  Subjective: Patient eating breakfast this am. He did walk yesterday. He has no specific complaint this am.  Objective: Vital signs in last 24 hours: Temp:  [97.7 F (36.5 C)-99.2 F (37.3 C)] 98.7 F (37.1 C) (09/19 0401) Pulse Rate:  [74-100] 74 (09/19 0401) Cardiac Rhythm: Normal sinus rhythm;Bundle branch block (09/18 1900) Resp:  [12-20] 12 (09/19 0401) BP: (106-135)/(74-96) 106/82 (09/19 0401) SpO2:  [95 %-100 %] 97 % (09/19 0401)     Intake/Output from previous day: 09/18 0701 - 09/19 0700 In: 1320 [P.O.:1320] Out: 1390 [Urine:1350; Chest Tube:40]   Physical Exam:  Cardiovascular: RRR Pulmonary: Clear to auscultation on left, decreased right apex and coarse/rub at base Abdomen: Soft, non tender, bowel sounds present. Extremities: No LE edema or calf pain Wounds: Clean and dry.  No erythema or signs of infection. Chest tube dressing changed this am (sero sanguineous drainage from around chest tube) Chest Tube: to water seal, continues to have ++ air leak with cough  Lab Results: CBC: Recent Labs    12/24/22 0210 12/25/22 0225  WBC 12.5* 8.8  HGB 11.6* 10.4*  HCT 35.5* 32.0*  PLT 267 237   BMET:  Recent Labs    12/24/22 0210 12/25/22 0225  NA 137 137  K 3.8 4.1  CL 104 105  CO2 22 26  GLUCOSE 155* 132*  BUN 14 23  CREATININE 1.05 1.09  CALCIUM 8.7* 8.7*    PT/INR: No results for input(s): "LABPROT", "INR" in the last 72 hours. ABG:  INR: Will add last result for INR, ABG once components are confirmed Will add last 4 CBG results once components are confirmed  Assessment/Plan:  1. CV - Tachycardic this am. 2.  Pulmonary - History of COPD. On room air this am. Chest tube with 40 cc last 24 hours. Chest tube is to water seal, continues to have ++ air  leak with cough. CXR this am appears to show moderate right pneumothorax and subcutaneous emphysema right lateral chest wall/neck. Chest tube to remain. May need to consider placing to mini express in next several days if air leak does not resolve. Encourage incentive spirometer and flutter valve. Final pathology: TNM Code-pT1b, pN0. Dr. Cliffton Asters will discuss with patient 3. On Lovenox for DVT prophylaxis 4. Expected post op blood loss anemia-H and H this am decreased to 10.4 and 32  Donielle M ZimmermanPA-C 12/26/2022,7:02 AM   Agree with above IS, ambulation  Jamarius Saha O Pollyann Roa

## 2022-12-27 ENCOUNTER — Inpatient Hospital Stay (HOSPITAL_COMMUNITY): Payer: PPO

## 2022-12-27 LAB — GLUCOSE, CAPILLARY: Glucose-Capillary: 114 mg/dL — ABNORMAL HIGH (ref 70–99)

## 2022-12-27 NOTE — Progress Notes (Addendum)
      [address]       [place] [address]             [phone]       4 Days Post-Op Procedure(s) (LRB): XI ROBOTIC ASSISTED THORACOSCOPY-RIGHT UPPER LOBECTOMY (Right)  Subjective: Patient sitting in chair, eating breakfast. He has no specific complaint this am. He walked once yesterday  Objective: Vital signs in last 24 hours: Temp:  [97.8 F (36.6 C)-99 F (37.2 C)] 98.3 F (36.8 C) (09/20 0409) Pulse Rate:  [72-80] 72 (09/20 0409) Cardiac Rhythm: Normal sinus rhythm (09/19 2051) Resp:  [12-16] 15 (09/20 0409) BP: (102-141)/(74-92) 122/74 (09/20 0409) SpO2:  [97 %-98 %] 97 % (09/20 0409)     Intake/Output from previous day: 09/19 0701 - 09/20 0700 In: 480 [P.O.:480] Out: 1585 [Urine:1525; Chest Tube:60]   Physical Exam:  Cardiovascular: RRR, Pulmonary: Clear to auscultation on left and coarse/rub on right Abdomen: Soft, non tender, bowel sounds present. Extremities: No lower extremity edema. Wounds: Clean and dry.  No erythema or signs of infection. Chest Tube: To water seal, +++ air leak with cough  Lab Results: CBC: Recent Labs    12/25/22 0225  WBC 8.8  HGB 10.4*  HCT 32.0*  PLT 237   BMET:  Recent Labs    12/25/22 0225  NA 137  K 4.1  CL 105  CO2 26  GLUCOSE 132*  BUN 23  CREATININE 1.09  CALCIUM 8.7*    PT/INR: No results for input(s): "LABPROT", "INR" in the last 72 hours. ABG:  INR: Will add last result for INR, ABG once components are confirmed Will add last 4 CBG results once components are confirmed  Assessment/Plan:  1. CV - SR this am. 2.  Pulmonary - History of COPD. On room air this am. Chest tube with 60 cc last 24 hours. Chest tube is to water seal, continues to have an air leak with cough. CXR this am appears to show stable right pneumothorax and subcutaneous emphysema right lateral chest wall/neck. Chest tube to remain. May need to consider placing to mini express in next several days if air leak does  not resolve. Encourage incentive spirometer and flutter valve.  3. On Lovenox for DVT prophylaxis 4. Expected post op blood loss anemia-H and H this am decreased to 10.4 and 32  Donielle M ZimmermanPA-C 12/27/2022,7:04 AM  Agree with above Leak with cough Continue pulm hygiene  Brittainy Bucker O Yazlyn Wentzel

## 2022-12-27 NOTE — Progress Notes (Signed)
Mobility Specialist Progress Note:   12/27/22 1142  Mobility  Activity Ambulated with assistance in hallway  Level of Assistance Contact guard assist, steadying assist  Assistive Device None  Distance Ambulated (ft) 400 ft  Activity Response Tolerated well  Mobility Referral Yes  $Mobility charge 1 Mobility  Mobility Specialist Start Time (ACUTE ONLY) 1125  Mobility Specialist Stop Time (ACUTE ONLY) 1140  Mobility Specialist Time Calculation (min) (ACUTE ONLY) 15 min   Pre Mobility: 88 HR , 97% SpO2 During Mobility: 105 HR , 98% SpO2 Post Mobility: 94 HR ,143/90 BP, 97% SpO2  Pt received in bed, agreeable to mobility. C/o slight lightheadedness during ambulation, otherwise asymptomatic throughout. Pt left in chair with call bell in reach and all needs met.  Leory Plowman  Mobility Specialist Please contact via [employer] office at [phone]

## 2022-12-28 ENCOUNTER — Inpatient Hospital Stay (HOSPITAL_COMMUNITY): Payer: PPO

## 2022-12-28 NOTE — Plan of Care (Signed)
Problem: Activity: Goal: Risk for activity intolerance will decrease Outcome: Progressing   Problem: Cardiac: Goal: Will achieve and/or maintain hemodynamic stability Outcome: Progressing   Problem: Respiratory: Goal: Respiratory status will improve Outcome: Progressing   Problem: Pain Management: Goal: Pain level will decrease Outcome: Progressing

## 2022-12-28 NOTE — Progress Notes (Addendum)
      [address]       [name] [address]             [phone]       5 Days Post-Op Procedure(s) (LRB): XI ROBOTIC ASSISTED THORACOSCOPY-RIGHT UPPER LOBECTOMY (Right)  Subjective: Patient states dressing on IV "moving". He did walk two times yesterdayl  Objective: Vital signs in last 24 hours: Temp:  [98.3 F (36.8 C)-98.9 F (37.2 C)] 98.8 F (37.1 C) (09/21 0810) Pulse Rate:  [77-92] 82 (09/21 0810) Cardiac Rhythm: Normal sinus rhythm (09/21 0700) Resp:  [13-18] 18 (09/21 0810) BP: (134-148)/(74-93) 135/84 (09/21 0810) SpO2:  [90 %-98 %] 94 % (09/21 0810)     Intake/Output from previous day: 09/20 0701 - 09/21 0700 In: 1080 [P.O.:1080] Out: 2940 [Urine:2850; Chest Tube:90]   Physical Exam:  Cardiovascular: RRR, Pulmonary: Clear to auscultation on left and coarse/rub on right Abdomen: Soft, non tender, bowel sounds present. Extremities: No lower extremity edema. Wounds: Clean and dry.  No erythema or signs of infection. Chest Tube: To water seal, +++ air leak with cough  Lab Results: CBC: No results for input(s): "WBC", "HGB", "HCT", "PLT" in the last 72 hours.  BMET:  No results for input(s): "NA", "K", "CL", "CO2", "GLUCOSE", "BUN", "CREATININE", "CALCIUM" in the last 72 hours.   PT/INR: No results for input(s): "LABPROT", "INR" in the last 72 hours. ABG:  INR: Will add last result for INR, ABG once components are confirmed Will add last 4 CBG results once components are confirmed  Assessment/Plan:  1. CV - SR this am. 2.  Pulmonary - History of COPD. On room air this am. Chest tube with 90 cc last 24 hours. Chest tube is to water seal, continues to have an air leak with cough. CXR this am appears to show stable right pneumothorax and subcutaneous emphysema right lateral chest wall/neck. Chest tube to remain. May need to consider placing to mini express in next several days if air leak does not resolve. Encourage incentive  spirometer and flutter valve.  3. On Lovenox for DVT prophylaxis 4. Expected post op blood loss anemia-H and H this am decreased to 10.4 and 32 5. Care order to change IV dressing and change chest tube dressing  Donielle M ZimmermanPA-C 12/28/2022,9:30 AM   Chart reviewed, patient examined, agree with above. CXR looks ok. Minimal to no ptx. There is still an air leak. Continue to water seal.

## 2022-12-29 ENCOUNTER — Inpatient Hospital Stay (HOSPITAL_COMMUNITY): Payer: PPO

## 2022-12-29 MED ORDER — BISOPROLOL FUMARATE 5 MG PO TABS
5.0000 mg | ORAL_TABLET | Freq: Every day | ORAL | Status: DC
  Administered 2022-12-29: 5 mg via ORAL
  Filled 2022-12-29 (×2): qty 1

## 2022-12-29 NOTE — Plan of Care (Signed)
  Problem: Respiratory: Goal: Respiratory status will improve Outcome: Progressing   Problem: Pain Management: Goal: Pain level will decrease Outcome: Progressing   Problem: Skin Integrity: Goal: Wound healing without signs and symptoms infection will improve Outcome: Progressing   Problem: Health Behavior/Discharge Planning: Goal: Ability to manage health-related needs will improve Outcome: Progressing   Problem: Clinical Measurements: Goal: Will remain free from infection Outcome: Progressing Goal: Respiratory complications will improve Outcome: Progressing Goal: Cardiovascular complication will be avoided Outcome: Progressing   Problem: Activity: Goal: Risk for activity intolerance will decrease Outcome: Progressing   Problem: Nutrition: Goal: Adequate nutrition will be maintained Outcome: Progressing   Problem: Coping: Goal: Level of anxiety will decrease Outcome: Progressing

## 2022-12-29 NOTE — Progress Notes (Signed)
Mobility Specialist Progress Note:   12/29/22 0900  Mobility  Activity Ambulated independently in hallway  Level of Assistance Modified independent, requires aide device or extra time  Assistive Device None  Distance Ambulated (ft) 340 ft  Activity Response Tolerated well  Mobility Referral Yes  $Mobility charge 1 Mobility  Mobility Specialist Start Time (ACUTE ONLY) [id number]  Mobility Specialist Stop Time (ACUTE ONLY) 0933  Mobility Specialist Time Calculation (min) (ACUTE ONLY) 9 min    During Mobility: 128 HR,  94% SpO2 Post Mobility:  125 HR,  95% SpO2  Received pt in chair having no complaints and agreeable to mobility. Pt was asymptomatic throughout ambulation and returned to room w/o fault. Left in chair w/ call bell in reach and all needs met.   D'Vante Earlene Plater Mobility Specialist Please contact via Special educational needs teacher or Rehab office at [phone]

## 2022-12-29 NOTE — Progress Notes (Addendum)
      [address]       [name] [address]             [phone]       6 Days Post-Op Procedure(s) (LRB): XI ROBOTIC ASSISTED THORACOSCOPY-RIGHT UPPER LOBECTOMY (Right)  Subjective: Patient sitting in chair. No complaints.  Objective: Vital signs in last 24 hours: Temp:  [98 F (36.7 C)-98.9 F (37.2 C)] 98.4 F (36.9 C) (09/22 0826) Pulse Rate:  [75-86] 86 (09/22 0826) Cardiac Rhythm: Sinus tachycardia (09/22 0700) Resp:  [10-20] 20 (09/22 0826) BP: (114-163)/(71-90) 149/76 (09/22 0826) SpO2:  [94 %-98 %] 94 % (09/22 0826)     Intake/Output from previous day: 09/21 0701 - 09/22 0700 In: 1440 [P.O.:1440] Out: 1860 [Urine:1700; Chest Tube:160]   Physical Exam:  Cardiovascular: Tachycardic Pulmonary: Diminished breath sounds Abdomen: Soft, non tender, bowel sounds present. Extremities: No lower extremity edema. Wounds: Clean and dry.  No erythema or signs of infection. Chest Tube: To water seal, +++ air leak with cough  Lab Results: CBC: No results for input(s): "WBC", "HGB", "HCT", "PLT" in the last 72 hours.  BMET:  No results for input(s): "NA", "K", "CL", "CO2", "GLUCOSE", "BUN", "CREATININE", "CALCIUM" in the last 72 hours.   PT/INR: No results for input(s): "LABPROT", "INR" in the last 72 hours. ABG:  INR: Will add last result for INR, ABG once components are confirmed Will add last 4 CBG results once components are confirmed  Assessment/Plan:  1. CV - Patient with tachycardia and more hypertensive. Will start low dose Bisoprolol. 2.  Pulmonary - History of COPD. On room air this am. Chest tube with 160 cc last 24 hours. Chest tube is to water seal, continues to have an air leak with cough. CXR this am appears to show stable right pneumothorax and subcutaneous emphysema right lateral chest wall/neck. Chest tube to remain. Of note, patient really does not want to go home with chest tube (mini express). Will discuss with Dr. Cliffton Asters  in am. Check CXR in am. Encourage incentive spirometer and flutter valve.  3. On Lovenox for DVT prophylaxis 4. Expected post op blood loss anemia-H and H this am decreased to 10.4 and 32 5. Deconditioned-ambulate tid  Lelon Huh ZimmermanPA-C 12/29/2022,9:00 AM    Chart reviewed, patient examined, agree with above. CXR looks good. Still has small air leak with cough. Will continue to water seal.

## 2022-12-30 ENCOUNTER — Inpatient Hospital Stay (HOSPITAL_COMMUNITY): Payer: PPO

## 2022-12-30 ENCOUNTER — Other Ambulatory Visit: Payer: Self-pay | Admitting: *Deleted

## 2022-12-30 MED ORDER — BISOPROLOL FUMARATE 10 MG PO TABS
10.0000 mg | ORAL_TABLET | Freq: Every day | ORAL | Status: DC
  Administered 2022-12-30 – 2023-01-06 (×8): 10 mg via ORAL
  Filled 2022-12-30 (×8): qty 1

## 2022-12-30 NOTE — Plan of Care (Signed)

## 2022-12-30 NOTE — Plan of Care (Signed)
  Problem: Education: Goal: Knowledge of disease or condition will improve 12/30/2022 0444 by Langley Gauss, RN Outcome: Progressing 12/30/2022 0444 by Langley Gauss, RN Outcome: Progressing Goal: Knowledge of the prescribed therapeutic regimen will improve Outcome: Progressing   Problem: Activity: Goal: Risk for activity intolerance will decrease Outcome: Progressing   Problem: Cardiac: Goal: Will achieve and/or maintain hemodynamic stability Outcome: Progressing   Problem: Clinical Measurements: Goal: Postoperative complications will be avoided or minimized Outcome: Progressing   Problem: Respiratory: Goal: Respiratory status will improve Outcome: Progressing   Problem: Pain Management: Goal: Pain level will decrease Outcome: Progressing   Problem: Skin Integrity: Goal: Wound healing without signs and symptoms infection will improve Outcome: Progressing   Problem: Education: Goal: Knowledge of General Education information will improve Description: Including pain rating scale, medication(s)/side effects and non-pharmacologic comfort measures Outcome: Progressing   Problem: Health Behavior/Discharge Planning: Goal: Ability to manage health-related needs will improve Outcome: Progressing   Problem: Clinical Measurements: Goal: Ability to maintain clinical measurements within normal limits will improve Outcome: Progressing Goal: Will remain free from infection Outcome: Progressing Goal: Diagnostic test results will improve Outcome: Progressing Goal: Respiratory complications will improve Outcome: Progressing Goal: Cardiovascular complication will be avoided Outcome: Progressing   Problem: Activity: Goal: Risk for activity intolerance will decrease Outcome: Progressing   Problem: Nutrition: Goal: Adequate nutrition will be maintained Outcome: Progressing   Problem: Coping: Goal: Level of anxiety will decrease Outcome: Progressing   Problem:  Elimination: Goal: Will not experience complications related to bowel motility Outcome: Progressing Goal: Will not experience complications related to urinary retention Outcome: Progressing   Problem: Pain Managment: Goal: General experience of comfort will improve Outcome: Progressing   Problem: Safety: Goal: Ability to remain free from injury will improve Outcome: Progressing   Problem: Skin Integrity: Goal: Risk for impaired skin integrity will decrease Outcome: Progressing

## 2022-12-30 NOTE — Progress Notes (Signed)
Mobility Specialist Progress Note:   12/30/22 0900  Mobility  Activity Ambulated independently in hallway  Level of Assistance Modified independent, requires aide device or extra time  Assistive Device None  Distance Ambulated (ft) 475 ft  Activity Response Tolerated well  Mobility Referral Yes  $Mobility charge 1 Mobility  Mobility Specialist Start Time (ACUTE ONLY) 0920  Mobility Specialist Stop Time (ACUTE ONLY) 0931  Mobility Specialist Time Calculation (min) (ACUTE ONLY) 11 min    Pre Mobility: 82 HR,  97% SpO2 During Mobility: 92 HR,  97% SpO2 Post Mobility:  90 HR,  95% SpO2  Received pt in chair having no complaints and agreeable to mobility. Pt was asymptomatic throughout ambulation and returned to room w/o fault. Left in chair w/ call bell in reach and all needs met.   D'Vante Earlene Plater Mobility Specialist Please contact via Special educational needs teacher or Rehab office at [phone]

## 2022-12-30 NOTE — TOC Progression Note (Addendum)
Transition of Care (TOC) - Progression Note    Patient Details  Name: Jeremy Harrell. MRN: [id number] Date of Birth: 1950/12/26  Transition of Care [facility]) CM/SW Contact  Leone Haven, RN Phone Number: 12/30/2022, 10:02 AM  Clinical Narrative:    Per MD note for today,continues with persistent air leak, chest remains to suction. TOC following.   Expected Discharge Plan: Home/Self Care Barriers to Discharge: Continued Medical Work up  Expected Discharge Plan and Services In-house Referral: NA Discharge Planning Services: CM Consult Post Acute Care Choice: NA Living arrangements for the past 2 months: Single Family Home                 DME Arranged: N/A DME Agency: NA       HH Arranged: NA           Social Determinants of Health (SDOH) Interventions SDOH Screenings   Food Insecurity: No Food Insecurity (12/23/2022)  Housing: Low Risk  (12/23/2022)  Transportation Needs: No Transportation Needs (12/23/2022)  Utilities: Not At Risk (12/23/2022)  Depression (PHQ2-9): Low Risk  (10/28/2022)  Financial Resource Strain: Low Risk  (06/10/2022)   Received from [facility], [facility]  Tobacco Use: Medium Risk (12/23/2022)    Readmission Risk Interventions     No data to display

## 2022-12-30 NOTE — Progress Notes (Addendum)
      [address]       [place] [address]             [phone]      7 Days Post-Op Procedure(s) (LRB): XI ROBOTIC ASSISTED THORACOSCOPY-RIGHT UPPER LOBECTOMY (Right)  Subjective:  Patient awoken from sleep.  No new complaints.  Objective: Vital signs in last 24 hours: Temp:  [98.2 F (36.8 C)-99.8 F (37.7 C)] 98.3 F (36.8 C) (09/23 0700) Pulse Rate:  [70-90] 72 (09/23 0337) Cardiac Rhythm: Normal sinus rhythm (09/22 1901) Resp:  [12-20] 14 (09/23 0700) BP: (110-149)/(70-121) 130/70 (09/23 0700) SpO2:  [94 %-98 %] 98 % (09/23 0700)  Intake/Output from previous day: 09/22 0701 - 09/23 0700 In: 358 [P.O.:358] Out: 1575 [Urine:1325; Chest Tube:250]  General appearance: alert, cooperative, and no distress Heart: regular rate and rhythm Lungs: diminished breath sounds on right Abdomen: soft, non-tender; bowel sounds normal; no masses,  no organomegaly Extremities: extremities normal, atraumatic, no cyanosis or edema Wound: clean, + serous drainage around chest tube  Lab Results: No results for input(s): "WBC", "HGB", "HCT", "PLT" in the last 72 hours. BMET: No results for input(s): "NA", "K", "CL", "CO2", "GLUCOSE", "BUN", "CREATININE", "CALCIUM" in the last 72 hours.  PT/INR: No results for input(s): "LABPROT", "INR" in the last 72 hours. ABG No results found for: "PHART", "HCO3", "TCO2", "ACIDBASEDEF", "O2SAT" CBG (last 3)  Recent Labs    12/27/22 2122  GLUCAP 114*    Assessment/Plan: S/P Procedure(s) (LRB): XI ROBOTIC ASSISTED THORACOSCOPY-RIGHT UPPER LOBECTOMY (Right)  CV- NSR, BP improved- will increase Bisprolol to 10 mg daily after episode of non sustained VT this morning Pulm- persistent air leak remains.. CT on water seal, continues to drain around chest tube site, recorded output is 250 cc.. CXR shows stable sub q emphysema along right chest.. pneumothorax appears improved.[name] Kitchen leave chest tube in place.. patient does not wish to go  home with CT in place, ? Bronchial valves? DVT prophylaxis- continue Lovenox Dispo- patient stable, air leak persists, continue chest tube management   LOS: 7 days    Jeremy Dandy, PA-C 12/30/2022  Agree with above Air leak with cough Will place CT to suction Pulm Hygiene  Jeremy Harrell

## 2022-12-31 ENCOUNTER — Inpatient Hospital Stay (HOSPITAL_COMMUNITY): Payer: PPO

## 2022-12-31 NOTE — Progress Notes (Signed)
      [address]       [place] [address]             [phone]      8 Days Post-Op Procedure(s) (LRB): XI ROBOTIC ASSISTED THORACOSCOPY-RIGHT UPPER LOBECTOMY (Right)  Subjective:  Patient is without new complaints.  Objective: Vital signs in last 24 hours: Temp:  [98.4 F (36.9 C)-99.3 F (37.4 C)] 99.3 F (37.4 C) (09/24 0439) Pulse Rate:  [62-80] 75 (09/24 0439) Cardiac Rhythm: Normal sinus rhythm;Bundle branch block (09/24 0700) Resp:  [13-21] 20 (09/24 0439) BP: (107-121)/(63-78) 121/78 (09/24 0439) SpO2:  [92 %-98 %] 96 % (09/24 0439)  Intake/Output from previous day: 09/23 0701 - 09/24 0700 In: 1440 [P.O.:1440] Out: 1710 [Urine:1400; Chest Tube:310]  General appearance: alert, cooperative, and no distress Heart: regular rate and rhythm Lungs: diminished breath sounds on right Abdomen: soft, non-tender; bowel sounds normal; no masses,  no organomegaly Extremities: extremities normal, atraumatic, no cyanosis or edema Wound: clean and dry, serous drainage from around chest tube  Lab Results: No results for input(s): "WBC", "HGB", "HCT", "PLT" in the last 72 hours. BMET: No results for input(s): "NA", "K", "CL", "CO2", "GLUCOSE", "BUN", "CREATININE", "CALCIUM" in the last 72 hours.  PT/INR: No results for input(s): "LABPROT", "INR" in the last 72 hours. ABG No results found for: "PHART", "HCO3", "TCO2", "ACIDBASEDEF", "O2SAT" CBG (last 3)  No results for input(s): "GLUCAP" in the last 72 hours.  Assessment/Plan: S/P Procedure(s) (LRB): XI ROBOTIC ASSISTED THORACOSCOPY-RIGHT UPPER LOBECTOMY (Right)  CV- NSR, BP stable- continue Bisoprolol at 10 Pulm- air leak persists, larger today than yesterday, unfortunately CT was not placed back on suction yesterday as ordered.. CXR shows stable pneumothorax, sub q emphysema... will place chest tube to suction today, patient does not wish to go home with a chest tube in place DVT prophylaxis continue  Lovenox Dispo- patient stable, persistent air leak, worse today in comparison to yesterday, will place to suction as ordered, unfortunately he was left on water seal yesterday, will repeat CXR, labs in AM   LOS: 8 days    Jeremy Dandy, PA-C 12/31/2022

## 2022-12-31 NOTE — Plan of Care (Signed)

## 2022-12-31 NOTE — Progress Notes (Signed)
Right chest tube placed to 40 of suiction by Cliffton Asters, MD, instructed my Lowella Dandy, PA to leave CT to 40 of suction.   Sherilyn Banker, RN

## 2022-12-31 NOTE — Progress Notes (Signed)
Chest tube placed to 10 of suction during rounding this morning as well as dressing changed at the bedside with Lowella Dandy, PA. CT left to water seal overnight despite suction order. PA recommended xeroform, 1 drainage sponge, and 4 x 4 gauze for future dressing changes.    Sherilyn Banker, RN

## 2023-01-01 ENCOUNTER — Inpatient Hospital Stay (HOSPITAL_COMMUNITY): Payer: PPO

## 2023-01-01 LAB — BASIC METABOLIC PANEL
Anion gap: 7 (ref 5–15)
BUN: 10 mg/dL (ref 8–23)
CO2: 25 mmol/L (ref 22–32)
Calcium: 8.5 mg/dL — ABNORMAL LOW (ref 8.9–10.3)
Chloride: 103 mmol/L (ref 98–111)
Creatinine, Ser: 0.87 mg/dL (ref 0.61–1.24)
GFR, Estimated: >60 mL/min (ref >60)
Glucose, Bld: 130 mg/dL — ABNORMAL HIGH (ref 70–99)
Potassium: 3.5 mmol/L (ref 3.5–5.1)
Sodium: 135 mmol/L (ref 135–145)

## 2023-01-01 LAB — CBC
HCT: 33 % — ABNORMAL LOW (ref 39.0–52.0)
Hemoglobin: 10.9 g/dL — ABNORMAL LOW (ref 13.0–17.0)
MCH: 31.6 pg (ref 26.0–34.0)
MCHC: 33 g/dL (ref 30.0–36.0)
MCV: 95.7 fL (ref 80.0–100.0)
Platelets: 420 10*3/uL — ABNORMAL HIGH (ref 150–400)
RBC: 3.45 MIL/uL — ABNORMAL LOW (ref 4.22–5.81)
RDW: 12.6 % (ref 11.5–15.5)
WBC: 14.2 10*3/uL — ABNORMAL HIGH (ref 4.0–10.5)
nRBC: 0 % (ref 0.0–0.2)

## 2023-01-01 MED ORDER — FENTANYL CITRATE PF 50 MCG/ML IJ SOSY
PREFILLED_SYRINGE | INTRAMUSCULAR | Status: AC
  Administered 2023-01-01: 50 ug
  Filled 2023-01-01: qty 1

## 2023-01-01 MED ORDER — FENTANYL CITRATE PF 50 MCG/ML IJ SOSY
50.0000 ug | PREFILLED_SYRINGE | Freq: Once | INTRAMUSCULAR | Status: AC | PRN
  Administered 2023-01-01: 50 ug via INTRAVENOUS
  Filled 2023-01-01: qty 1

## 2023-01-01 MED ORDER — FENTANYL CITRATE PF 50 MCG/ML IJ SOSY: 50.0000 ug | PREFILLED_SYRINGE | Freq: Once | INTRAMUSCULAR | Status: AC

## 2023-01-01 MED ORDER — LIDOCAINE HCL (PF) 1 % IJ SOLN
30.0000 mL | Freq: Once | INTRAMUSCULAR | Status: DC
  Filled 2023-01-01: qty 30

## 2023-01-01 NOTE — Procedures (Signed)
      [address]       [name] [address]             [phone]      Chest Tube Insertion Procedure Note - bedside  Indications:  Clinically significant Pneumothorax  Pre-operative Diagnosis: Pneumothorax  Post-operative Diagnosis: Pneumothorax  Procedure Details  Informed consent was obtained for the procedure, including sedation.  Risks of lung perforation, hemorrhage, arrhythmia, and adverse drug reaction were discussed.   After sterile skin prep, using standard technique, a pigtail chest tube was placed in the right 5th ICS.  Findings: None  Estimated Blood Loss:  Minimal         Specimens:  None              Complications:  None; patient tolerated the procedure well         Condition: stable  Attending Attestation:   Jenny Reichmann, PA-C

## 2023-01-01 NOTE — Progress Notes (Signed)
      [address]       [place] [address]             [phone]      9 Days Post-Op Procedure(s) (LRB): XI ROBOTIC ASSISTED THORACOSCOPY-RIGHT UPPER LOBECTOMY (Right)  Subjective:  Patient sitting up eating breakfast.. When I walked in room chest tube is out and sitting on floor.  He has some pain along his right shoulder  Objective: Vital signs in last 24 hours: Temp:  [98.4 F (36.9 C)-99.3 F (37.4 C)] 99 F (37.2 C) (09/25 0549) Pulse Rate:  [68-89] 71 (09/25 0550) Cardiac Rhythm: Normal sinus rhythm;Bundle branch block (09/24 1942) Resp:  [15-20] 15 (09/25 0550) BP: (112-140)/(65-110) 116/73 (09/25 0550) SpO2:  [95 %-100 %] 96 % (09/25 0550)  Intake/Output from previous day: 09/24 0701 - 09/25 0700 In: 360 [P.O.:360] Out: 1495 [Urine:975; Chest Tube:520]  General appearance: alert, cooperative, and no distress Heart: regular rate and rhythm Lungs: diminished breath sounds on right,  + sucking air at right chest tube site Abdomen: soft, non-tender; bowel sounds normal; no masses,  no organomegaly Extremities: extremities normal, atraumatic, no cyanosis or edema Wound: clean, some minor erythema around chest tube site  Lab Results: Recent Labs    01/01/23 0218  WBC 14.2*  HGB 10.9*  HCT 33.0*  PLT 420*   BMET:  Recent Labs    01/01/23 0218  NA 135  K 3.5  CL 103  CO2 25  GLUCOSE 130*  BUN 10  CREATININE 0.87  CALCIUM 8.5*    PT/INR: No results for input(s): "LABPROT", "INR" in the last 72 hours. ABG No results found for: "PHART", "HCO3", "TCO2", "ACIDBASEDEF", "O2SAT" CBG (last 3)  No results for input(s): "GLUCAP" in the last 72 hours.  Assessment/Plan: S/P Procedure(s) (LRB): XI ROBOTIC ASSISTED THORACOSCOPY-RIGHT UPPER LOBECTOMY (Right)  CV- NSR, BP remains stable- continue Bisoprolol @ 10  Pulm- CT on the floor when entering room.. CXR taken prior still showed pneumothorax.. will get STAT repeat and assess need for  repeat CT placement ID- mild leukocytosis- monitor, no signs/symptoms of active infection Lovenox for DVT prophylaxis Dispo- patient stable, chest tube on the floor this morning upon entering room, STAT CXR ordered... will review and determine need for CT placement   LOS: 9 days    Lowella Dandy, PA-C 01/01/2023

## 2023-01-01 NOTE — Plan of Care (Signed)

## 2023-01-01 NOTE — Plan of Care (Signed)
  Problem: Education: Goal: Knowledge of disease or condition will improve Outcome: Progressing Goal: Knowledge of the prescribed therapeutic regimen will improve Outcome: Progressing   Problem: Activity: Goal: Risk for activity intolerance will decrease Outcome: Progressing   Problem: Cardiac: Goal: Will achieve and/or maintain hemodynamic stability Outcome: Progressing   Problem: Respiratory: Goal: Respiratory status will improve Outcome: Progressing   Problem: Pain Management: Goal: Pain level will decrease Outcome: Progressing   Problem: Clinical Measurements: Goal: Ability to maintain clinical measurements within normal limits will improve Outcome: Progressing

## 2023-01-01 NOTE — Progress Notes (Signed)
Patient's chest tube placed to 40 of suction per verbal order from Barrett, PA.   Sherilyn Banker, RN

## 2023-01-01 NOTE — Progress Notes (Signed)
      [address]       [place] [address]             [phone]      CXR reviewed shows increase in right sided pneumothorax.  He will require chest tube placement.    Lowella Dandy, PA-C 7:54 AM 01/01/23

## 2023-01-02 ENCOUNTER — Inpatient Hospital Stay: Payer: PPO | Attending: Oncology

## 2023-01-02 ENCOUNTER — Inpatient Hospital Stay (HOSPITAL_COMMUNITY): Payer: PPO

## 2023-01-02 ENCOUNTER — Other Ambulatory Visit: Payer: Self-pay

## 2023-01-02 NOTE — Progress Notes (Addendum)
      [address]       [place] [address]             [phone]       10 Days Post-Op Procedure(s) (LRB): XI ROBOTIC ASSISTED THORACOSCOPY-RIGHT UPPER LOBECTOMY (Right)  Subjective:  Patient doing okay.  Continues to have right sided shoulder pain, not unexpected with pneumothorax and chest tube in place  Objective: Vital signs in last 24 hours: Temp:  [98.5 F (36.9 C)-99.1 F (37.3 C)] 98.8 F (37.1 C) (09/26 0311) Pulse Rate:  [62-69] 67 (09/26 0311) Cardiac Rhythm: Normal sinus rhythm (09/25 2000) Resp:  [14-20] 17 (09/26 0311) BP: (121-138)/(70-93) 126/93 (09/26 0311) SpO2:  [95 %-100 %] 95 % (09/26 0311)  Intake/Output from previous day: 09/25 0701 - 09/26 0700 In: 240 [P.O.:240] Out: 1100 [Urine:850; Chest Tube:250]  General appearance: alert, cooperative, and no distress Heart: regular rate and rhythm Lungs: diminished breath sounds on right Abdomen: soft, non-tender; bowel sounds normal; no masses,  no organomegaly Extremities: extremities normal, atraumatic, no cyanosis or edema Wound: clean and dry  Lab Results: Recent Labs    01/01/23 0218  WBC 14.2*  HGB 10.9*  HCT 33.0*  PLT 420*   BMET:  Recent Labs    01/01/23 0218  NA 135  K 3.5  CL 103  CO2 25  GLUCOSE 130*  BUN 10  CREATININE 0.87  CALCIUM 8.5*    PT/INR: No results for input(s): "LABPROT", "INR" in the last 72 hours. ABG No results found for: "PHART", "HCO3", "TCO2", "ACIDBASEDEF", "O2SAT" CBG (last 3)  No results for input(s): "GLUCAP" in the last 72 hours.  Assessment/Plan: S/P Procedure(s) (LRB): XI ROBOTIC ASSISTED THORACOSCOPY-RIGHT UPPER LOBECTOMY (Right)  CV- maintaining NSR, BP stable- continue Bisoprolol Pulm- persistent right pneumothorax, Pigtail placed yesterday with some improvement.. CT is on suction with 1+ air leak.. decrease suction to 30 cm today Lovenox for DVT prophylaxis Dispo- patient stable, persistent 1+ airleak and pneumothorax  on CXR....patient is not comfortable going home with chest tube in place.[name] Kitchen leave chest tube to suction today, repeat CXR in AM   LOS: 10 days    Lowella Dandy, PA-C 01/02/2023   Small leak Continue CT to suction If not resolved over weekend, will place valves on Monday  Jeremy Harrell

## 2023-01-02 NOTE — Progress Notes (Signed)
Mobility Specialist Progress Note:   01/02/23 0900  Mobility  Activity Ambulated independently in hallway  Level of Assistance Modified independent, requires aide device or extra time  Assistive Device None  Distance Ambulated (ft) 340 ft  Activity Response Tolerated well  Mobility Referral Yes  $Mobility charge 1 Mobility  Mobility Specialist Start Time (ACUTE ONLY) [phone]  Mobility Specialist Stop Time (ACUTE ONLY) 0937  Mobility Specialist Time Calculation (min) (ACUTE ONLY) 10 min    Pre Mobility: 59 HR,  97% SpO2 During Mobility: 75 HR,  91% SpO2 Post Mobility:  72 HR, 99% SpO2  Pt received in bed, agreeable to mobility. Asymptomatic w/ no complaints during session. Pt left in bed with call bell and all needs met. Chest tube on suction.  D'Vante Earlene Plater Mobility Specialist Please contact via Special educational needs teacher or Rehab office at [phone]

## 2023-01-03 ENCOUNTER — Inpatient Hospital Stay (HOSPITAL_COMMUNITY): Payer: PPO

## 2023-01-03 NOTE — Progress Notes (Signed)
Mobility Specialist Progress Note:   01/03/23 1000  Mobility  Activity Ambulated independently in hallway  Level of Assistance Independent  Assistive Device None  Distance Ambulated (ft) 475 ft  Activity Response Tolerated well  Mobility Referral Yes  $Mobility charge 1 Mobility  Mobility Specialist Start Time (ACUTE ONLY) 1010  Mobility Specialist Stop Time (ACUTE ONLY) 1021  Mobility Specialist Time Calculation (min) (ACUTE ONLY) 11 min    Pre Mobility: 61 HR During Mobility: 75 HR Post Mobility:  73 HR  Pt received in bed, agreeable to mobility. Asymptomatic throughout w/ no complaints. Pt left in bed with call bell and all needs met. Chest tube on suction.  D'Vante Earlene Plater Mobility Specialist Please contact via Special educational needs teacher or Rehab office at [phone]

## 2023-01-03 NOTE — Progress Notes (Deleted)
In error

## 2023-01-03 NOTE — Progress Notes (Addendum)
      [address]       [place] [address]             [phone]      11 Days Post-Op Procedure(s) (LRB): XI ROBOTIC ASSISTED THORACOSCOPY-RIGHT UPPER LOBECTOMY (Right)  Subjective:  Patient sitting up eating breakfast.  No complaints  Objective: Vital signs in last 24 hours: Temp:  [98.3 F (36.8 C)-99 F (37.2 C)] 98.3 F (36.8 C) (09/27 0218) Pulse Rate:  [62-69] 69 (09/27 0218) Cardiac Rhythm: Normal sinus rhythm;Bundle branch block (09/26 1900) Resp:  [17-19] 18 (09/27 0218) BP: (96-138)/(66-103) 133/89 (09/27 0218) SpO2:  [96 %-98 %] 96 % (09/27 0218)  Intake/Output from previous day: 09/26 0701 - 09/27 0700 In: 1560 [P.O.:1260] Out: 2012 [Urine:1600; Chest Tube:412]  General appearance: alert, cooperative, and no distress Heart: regular rate and rhythm Lungs: diminished on right Abdomen: soft, non-tender; bowel sounds normal; no masses,  no organomegaly Extremities: extremities normal, atraumatic, no cyanosis or edema Wound: clean and dry  Lab Results: Recent Labs    01/01/23 0218  WBC 14.2*  HGB 10.9*  HCT 33.0*  PLT 420*   BMET:  Recent Labs    01/01/23 0218  NA 135  K 3.5  CL 103  CO2 25  GLUCOSE 130*  BUN 10  CREATININE 0.87  CALCIUM 8.5*    PT/INR: No results for input(s): "LABPROT", "INR" in the last 72 hours. ABG No results found for: "PHART", "HCO3", "TCO2", "ACIDBASEDEF", "O2SAT" CBG (last 3)  No results for input(s): "GLUCAP" in the last 72 hours.  Assessment/Plan: S/P Procedure(s) (LRB): XI ROBOTIC ASSISTED THORACOSCOPY-RIGHT UPPER LOBECTOMY (Right)  CV- maintaining NSR, BP stable- continue Bisoprolol Pulm- persistent right pneumothorax, pigtail in place... air leak appears resolved.. will decrease suction to 20 cm. Lovenox- DVT prophylaxis Dispo- patient stable, air leak appears resolved, CXR remains stable, will decrease to 20 cm suction today, possibly water seal tomorrow.. Repeat CXR in AM   LOS: 11  days   Lowella Dandy, PA-C 01/03/2023  Patient seen and examined, agree with findings and plan outlined above with the exception that he does have air leak at time I examined him. Will leave on -20 cm suction  Viviann Spare C. Dorris Fetch, MD [facility] [phone]

## 2023-01-03 NOTE — Progress Notes (Signed)
The proposed treatment discussed in conference is for discussion purpose only and is not a binding recommendation.  The patients have not been physically examined, or presented with their treatment options.  Therefore, final treatment plans cannot be decided.  

## 2023-01-03 NOTE — Plan of Care (Signed)

## 2023-01-04 ENCOUNTER — Inpatient Hospital Stay (HOSPITAL_COMMUNITY): Payer: PPO

## 2023-01-04 NOTE — Progress Notes (Signed)
CT removed per order. Tolerated fine. Vitals stable. Educate pt to call if increased pain, SOB or any other issues.

## 2023-01-04 NOTE — Progress Notes (Signed)
Mobility Specialist Progress Note:   01/04/23 1331  Mobility  Activity Ambulated with assistance in hallway  Level of Assistance Contact guard assist, steadying assist  Assistive Device None  Activity Response Tolerated well  Mobility Referral Yes  $Mobility charge 1 Mobility  Mobility Specialist Start Time (ACUTE ONLY) 1240  Mobility Specialist Stop Time (ACUTE ONLY) 1250  Mobility Specialist Time Calculation (min) (ACUTE ONLY) 10 min   Pre Mobility: 66 HR  During Mobility: 72 HR Post Mobility: 57 HR  Pt received in bed, agreeable to mobility. Denied any discomfort during ambulation, asymptomatic throughout. Pt returned to bed with call bell in reach and all needs met.  Leory Plowman  Mobility Specialist Please contact via [employer] office at [phone]

## 2023-01-04 NOTE — Progress Notes (Addendum)
      [address]       [place] [address]             [phone]     12 Days Post-Op Procedure(s) (LRB): XI ROBOTIC ASSISTED THORACOSCOPY-RIGHT UPPER LOBECTOMY (Right) Subjective: Pigtail is no longer in chest  Objective: Vital signs in last 24 hours: Temp:  [98.2 F (36.8 C)-99.5 F (37.5 C)] 99.5 F (37.5 C) (09/28 0752) Pulse Rate:  [60-67] 67 (09/28 0445) Cardiac Rhythm: Sinus bradycardia;Bundle branch block (09/28 0700) Resp:  [15-24] 17 (09/28 0752) BP: (97-125)/(69-84) 97/82 (09/28 0752) SpO2:  [96 %-97 %] 97 % (09/28 0752)  Hemodynamic parameters for last 24 hours:    Intake/Output from previous day: 09/27 0701 - 09/28 0700 In: 850 [P.O.:850] Out: 1270 [Urine:1050; Chest Tube:220] Intake/Output this shift: No intake/output data recorded.  General appearance: alert, cooperative, and no distress Heart: regular rate and rhythm Lungs: clear to auscultation bilaterally Abdomen: benign Extremities: no edema or calf tenderness Wound: incis healing well  Lab Results: No results for input(s): "WBC", "HGB", "HCT", "PLT" in the last 72 hours. BMET: No results for input(s): "NA", "K", "CL", "CO2", "GLUCOSE", "BUN", "CREATININE", "CALCIUM" in the last 72 hours.  PT/INR: No results for input(s): "LABPROT", "INR" in the last 72 hours. ABG No results found for: "PHART", "HCO3", "TCO2", "ACIDBASEDEF", "O2SAT" CBG (last 3)  No results for input(s): "GLUCAP" in the last 72 hours.  Meds Scheduled Meds:  aspirin EC  81 mg Oral Daily   atorvastatin  80 mg Oral Daily   bisacodyl  10 mg Oral Daily   bisoprolol  10 mg Oral Daily   enoxaparin (LOVENOX) injection  40 mg Subcutaneous Q24H   FLUoxetine  40 mg Oral Daily   lidocaine (PF)  30 mL Intradermal Once   multivitamin with minerals  1 tablet Oral Daily   pantoprazole  40 mg Oral Daily   senna-docusate  1 tablet Oral QHS   Continuous Infusions: PRN Meds:.ipratropium-albuterol, ondansetron (ZOFRAN)  IV, oxyCODONE, traMADol  Xrays DG CHEST PORT 1 VIEW  Result Date: 01/03/2023 CLINICAL DATA:  Right pneumothorax. EXAM: PORTABLE CHEST 1 VIEW COMPARISON:  January 02, 2023. FINDINGS: Stable position of right basilar chest tube. Stable small right apical pneumothorax. Stable subcutaneous emphysema seen over right lateral chest wall. Left lung is clear. IMPRESSION: Stable small right apical pneumothorax. Stable right chest tube. Stable subcutaneous emphysema over right lateral chest wall. Electronically Signed   By: Lupita Raider M.D.   On: 01/03/2023 10:50    Assessment/Plan: S/P Procedure(s) (LRB): XI ROBOTIC ASSISTED THORACOSCOPY-RIGHT UPPER LOBECTOMY (Right)  1 afeb, Tmax 99.5, VSS 2 O2 sats good on RA 3 CT 220 ml/24 h 4 good UOP 5 no new labs 6 CXR- increased right pntx w/ tube out- will need replace 7 chest tube to be replaced 8 routine pupm hygiene and rehab 9 lovenox for DVT ppx    LOS: 12 days    Rowe Clack PA-C Pager [phone]   01/04/2023 Patient seen and examined, agree with above Pigtail is out of chest. CXR shows a larger pneumo/ space.  He is tolerating it without symptoms currently. Will remove catheter as it is not useful and repeat a CXR this afternoon.  If increased pneumo will need a new tube placed D/w RN, pt understands to notify us immediately if increased pain, shortness of breath or any other issues  Viviann Spare C. Dorris Fetch, MD [facility] [phone]

## 2023-01-04 NOTE — Plan of Care (Signed)
  Problem: Activity: Goal: Risk for activity intolerance will decrease Outcome: Progressing    Problem: Cardiac: Goal: Will achieve and/or maintain hemodynamic stability Outcome: Progressing   Problem: Clinical Measurements: Goal: Postoperative complications will be avoided or minimized Outcome: Progressing   Problem: Respiratory: Goal: Respiratory status will improve Outcome: Progressing     

## 2023-01-05 ENCOUNTER — Inpatient Hospital Stay (HOSPITAL_COMMUNITY): Payer: PPO

## 2023-01-05 NOTE — Progress Notes (Signed)
13 Days Post-Op Procedure(s) (LRB): XI ROBOTIC ASSISTED THORACOSCOPY-RIGHT UPPER LOBECTOMY (Right) Subjective: No complaints this morning  Objective: Vital signs in last 24 hours: Temp:  [98.5 F (36.9 C)-99.3 F (37.4 C)] 98.9 F (37.2 C) (09/29 0744) Pulse Rate:  [65-68] 65 (09/29 0744) Cardiac Rhythm: Normal sinus rhythm;Bundle branch block (09/29 0700) Resp:  [10-21] 17 (09/29 0744) BP: (102-139)/(56-75) 107/65 (09/29 0744) SpO2:  [93 %-99 %] 99 % (09/29 0744)  Hemodynamic parameters for last 24 hours:    Intake/Output from previous day: 09/28 0701 - 09/29 0700 In: 720 [P.O.:720] Out: 400 [Urine:400] Intake/Output this shift: Total I/O In: 120 [P.O.:120] Out: -   General appearance: alert, cooperative, and no distress Neurologic: intact Heart: regular rate and rhythm Lungs: diminished breath sounds on right  Lab Results: No results for input(s): "WBC", "HGB", "HCT", "PLT" in the last 72 hours. BMET: No results for input(s): "NA", "K", "CL", "CO2", "GLUCOSE", "BUN", "CREATININE", "CALCIUM" in the last 72 hours.  PT/INR: No results for input(s): "LABPROT", "INR" in the last 72 hours. ABG No results found for: "PHART", "HCO3", "TCO2", "ACIDBASEDEF", "O2SAT" CBG (last 3)  No results for input(s): "GLUCAP" in the last 72 hours.  Assessment/Plan: S/P Procedure(s) (LRB): XI ROBOTIC ASSISTED THORACOSCOPY-RIGHT UPPER LOBECTOMY (Right) - Persistent air leak- tube came out prior to yesterday's CXR Had an increased pneumothorax but was stable clinically Did well over night. CXR this AM shows stable pneumo/ space. Will continue to observe and repeat CXR tomorrow Home tomorrow if no issues   LOS: 13 days    Loreli Slot 01/05/2023

## 2023-01-05 NOTE — Plan of Care (Signed)
  Problem: Education: Goal: Knowledge of the prescribed therapeutic regimen will improve Outcome: Progressing   Problem: Activity: Goal: Risk for activity intolerance will decrease Outcome: Progressing   Problem: Cardiac: Goal: Will achieve and/or maintain hemodynamic stability Outcome: Progressing

## 2023-01-06 ENCOUNTER — Inpatient Hospital Stay (HOSPITAL_COMMUNITY): Payer: PPO

## 2023-01-06 MED ORDER — BISOPROLOL FUMARATE 10 MG PO TABS: 10.0000 mg | ORAL_TABLET | Freq: Every day | ORAL | 1 refills | Status: AC

## 2023-01-06 MED ORDER — OXYCODONE HCL 5 MG PO TABS: 5.0000 mg | ORAL_TABLET | ORAL | 0 refills | Status: DC | PRN

## 2023-01-06 NOTE — Progress Notes (Signed)
Mobility Specialist Progress Note:   01/06/23 0900  Mobility  Activity Ambulated independently in hallway  Level of Assistance Independent  Assistive Device None  Distance Ambulated (ft) 475 ft  Activity Response Tolerated well  Mobility Referral Yes  $Mobility charge 1 Mobility  Mobility Specialist Start Time (ACUTE ONLY) [phone]  Mobility Specialist Stop Time (ACUTE ONLY) 0839  Mobility Specialist Time Calculation (min) (ACUTE ONLY) 6 min    Pre Mobility: 64 HR During Mobility: 77 HR Post Mobility:  74 HR  Received pt in bed having no complaints and agreeable to mobility. Pt was asymptomatic throughout ambulation and returned to room w/o fault. Left in bed w/ call bell in reach and all needs met.   D'Vante Earlene Plater Mobility Specialist Please contact via Special educational needs teacher or Rehab office at [phone]

## 2023-01-06 NOTE — Progress Notes (Addendum)
      [address]       [place] [address]             [phone]      14 Days Post-Op Procedure(s) (LRB): XI ROBOTIC ASSISTED THORACOSCOPY-RIGHT UPPER LOBECTOMY (Right) Subjective: Patient with no new complaints  Objective: Vital signs in last 24 hours: Temp:  [98.2 F (36.8 C)-99.1 F (37.3 C)] 98.9 F (37.2 C) (09/30 0435) Pulse Rate:  [60-99] 99 (09/30 0435) Cardiac Rhythm: Sinus bradycardia;Bundle branch block (09/29 2019) Resp:  [15-20] 17 (09/30 0435) BP: (97-132)/(74-99) 115/99 (09/30 0435) SpO2:  [97 %-100 %] 97 % (09/30 0435)  Hemodynamic parameters for last 24 hours:    Intake/Output from previous day: 09/29 0701 - 09/30 0700 In: 1020 [P.O.:1020] Out: 200 [Urine:200] Intake/Output this shift: No intake/output data recorded.  General appearance: alert, cooperative, and no distress Neurologic: intact Heart: regular rate and rhythm, S1, S2 normal, no murmur, click, rub or gallop Lungs: diminished breath sounds on the right Abdomen: soft, non-tender; bowel sounds normal; no masses,  no organomegaly Extremities: extremities normal, atraumatic, no cyanosis or edema Wound: Clean and dry without sign of infection.   Lab Results: No results for input(s): "WBC", "HGB", "HCT", "PLT" in the last 72 hours. BMET: No results for input(s): "NA", "K", "CL", "CO2", "GLUCOSE", "BUN", "CREATININE", "CALCIUM" in the last 72 hours.  PT/INR: No results for input(s): "LABPROT", "INR" in the last 72 hours. ABG No results found for: "PHART", "HCO3", "TCO2", "ACIDBASEDEF", "O2SAT" CBG (last 3)  No results for input(s): "GLUCAP" in the last 72 hours.  Assessment/Plan: S/P Procedure(s) (LRB): XI ROBOTIC ASSISTED THORACOSCOPY-RIGHT UPPER LOBECTOMY (Right)  CV: NSR, HR 70s. BP controlled on Bisoprolol 10mg  daily.  Pulm: Saturating well on RA. Pigtail chest tube was out of chest on 09/28, CXR showed an increased right pneumothorax but the patient remained  stable clinically. Chest tube was removed. CXR with stable moderate to large right side pneumothorax. Encourage IS and ambulation.   GI: +BM 09/28  ID: Tmax 99.1, no obvious sign of infection  DVT Prophylaxis: Lovenox  Dispo: D/C to home today as discussed with Dr. Cliffton Asters. No one to pick him up until later this evening.    LOS: 14 days    Jenny Reichmann, PA-C 01/06/2023  CXR stable Home today  Matthe Sloane Keane Scrape

## 2023-01-06 NOTE — TOC Transition Note (Signed)
Transition of Care [facility]) - CM/SW Discharge Note   Patient Details  Name: Jeremy Vera Sr. MRN: [id number] Date of Birth: 17-Jun-1950  Transition of Care [facility]) CM/SW Contact:  Leone Haven, RN Phone Number: 01/06/2023, 10:07 AM   Clinical Narrative:    For dc today, he has no needs.    Final next level of care: Home/Self Care Barriers to Discharge: Continued Medical Work up   Patient Goals and CMS Choice   Choice offered to / list presented to : NA  Discharge Placement                         Discharge Plan and Services Additional resources added to the After Visit Summary for   In-house Referral: NA Discharge Planning Services: CM Consult Post Acute Care Choice: NA          DME Arranged: N/A DME Agency: NA       HH Arranged: NA          Social Determinants of Health (SDOH) Interventions SDOH Screenings   Food Insecurity: No Food Insecurity (12/30/2022)  Housing: Low Risk  (12/23/2022)  Transportation Needs: No Transportation Needs (12/23/2022)  Utilities: Not At Risk (12/23/2022)  Depression (PHQ2-9): Low Risk  (10/28/2022)  Financial Resource Strain: Low Risk  (06/10/2022)   Received from [facility]  Tobacco Use: Medium Risk (12/23/2022)     Readmission Risk Interventions     No data to display

## 2023-01-06 NOTE — Plan of Care (Signed)
Problem: Activity: Goal: Risk for activity intolerance will decrease Outcome: Progressing   Problem: Cardiac: Goal: Will achieve and/or maintain hemodynamic stability Outcome: Progressing   Problem: Respiratory: Goal: Respiratory status will improve Outcome: Progressing   Problem: Pain Management: Goal: Pain level will decrease Outcome: Progressing

## 2023-01-06 NOTE — Plan of Care (Signed)
Problem: Education: Goal: Knowledge of disease or condition will improve 01/06/2023 1057 by Garth Bigness, RN Outcome: Completed/Met 01/06/2023 1057 by Garth Bigness, RN Outcome: Adequate for Discharge Goal: Knowledge of the prescribed therapeutic regimen will improve 01/06/2023 1057 by Garth Bigness, RN Outcome: Completed/Met 01/06/2023 1057 by Garth Bigness, RN Outcome: Adequate for Discharge   Problem: Activity: Goal: Risk for activity intolerance will decrease 01/06/2023 1057 by Garth Bigness, RN Outcome: Completed/Met 01/06/2023 1057 by Garth Bigness, RN Outcome: Adequate for Discharge 01/06/2023 0753 by Garth Bigness, RN Outcome: Progressing   Problem: Cardiac: Goal: Will achieve and/or maintain hemodynamic stability 01/06/2023 1057 by Garth Bigness, RN Outcome: Completed/Met 01/06/2023 1057 by Garth Bigness, RN Outcome: Adequate for Discharge 01/06/2023 0753 by Garth Bigness, RN Outcome: Progressing   Problem: Clinical Measurements: Goal: Postoperative complications will be avoided or minimized 01/06/2023 1057 by Garth Bigness, RN Outcome: Completed/Met 01/06/2023 1057 by Garth Bigness, RN Outcome: Adequate for Discharge   Problem: Respiratory: Goal: Respiratory status will improve 01/06/2023 1057 by Garth Bigness, RN Outcome: Completed/Met 01/06/2023 1057 by Garth Bigness, RN Outcome: Adequate for Discharge 01/06/2023 0753 by Garth Bigness, RN Outcome: Progressing   Problem: Pain Management: Goal: Pain level will decrease 01/06/2023 1057 by Garth Bigness, RN Outcome: Completed/Met 01/06/2023 1057 by Garth Bigness, RN Outcome: Adequate for Discharge 01/06/2023 0753 by Garth Bigness, RN Outcome: Progressing   Problem: Skin Integrity: Goal: Wound healing without signs and symptoms infection will improve 01/06/2023 1057 by Garth Bigness, RN Outcome: Completed/Met 01/06/2023 1057 by Garth Bigness, RN Outcome: Adequate for Discharge   Problem: Education: Goal: Knowledge of General Education information will improve Description: Including pain rating scale, medication(s)/side effects and non-pharmacologic comfort measures 01/06/2023 1057 by Garth Bigness, RN Outcome: Completed/Met 01/06/2023 1057 by Garth Bigness, RN Outcome: Adequate for Discharge   Problem: Health Behavior/Discharge Planning: Goal: Ability to manage health-related needs will improve 01/06/2023 1057 by Garth Bigness, RN Outcome: Completed/Met 01/06/2023 1057 by Garth Bigness, RN Outcome: Adequate for Discharge   Problem: Clinical Measurements: Goal: Ability to maintain clinical measurements within normal limits will improve 01/06/2023 1057 by Garth Bigness, RN Outcome: Completed/Met 01/06/2023 1057 by Garth Bigness, RN Outcome: Adequate for Discharge Goal: Will remain free from infection 01/06/2023 1057 by Garth Bigness, RN Outcome: Completed/Met 01/06/2023 1057 by Garth Bigness, RN Outcome: Adequate for Discharge Goal: Diagnostic test results will improve 01/06/2023 1057 by Garth Bigness, RN Outcome: Completed/Met 01/06/2023 1057 by Garth Bigness, RN Outcome: Adequate for Discharge Goal: Respiratory complications will improve 01/06/2023 1057 by Garth Bigness, RN Outcome: Completed/Met 01/06/2023 1057 by Garth Bigness, RN Outcome: Adequate for Discharge Goal: Cardiovascular complication will be avoided 01/06/2023 1057 by Garth Bigness, RN Outcome: Completed/Met 01/06/2023 1057 by Garth Bigness, RN Outcome: Adequate for Discharge   Problem: Activity: Goal: Risk for activity intolerance will decrease 01/06/2023 1057 by Garth Bigness, RN Outcome: Completed/Met 01/06/2023 1057 by Garth Bigness, RN Outcome: Adequate for Discharge   Problem: Nutrition: Goal: Adequate nutrition will be maintained 01/06/2023 1057 by Garth Bigness, RN Outcome:  Completed/Met 01/06/2023 1057 by Garth Bigness, RN Outcome: Adequate for Discharge   Problem: Coping: Goal: Level of anxiety will decrease 01/06/2023 1057 by Garth Bigness, RN Outcome: Completed/Met 01/06/2023 1057 by Garth Bigness, RN Outcome: Adequate for Discharge   Problem: Elimination: Goal: Will not  experience complications related to bowel motility 01/06/2023 1057 by Garth Bigness, RN Outcome: Completed/Met 01/06/2023 1057 by Garth Bigness, RN Outcome: Adequate for Discharge Goal: Will not experience complications related to urinary retention 01/06/2023 1057 by Garth Bigness, RN Outcome: Completed/Met 01/06/2023 1057 by Garth Bigness, RN Outcome: Adequate for Discharge   Problem: Pain Managment: Goal: General experience of comfort will improve 01/06/2023 1057 by Garth Bigness, RN Outcome: Completed/Met 01/06/2023 1057 by Garth Bigness, RN Outcome: Adequate for Discharge   Problem: Safety: Goal: Ability to remain free from injury will improve 01/06/2023 1057 by Garth Bigness, RN Outcome: Completed/Met 01/06/2023 1057 by Garth Bigness, RN Outcome: Adequate for Discharge   Problem: Skin Integrity: Goal: Risk for impaired skin integrity will decrease 01/06/2023 1057 by Garth Bigness, RN Outcome: Completed/Met 01/06/2023 1057 by Garth Bigness, RN Outcome: Adequate for Discharge

## 2023-01-06 NOTE — Plan of Care (Signed)
  Problem: Education: Goal: Knowledge of disease or condition will improve Outcome: Adequate for Discharge Goal: Knowledge of the prescribed therapeutic regimen will improve Outcome: Adequate for Discharge   Problem: Activity: Goal: Risk for activity intolerance will decrease 01/06/2023 1057 by Garth Bigness, RN Outcome: Adequate for Discharge 01/06/2023 0753 by Garth Bigness, RN Outcome: Progressing   Problem: Cardiac: Goal: Will achieve and/or maintain hemodynamic stability 01/06/2023 1057 by Garth Bigness, RN Outcome: Adequate for Discharge 01/06/2023 0753 by Garth Bigness, RN Outcome: Progressing   Problem: Clinical Measurements: Goal: Postoperative complications will be avoided or minimized Outcome: Adequate for Discharge   Problem: Respiratory: Goal: Respiratory status will improve 01/06/2023 1057 by Garth Bigness, RN Outcome: Adequate for Discharge 01/06/2023 0753 by Garth Bigness, RN Outcome: Progressing   Problem: Pain Management: Goal: Pain level will decrease 01/06/2023 1057 by Garth Bigness, RN Outcome: Adequate for Discharge 01/06/2023 0753 by Garth Bigness, RN Outcome: Progressing   Problem: Skin Integrity: Goal: Wound healing without signs and symptoms infection will improve Outcome: Adequate for Discharge   Problem: Education: Goal: Knowledge of General Education information will improve Description: Including pain rating scale, medication(s)/side effects and non-pharmacologic comfort measures Outcome: Adequate for Discharge   Problem: Health Behavior/Discharge Planning: Goal: Ability to manage health-related needs will improve Outcome: Adequate for Discharge   Problem: Clinical Measurements: Goal: Ability to maintain clinical measurements within normal limits will improve Outcome: Adequate for Discharge Goal: Will remain free from infection Outcome: Adequate for Discharge Goal: Diagnostic test results will  improve Outcome: Adequate for Discharge Goal: Respiratory complications will improve Outcome: Adequate for Discharge Goal: Cardiovascular complication will be avoided Outcome: Adequate for Discharge   Problem: Activity: Goal: Risk for activity intolerance will decrease Outcome: Adequate for Discharge   Problem: Nutrition: Goal: Adequate nutrition will be maintained Outcome: Adequate for Discharge   Problem: Coping: Goal: Level of anxiety will decrease Outcome: Adequate for Discharge   Problem: Elimination: Goal: Will not experience complications related to bowel motility Outcome: Adequate for Discharge Goal: Will not experience complications related to urinary retention Outcome: Adequate for Discharge   Problem: Pain Managment: Goal: General experience of comfort will improve Outcome: Adequate for Discharge   Problem: Safety: Goal: Ability to remain free from injury will improve Outcome: Adequate for Discharge   Problem: Skin Integrity: Goal: Risk for impaired skin integrity will decrease Outcome: Adequate for Discharge

## 2023-01-08 ENCOUNTER — Encounter: Payer: Self-pay | Admitting: Oncology

## 2023-01-08 ENCOUNTER — Encounter: Payer: Self-pay | Admitting: *Deleted

## 2023-01-08 ENCOUNTER — Inpatient Hospital Stay: Payer: PPO | Attending: Oncology | Admitting: Oncology

## 2023-01-08 ENCOUNTER — Other Ambulatory Visit: Payer: Self-pay

## 2023-01-08 VITALS — BP 96/61 | HR 60 | Temp 97.6°F | Wt 138.0 lb

## 2023-01-08 DIAGNOSIS — Z87891 Personal history of nicotine dependence: Secondary | ICD-10-CM | POA: Insufficient documentation

## 2023-01-08 DIAGNOSIS — C3411 Malignant neoplasm of upper lobe, right bronchus or lung: Secondary | ICD-10-CM

## 2023-01-08 DIAGNOSIS — Z85118 Personal history of other malignant neoplasm of bronchus and lung: Secondary | ICD-10-CM | POA: Diagnosis not present

## 2023-01-08 DIAGNOSIS — Z902 Acquired absence of lung [part of]: Secondary | ICD-10-CM | POA: Diagnosis not present

## 2023-01-08 DIAGNOSIS — Z79899 Other long term (current) drug therapy: Secondary | ICD-10-CM | POA: Insufficient documentation

## 2023-01-08 DIAGNOSIS — Z7189 Other specified counseling: Secondary | ICD-10-CM | POA: Diagnosis not present

## 2023-01-08 NOTE — Progress Notes (Signed)
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  He had a CT scan in June 2024 which showed Irregular subsolid 2.4 x 1.7 cm anterior right upper lobe lung nodule with a 0.5 cm solid component not significantly changed since March 2024 but has increased since 2018. There were also 1.7 cm left upper lobe groundglass opacities and another linear solid 0.6 cm left upper lobe lung nodule. These nodules were deemed to be okay with monitoring every 6 to 12 months but biopsy was recommended for the right upper lobe lung nodule. Patient had a bronchoscopy guided biopsy which showed adenocarcinoma with lepidic growth. Biopsy shows very focal adenocarcinoma with lepidic growth and invasive component could not be excluded in this sample.   Patient underwent right upper lobectomy in September 2024 which showed adenocarcinoma 1.1 cm pT1b with negative margins and negative pleural involvement.  1 sampled lymph node negative for carcinoma pT1b pN0  Interval history-patient reports some postoperative chest wall pain and fatigue but denies any other complaints at this time  ECOG PS- 1 Pain scale- 3 Opioid associated constipation- no  Review of systems- Review of Systems  Constitutional:  Positive for malaise/fatigue.      No Known Allergies   Past  Medical History:  Diagnosis Date   Arthritis pain    COPD (chronic obstructive pulmonary disease) (HCC)    Depression    GERD (gastroesophageal reflux disease)    Hemiparesis (HCC)    Resolved as of 12/19/22   Hyperlipidemia    Lung cancer (HCC) 2024   Right Lobe   Pneumonia    Polio    age 45   PVD (peripheral vascular disease) (HCC)    Skin cancer    Head   Status post placement of implantable loop recorder    Stroke (HCC) 2018   speech difficulty deficits. mild right sided weakness     Past Surgical History:  Procedure Laterality Date   BRAIN TUMOR EXCISION     pt denies/states had a skin cancer removed from scalp   COLONOSCOPY  04/09/2011   Dr Mechele Collin   COLONOSCOPY WITH PROPOFOL N/A 05/12/2017   Procedure: COLONOSCOPY WITH PROPOFOL;  Surgeon: Scot Jun, MD;  Location: [facility] ENDOSCOPY;  Service: Endoscopy;  Laterality: N/A;   HERNIA REPAIR Right 03/28/2014   inguinal hernia repair   VIDEO BRONCHOSCOPY WITH ENDOBRONCHIAL ULTRASOUND N/A 10/07/2022   Procedure: VIDEO BRONCHOSCOPY WITH ENDOBRONCHIAL ULTRASOUND;  Surgeon: Vida Rigger, MD;  Location: [facility] ORS;  Service: Thoracic;  Laterality: N/A;    Social History   Socioeconomic History   Marital status: Married    Spouse name: Hilda Lias   Number of children: Not on file   Years of education: Not on file   Highest education level: Not on file  Occupational History   Not on file  Tobacco Use   Smoking status: Former    Current packs/day: 0.00    Average  packs/day: 2.0 packs/day for 50.0 years (100.0 ttl pk-yrs)    Types: Cigarettes    Start date: 06/09/1965    Quit date: 06/10/2015    Years since quitting: 7.5   Smokeless tobacco: Never   Tobacco comments:    Quit October 09, 2022  Vaping Use   Vaping status: Never Used  Substance and Sexual Activity   Alcohol use: Not Currently   Drug use: No   Sexual activity: Yes  Other Topics Concern   Not on file  Social History Narrative   Not on file   Social  Determinants of Health   Financial Resource Strain: Low Risk  (06/10/2022)   Received from [facility], [facility]   Overall Financial Resource Strain (CARDIA)    Difficulty of Paying Living Expenses: Not hard at all  Food Insecurity: No Food Insecurity (12/30/2022)   Hunger Vital Sign    Worried About Running Out of Food in the Last Year: Never true    Ran Out of Food in the Last Year: Never true  Transportation Needs: No Transportation Needs (12/23/2022)   PRAPARE - Administrator, Civil Service (Medical): No    Lack of Transportation (Non-Medical): No  Physical Activity: Not on file  Stress: Not on file  Social Connections: Not on file  Intimate Partner Violence: Not At Risk (12/23/2022)   Humiliation, Afraid, Rape, and Kick questionnaire    Fear of Current or Ex-Partner: No    Emotionally Abused: No    Physically Abused: No    Sexually Abused: No    Family History  Problem Relation Age of Onset   Cancer Father        colon     Current Outpatient Medications:    aspirin EC 81 MG tablet, Take 81 mg by mouth daily., Disp: , Rfl:    atorvastatin (LIPITOR) 80 MG tablet, Take 80 mg by mouth daily., Disp: , Rfl:    bisoprolol (ZEBETA) 10 MG tablet, Take 1 tablet (10 mg total) by mouth daily., Disp: 30 tablet, Rfl: 1   FLUoxetine (PROZAC) 40 MG capsule, Take 40 mg by mouth daily., Disp: , Rfl:    Multiple Vitamin (MULTIVITAMIN) tablet, Take 1 tablet by mouth daily., Disp: , Rfl:    omeprazole (PRILOSEC) 20 MG capsule, Take 20 mg by mouth daily., Disp: , Rfl:    oxyCODONE (OXY IR/ROXICODONE) 5 MG immediate release tablet, Take 1 tablet (5 mg total) by mouth every 4 (four) hours as needed for moderate pain., Disp: 28 tablet, Rfl: 0  Physical exam:  Vitals:   01/08/23 1014  BP: 96/61  Pulse: 60  Temp: 97.6 F (36.4 C)  TempSrc: Tympanic  SpO2: 99%  Weight: 138 lb (62.6 kg)   Physical Exam Cardiovascular:     Rate and Rhythm:  Normal rate and regular rhythm.     Heart sounds: Normal heart sounds.  Pulmonary:     Effort: Pulmonary effort is normal.     Breath sounds: Normal breath sounds.     Comments: Surgical scar from recent upper lobectomy is healing well.  Surgical sutures in place Abdominal:     General: Bowel sounds are normal.     Palpations: Abdomen is soft.  Skin:    General: Skin is warm and dry.  Neurological:     Mental Status: He is alert and oriented to person, place, and time.         Latest Ref Rng & Units 01/01/2023  2:18 AM  CMP  Glucose 70 - 99 mg/dL 161   BUN 8 - 23 mg/dL 10   Creatinine 0.96 - 1.24 mg/dL 0.45   Sodium 409 - 811 mmol/L 135   Potassium 3.5 - 5.1 mmol/L 3.5   Chloride 98 - 111 mmol/L 103   CO2 22 - 32 mmol/L 25   Calcium 8.9 - 10.3 mg/dL 8.5       Latest Ref Rng & Units 01/01/2023    2:18 AM  CBC  WBC 4.0 - 10.5 K/uL 14.2   Hemoglobin 13.0 - 17.0 g/dL 91.4   Hematocrit 78.2 - 52.0 % 33.0   Platelets 150 - 400 K/uL 420     No images are attached to the encounter.  DG Chest 2 View  Result Date: 01/06/2023 CLINICAL DATA:  Shortness of breath.  Right pneumothorax. EXAM: CHEST - 2 VIEW COMPARISON:  January 05, 2023. FINDINGS: Stable cardiomediastinal silhouette. Grossly stable moderate to large right apical pneumothorax. Stable subcutaneous emphysema seen over right lateral chest wall. Left lung is clear. Small right pleural effusion is noted. The visualized skeletal structures are unremarkable. IMPRESSION: Grossly stable moderate to large right apical pneumothorax. Stable subcutaneous emphysema seen over right lateral chest wall. Electronically Signed   By: Lupita Raider M.D.   On: 01/06/2023 09:30   DG Chest 2 View  Result Date: 01/05/2023 CLINICAL DATA:  Follow-up right pneumothorax. EXAM: CHEST - 2 VIEW COMPARISON:  01/04/2023. FINDINGS: Similar appearance of moderate to large right pneumothorax which overlies the apical portion of the right hemithorax.  This appears unchanged in volume when compared with the previous exam, approximately 40-50% as reported previously. Right pleural effusion is unchanged. Left lung is clear. Similar appearance of subcutaneous soft tissue gas overlying the right chest wall. IMPRESSION: 1. Similar appearance of moderate to large right pneumothorax. 2. Unchanged right pleural effusion. Electronically Signed   By: Signa Kell M.D.   On: 01/05/2023 08:54   DG Chest 1V REPEAT Same Day  Result Date: 01/04/2023 CLINICAL DATA:  Follow-up right pneumothorax EXAM: CHEST - 1 VIEW SAME DAY COMPARISON:  Earlier same day FINDINGS: Similar appearance to the immediate prior exam with large right pneumothorax but no sign of tension. Volume is 40-50% by my estimation. Air in the soft tissues of the right chest appears similar. Previously seen chest tube is no longer visible. Left chest remains clear. IMPRESSION: Similar appearance to the immediate prior exam with large right pneumothorax but no sign of tension. Volume 40-50% by my estimation. Electronically Signed   By: Paulina Fusi M.D.   On: 01/04/2023 17:01   DG CHEST PORT 1 VIEW  Result Date: 01/04/2023 CLINICAL DATA:  Follow up pneumothorax. EXAM: PORTABLE CHEST 1 VIEW COMPARISON:  Radiographs 01/03/2023 and 01/02/2023.  CT 10/07/2022. FINDINGS: 0647 hours. Two views are submitted. Previously demonstrated pigtail pleural catheter inferiorly in the right pleural space has pulled back into the right lateral chest wall. The right-sided pneumothorax has significantly increased in volume, estimated at 30-40%. There is no mediastinal shift. There is partial collapse of the right lung. Right chest wall soft tissue emphysema has mildly increased. The left lung is clear. The heart size and mediastinal contours are stable. IMPRESSION: Significant interval increase in size of the right-sided pneumothorax, estimated at 30-40%, without tension component. The pigtail pleural catheter has pulled back  into the right lateral chest wall. Consider chest 2 replacement. Critical Value/emergent results were called by telephone at the time of interpretation on 01/04/2023 at  10:55 am to the patient's nurse on 2600, who verbally acknowledged these results. Dr. Dorris Fetch is aware of the findings. Electronically Signed   By: Carey Bullocks M.D.   On: 01/04/2023 11:00   DG CHEST PORT 1 VIEW  Result Date: 01/03/2023 CLINICAL DATA:  Right pneumothorax. EXAM: PORTABLE CHEST 1 VIEW COMPARISON:  January 02, 2023. FINDINGS: Stable position of right basilar chest tube. Stable small right apical pneumothorax. Stable subcutaneous emphysema seen over right lateral chest wall. Left lung is clear. IMPRESSION: Stable small right apical pneumothorax. Stable right chest tube. Stable subcutaneous emphysema over right lateral chest wall. Electronically Signed   By: Lupita Raider M.D.   On: 01/03/2023 10:50   DG CHEST PORT 1 VIEW  Result Date: 01/02/2023 CLINICAL DATA:  Status post lobectomy of lung. EXAM: PORTABLE CHEST 1 VIEW COMPARISON:  Yesterday's exam. FINDINGS: Right pigtail catheter coiled in the inferior medial right hemithorax. Small to moderate pneumothorax persists, visualized between the posterolateral fourth and fifth ribs. This has minimally increased from yesterday's exam. Subcutaneous emphysema in the right chest wall, may have improved. Stable heart size and mediastinal contours. The left lung is clear. IMPRESSION: Small to moderate right pneumothorax, minimally increased from yesterday's exam. Right pigtail catheter in place. Electronically Signed   By: Narda Rutherford M.D.   On: 01/02/2023 10:10   DG CHEST PORT 1 VIEW  Result Date: 01/01/2023 CLINICAL DATA:  [id number] Encounter for chest tube placement [id number]. EXAM: PORTABLE CHEST 1 VIEW COMPARISON:  Chest radiograph 01/01/2023. FINDINGS: Interval placement of a right-sided pleural drainage catheter with decreased right pneumothorax. Unchanged subcutaneous  emphysema along the right chest wall. Stable cardiac and mediastinal contours. Left lung is clear. IMPRESSION: Interval placement of a right-sided pleural drainage catheter with decreased right pneumothorax. Electronically Signed   By: Orvan Falconer M.D.   On: 01/01/2023 11:20   DG CHEST PORT 1 VIEW  Result Date: 01/01/2023 CLINICAL DATA:  [id number] S/P lobectomy of lung [id number]. EXAM: PORTABLE CHEST 1 VIEW COMPARISON:  Chest radiograph 12/31/2022. FINDINGS: Unchanged right-sided chest tube with decreased right apical pneumothorax. Stable postoperative changes from right-sided chest surgery. Stable cardiac and mediastinal contours. IMPRESSION: Unchanged right-sided chest tube with decreased right apical pneumothorax. Electronically Signed   By: Orvan Falconer M.D.   On: 01/01/2023 10:39   DG CHEST PORT 1 VIEW  Result Date: 01/01/2023 CLINICAL DATA:  Right-sided chest tube fell out this morning EXAM: PORTABLE CHEST 1 VIEW COMPARISON:  Earlier today FINDINGS: Right chest tube removal with large and increased right pneumothorax. Postoperative right lung with mild atelectasis. Clear left lung and normal heart size. No mediastinal shift or diaphragm depression. Extensive chest wall emphysema on the right. Case discussed with RN Wilson Singer who will relay the message to CTS provider. IMPRESSION: Large and increased right pneumothorax after chest tube removal. Electronically Signed   By: Tiburcio Pea M.D.   On: 01/01/2023 07:47   DG CHEST PORT 1 VIEW  Result Date: 12/31/2022 CLINICAL DATA:  Status post lobectomy of lung. EXAM: PORTABLE CHEST 1 VIEW COMPARISON:  Radiograph yesterday. FINDINGS: Postsurgical volume loss in the right hemithorax with chain sutures at the right hilum. Right chest tube remains in place. Small right apical pneumothorax is unchanged. Subcutaneous emphysema in the right chest wall, slightly improved. Lower lung volumes from prior. The exam is otherwise unchanged. IMPRESSION: 1. Unchanged  small right apical pneumothorax with right chest tube in place. 2. Right chest wall subcutaneous emphysema, slightly improved. 3. Postsurgical volume loss in the  right hemithorax with chain sutures at the right hilum. Electronically Signed   By: Narda Rutherford M.D.   On: 12/31/2022 10:54   DG CHEST PORT 1 VIEW  Result Date: 12/30/2022 CLINICAL DATA:  Pneumothorax. EXAM: PORTABLE CHEST 1 VIEW COMPARISON:  One-view chest x-ray 12/29/2022 FINDINGS: The heart size is normal. A right sided chest tube is in place. Moderate pneumothorax of 20% is stable. Extensive subcutaneous emphysema is present over the right chest wall and neck. The left lung is clear. IMPRESSION: 1. Stable moderate right pneumothorax with chest tube in place. 2. Extensive subcutaneous emphysema over the right chest wall and neck. Electronically Signed   By: Marin Roberts M.D.   On: 12/30/2022 07:49   DG CHEST PORT 1 VIEW  Result Date: 12/29/2022 CLINICAL DATA:  [id number] Pneumothorax [id number] EXAM: PORTABLE CHEST 1 VIEW COMPARISON:  December 28, 2022 FINDINGS: The cardiomediastinal silhouette is unchanged in contour.RIGHT-sided chest tube. No pleural effusion. Similar appearance of a small RIGHT pneumothorax. RIGHT-sided subcutaneous air. No acute pleuroparenchymal abnormality. IMPRESSION: Similar appearance of a small RIGHT pneumothorax with RIGHT chest tube in place. Electronically Signed   By: Meda Klinefelter M.D.   On: 12/29/2022 08:49   DG CHEST PORT 1 VIEW  Result Date: 12/28/2022 CLINICAL DATA:  Evaluate pneumothorax. EXAM: PORTABLE CHEST 1 VIEW COMPARISON:  12/27/2022 FINDINGS: Right-sided chest tube remains in place with no change in patient's known small right sided pneumothorax per remainder of the lungs are clear. Cardiomediastinal silhouette is within normal. Stable subcutaneous emphysema over the right thorax and neck base. Remainder of the exam is unchanged. IMPRESSION: No change in patient's known small right-sided  pneumothorax with right-sided chest tube in place. Electronically Signed   By: Elberta Fortis M.D.   On: 12/28/2022 08:38   DG CHEST PORT 1 VIEW  Result Date: 12/27/2022 CLINICAL DATA:  Right pneumothorax. EXAM: PORTABLE CHEST 1 VIEW COMPARISON:  Radiograph yesterday FINDINGS: Stable size of right apical pneumothorax, small to moderate. Right chest tube remains in place with tip directed towards the apex. Postsurgical change in the right hemithorax with volume loss and chain sutures. Subcutaneous emphysema is similar to yesterday. No focal left lung opacity. Stable heart size and mediastinal contours IMPRESSION: Postsurgical change in the right hemithorax. Stable size of right apical pneumothorax, small to moderate. Right chest tube remains in place. Electronically Signed   By: Narda Rutherford M.D.   On: 12/27/2022 09:01   DG CHEST PORT 1 VIEW  Result Date: 12/26/2022 CLINICAL DATA:  Pneumothorax. EXAM: PORTABLE CHEST 1 VIEW COMPARISON:  December 25, 2022. FINDINGS: The heart size and mediastinal contours are within normal limits. Right-sided chest tube is unchanged. Right apical pneumothorax noted on prior exam is slightly decreased in size. Stable subcutaneous emphysema is seen over right lateral chest wall. Left lung is unremarkable. IMPRESSION: Right-sided chest tube is unchanged in position. Right apical pneumothorax noted on prior exam is slightly decreased in size. Continued subcutaneous emphysema. Electronically Signed   By: Lupita Raider M.D.   On: 12/26/2022 10:48   DG CHEST PORT 1 VIEW  Result Date: 12/25/2022 CLINICAL DATA:  Follow-up right pneumothorax. EXAM: PORTABLE CHEST 1 VIEW COMPARISON:  12/24/2022 FINDINGS: A right chest tube remains in place with its tip at the right lung apex. No significant change in an approximately 30% right apical and basilar pneumothorax. A small amount of mediastinal air is unchanged. Increased subcutaneous emphysema on the right. The patient is rotated to  the right with no mediastinal shift to  the left. Stable mild changes of COPD. Tortuous aorta. Mild-to-moderate bilateral AC joint degenerative changes. Thoracic spine degenerative changes. IMPRESSION: 1. No significant change in an approximately 30% right apical and basilar pneumothorax with a right chest tube in place. 2. Increased subcutaneous emphysema on the right. 3. Stable small amount of mediastinal air. 4. Stable changes of COPD. Electronically Signed   By: Beckie Salts M.D.   On: 12/25/2022 11:07   DG Chest Port 1 View  Result Date: 12/24/2022 CLINICAL DATA:  Status post right lobectomy EXAM: PORTABLE CHEST 1 VIEW COMPARISON:  Radiograph yesterday. FINDINGS: Right-sided chest tube in place, directed towards the apex. Moderate right pneumothorax persists, slightly decreased from yesterday's exam. Increasing subcutaneous emphysema in the right chest wall. No focal left lung opacity. Normal heart size with stable mediastinal contours. IMPRESSION: 1. Slightly diminishing right-sided pneumothorax with right chest tube in place. 2. Slight increase in right chest wall subcutaneous emphysema. Electronically Signed   By: Narda Rutherford M.D.   On: 12/24/2022 11:04   DG Chest 2 View  Result Date: 12/23/2022 CLINICAL DATA:  Preop for right upper lobectomy. EXAM: CHEST - 2 VIEW COMPARISON:  Radiograph and CT 10/07/2022, PET CT 10/31/2022 FINDINGS: Nodular opacity in the right upper lobe. The heart is normal in size. Mild biapical pleuroparenchymal scarring. No pulmonary edema, pleural effusion, or pneumothorax. Thoracic spondylosis. IMPRESSION: Known right upper lobe nodule.  No acute findings. Electronically Signed   By: Narda Rutherford M.D.   On: 12/23/2022 21:41   DG Chest Port 1 View  Result Date: 12/23/2022 CLINICAL DATA:  Status post lobectomy of lung. Right upper lobectomy. EXAM: PORTABLE CHEST 1 VIEW COMPARISON:  Preoperative radiograph 12/19/2022 FINDINGS: Postsurgical change in the right  hemithorax with volume loss and sutures at the hilum. Right pneumothorax appears to be moderate in size with a chest tube directed towards the apex. Pleural line is demonstrated between the posterior fifth and sixth ribs. Right chest wall subcutaneous emphysema. No focal left lung opacity. The heart is normal in size for technique. IMPRESSION: Postsurgical change in the right hemithorax with volume loss and sutures at the hilum. Right pneumothorax appears to be moderate in size with a chest tube directed towards the apex. Electronically Signed   By: Narda Rutherford M.D.   On: 12/23/2022 21:39   Myocardial Perfusion Imaging  Result Date: 12/18/2022   Findings are consistent with no ischemia and no infarction. The study is low risk.   No ST deviation was noted.   LV perfusion is abnormal. There is no evidence of ischemia. There is no evidence of infarction. Defect 1: There is a small defect with moderate reduction in uptake present in the apical inferior location(s) that is fixed. There is normal wall motion in the defect area. Consistent with artifact caused by subdiaphragmatic activity.   Left ventricular function is normal. Nuclear stress EF: 61%. The left ventricular ejection fraction is normal (55-65%). End diastolic cavity size is normal. End systolic cavity size is normal.     Assessment and plan- Patient is a 72 y.o. male with history of stage Ia adenocarcinoma of the right upper lobe of the lung pT1b N0 M0 s/p right upper lobectomy here to discuss further management  I have discussed final pathology results with the patient which showed a 1.1 cm lipidic type adenocarcinoma with negative margins.  1 sample lymph node negative for malignancy.  This constitutes 182 disease and patient does not require any adjuvant radiation or chemotherapy at this time.  He will need surveillance scans every 6 months for the first 3 years followed by yearly imaging.  I will see him back sometime in March 2025 with  scans prior   Cancer Staging  Malignant neoplasm of right upper lobe of lung [facility]) Staging form: Lung, AJCC 8th Edition - Clinical stage from 10/28/2022: Stage IA3 (cT1c, cN0, cM0) - Signed by Creig Hines, MD on 10/28/2022 Histopathologic type: Adenocarcinoma, NOS Stage prefix: Initial diagnosis - Pathologic stage from 01/08/2023: Stage IA2 (pT1b, pN0, cM0) - Signed by Creig Hines, MD on 01/08/2023 Histopathologic type: Adenocarcinoma, NOS     Visit Diagnosis 1. Malignant neoplasm of right upper lobe of lung (HCC)   2. Goals of care, counseling/discussion      Dr. Owens Shark, MD, MPH [facility] [phone] 01/08/2023 2:45 PM

## 2023-01-10 ENCOUNTER — Telehealth: Payer: PPO | Admitting: Thoracic Surgery (Cardiothoracic Vascular Surgery)

## 2023-01-10 ENCOUNTER — Ambulatory Visit: Payer: PPO | Admitting: Thoracic Surgery (Cardiothoracic Vascular Surgery)

## 2023-01-17 ENCOUNTER — Encounter: Payer: Self-pay | Admitting: Thoracic Surgery (Cardiothoracic Vascular Surgery)

## 2023-01-17 ENCOUNTER — Ambulatory Visit (INDEPENDENT_AMBULATORY_CARE_PROVIDER_SITE_OTHER): Payer: Self-pay | Admitting: Thoracic Surgery (Cardiothoracic Vascular Surgery)

## 2023-01-17 VITALS — BP 102/66 | HR 57 | Resp 20 | Wt 141.2 lb

## 2023-01-17 DIAGNOSIS — C3411 Malignant neoplasm of upper lobe, right bronchus or lung: Secondary | ICD-10-CM

## 2023-01-17 MED ORDER — TRAMADOL HCL 50 MG PO TABS: 50.0000 mg | ORAL_TABLET | Freq: Four times a day (QID) | ORAL | 0 refills | Status: DC | PRN

## 2023-01-17 NOTE — Progress Notes (Signed)
[address]       [place] [address]             [phone]        [name] Medical Record #[id number] Date of Birth: 09/04/1950  Referring: Creig Hines, MD Primary Care: Lynnea Ferrier, MD Primary Cardiologist:None  Reason for visit:   follow-up  History of Present Illness:     72yo male presents for his 1st follow-up appointment.  Overall, he is doing well.    Physical Exam: There were no vitals taken for this visit.  Alert NAD Incision clean.   Abdomen, ND no peripheral edema   Diagnostic Studies & Laboratory data:  Path: FINAL MICROSCOPIC DIAGNOSIS:  A. LYMPH NODE, HILAR, EXCISION: One lymph node negative for metastatic carcinoma (0/1)  B. LUNG, RIGHT UPPER LOBE, LOBECTOMY: Adenocarcinoma, lipidic predominant, 1.1 cm (pT1b) All surgical margins negative for carcinoma Negative pleural involvement 1 lymph node negative for metastatic carcinoma (0/1) See oncology table  ONCOLOGY TABLE: LUNG: Resection Synchronous Tumors: Not applicable Total Number of Primary Tumors: 1 Procedure: Right upper lobectomy and hilar lymph node excision Specimen Laterality: Right upper lung lobe Tumor Focality: Unifocal Tumor Site: Right upper lung lobe Tumor Size: 1.1 x 1.0 x 0.8 cm      Total Tumor Size: 1.1 x 1.0 x 0.8 cm      Invasive Tumor Size (applies only to invasive nonmucinous adenocarcinoma with a lepidic           component): 1.1 x 1.0 x 0.8 cm Histologic Type: Adenocarcinoma, lipidic predominant Visceral Pleura Invasion: Not identified Direct Invasion of Adjacent Structures: Not applicable Lymphovascular Invasion: Not identified Margins: All surgical margins negative for carcinoma      Closest Margin(s) to Invasive Carcinoma: 7 cm from bronchial margin Treatment Effect: No known presurgical therapy Regional Lymph Nodes:      Number of Lymph Nodes Involved: 0                           Nodal Sites with  Tumor: 0      Number of Lymph Nodes Examined: 2                      Nodal Sites Examined: Hilar and peribronchial Distant Metastasis:      Distant Site(s) Involved: Not applicable Pathologic Stage Classification (pTNM, AJCC 8th Edition): pT1b, pN0     Assessment / Plan:   72yo male s/p right upper lobectomy for a T1bN0M0 adencarcinoma.  I have referred him to medical oncology.  He will follow-up in 1 month with a CXR.  Rx for tramadol given.  He will follow-up for his surveillance in [place].  He will need close attention to the left upper lobe GGO.   Corliss Skains 01/17/2023 10:06 AM

## 2023-01-27 DIAGNOSIS — J449 Chronic obstructive pulmonary disease, unspecified: Secondary | ICD-10-CM | POA: Diagnosis not present

## 2023-01-27 DIAGNOSIS — F325 Major depressive disorder, single episode, in full remission: Secondary | ICD-10-CM | POA: Diagnosis not present

## 2023-01-27 DIAGNOSIS — R7303 Prediabetes: Secondary | ICD-10-CM | POA: Diagnosis not present

## 2023-01-27 DIAGNOSIS — E278 Other specified disorders of adrenal gland: Secondary | ICD-10-CM | POA: Diagnosis not present

## 2023-01-27 DIAGNOSIS — I700 Atherosclerosis of aorta: Secondary | ICD-10-CM | POA: Diagnosis not present

## 2023-01-27 DIAGNOSIS — I739 Peripheral vascular disease, unspecified: Secondary | ICD-10-CM | POA: Diagnosis not present

## 2023-01-27 DIAGNOSIS — E785 Hyperlipidemia, unspecified: Secondary | ICD-10-CM | POA: Diagnosis not present

## 2023-01-27 DIAGNOSIS — K860 Alcohol-induced chronic pancreatitis: Secondary | ICD-10-CM | POA: Diagnosis not present

## 2023-01-27 DIAGNOSIS — I69359 Hemiplegia and hemiparesis following cerebral infarction affecting unspecified side: Secondary | ICD-10-CM | POA: Diagnosis not present

## 2023-01-27 DIAGNOSIS — C3491 Malignant neoplasm of unspecified part of right bronchus or lung: Secondary | ICD-10-CM | POA: Diagnosis not present

## 2023-01-27 DIAGNOSIS — Z23 Encounter for immunization: Secondary | ICD-10-CM | POA: Diagnosis not present

## 2023-02-14 NOTE — Progress Notes (Unsigned)
     [address]       [place] [address]             [phone]   HPI: This is a 72 year old male who is s/p  robotic assisted right video thoracoscopy, right upper lobectomy, mediastinal lymph node sampling, and intercostal nerve block by Dr. Cliffton Asters on 12/23/2022. Pathology showed adenocarcinoma of  the right upper lobe and one lymph node negative for metastatic carcinoma (TNM Code: T1bN0M0). He was discharged on 01/06/2023. Dr. Cliffton Asters has already discussed his pathology with him and referred him to medical oncology. He presents today for in person post op follow up. Patient states his breathing is "pretty good". He has no specific complaint today. Current Outpatient Medications  Medication Sig Dispense Refill   aspirin EC 81 MG tablet Take 81 mg by mouth daily.     atorvastatin (LIPITOR) 80 MG tablet Take 80 mg by mouth daily.     bisoprolol (ZEBETA) 10 MG tablet Take 1 tablet (10 mg total) by mouth daily. 30 tablet 1   FLUoxetine (PROZAC) 40 MG capsule Take 40 mg by mouth daily.     Multiple Vitamin (MULTIVITAMIN) tablet Take 1 tablet by mouth daily.     omeprazole (PRILOSEC) 20 MG capsule Take 20 mg by mouth daily.     oxyCODONE (OXY IR/ROXICODONE) 5 MG immediate release tablet Take 1 tablet (5 mg total) by mouth every 4 (four) hours as needed for moderate pain. 28 tablet 0   traMADol (ULTRAM) 50 MG tablet Take 1 tablet (50 mg total) by mouth every 6 (six) hours as needed. 40 tablet 0  Vital Signs: Vitals:   02/19/23 1305  BP: 139/85  Pulse: 60  Resp: 20  SpO2: 95%    Physical Exam: CV-RRR Pulmonary-Clear to auscultation bilaterally Extremities-No LE edema Wounds-Clean, dry, well healed  Diagnostic Tests: Narrative & Impression  CLINICAL DATA:  Prior surgery for lung nodule a month ago.   EXAM: CHEST - 2 VIEW   COMPARISON:  X-ray 01/06/2023 and older   FINDINGS: Left lung is clear. No consolidation, pneumothorax or effusion. Normal  cardiopericardial silhouette. No edema. Degenerative changes of the spine.   There is volume loss along the right hemithorax with surgical changes and pleural effusion. No pneumothorax or consolidation.   IMPRESSION: Postsurgical changes along the right hemithorax with volume loss. Pleural effusion.     Electronically Signed   By: Karen Kays M.D.   On: 02/19/2023 14:58     Impression and Plan: We reviewed the results of today's chest x ray. Per Dr. Lucilla Lame note on 01/17/2023, patient will follow-up for his lung surveillance in [place] (Dr. Smith Robert). He will need close attention to the left upper lobe GGO. He will be seen by [facility] PR[facility].     Ardelle Balls, PA-C [facility] [phone]

## 2023-02-18 ENCOUNTER — Other Ambulatory Visit: Payer: Self-pay | Admitting: Thoracic Surgery (Cardiothoracic Vascular Surgery)

## 2023-02-18 DIAGNOSIS — C3411 Malignant neoplasm of upper lobe, right bronchus or lung: Secondary | ICD-10-CM

## 2023-02-19 ENCOUNTER — Ambulatory Visit
Admission: RE | Admit: 2023-02-19 | Discharge: 2023-02-19 | Disposition: A | Discharge status: Home / Self Care | Payer: PPO | Source: Ambulatory Visit | Attending: Thoracic Surgery (Cardiothoracic Vascular Surgery)

## 2023-02-19 ENCOUNTER — Ambulatory Visit (INDEPENDENT_AMBULATORY_CARE_PROVIDER_SITE_OTHER): Payer: Self-pay | Admitting: Physician Assistant

## 2023-02-19 ENCOUNTER — Encounter: Payer: Self-pay | Admitting: Physician Assistant

## 2023-02-19 VITALS — BP 139/85 | HR 60 | Resp 20 | Wt 141.0 lb

## 2023-02-19 DIAGNOSIS — C3411 Malignant neoplasm of upper lobe, right bronchus or lung: Secondary | ICD-10-CM

## 2023-02-19 DIAGNOSIS — J90 Pleural effusion, not elsewhere classified: Secondary | ICD-10-CM | POA: Diagnosis not present

## 2023-02-20 DIAGNOSIS — J449 Chronic obstructive pulmonary disease, unspecified: Secondary | ICD-10-CM | POA: Diagnosis not present

## 2023-03-05 ENCOUNTER — Encounter: Payer: Self-pay | Admitting: *Deleted

## 2023-03-10 ENCOUNTER — Ambulatory Visit: Admission: RE | Admit: 2023-03-10 | Payer: PPO | Source: Ambulatory Visit

## 2023-03-10 ENCOUNTER — Encounter: Admission: RE | Payer: Self-pay | Source: Ambulatory Visit

## 2023-03-10 SURGERY — COLONOSCOPY WITH PROPOFOL
Anesthesia: General

## 2023-05-20 DIAGNOSIS — C3411 Malignant neoplasm of upper lobe, right bronchus or lung: Secondary | ICD-10-CM | POA: Diagnosis not present

## 2023-05-20 DIAGNOSIS — R0609 Other forms of dyspnea: Secondary | ICD-10-CM | POA: Diagnosis not present

## 2023-05-20 DIAGNOSIS — R5381 Other malaise: Secondary | ICD-10-CM | POA: Diagnosis not present

## 2023-05-20 DIAGNOSIS — J449 Chronic obstructive pulmonary disease, unspecified: Secondary | ICD-10-CM | POA: Diagnosis not present

## 2023-06-25 ENCOUNTER — Ambulatory Visit
Admission: RE | Admit: 2023-06-25 | Discharge: 2023-06-25 | Disposition: A | Discharge status: Home / Self Care | Payer: PPO | Source: Ambulatory Visit | Attending: Oncology | Admitting: Oncology

## 2023-06-25 DIAGNOSIS — J90 Pleural effusion, not elsewhere classified: Secondary | ICD-10-CM | POA: Diagnosis not present

## 2023-06-25 DIAGNOSIS — I700 Atherosclerosis of aorta: Secondary | ICD-10-CM | POA: Diagnosis not present

## 2023-06-25 DIAGNOSIS — J439 Emphysema, unspecified: Secondary | ICD-10-CM | POA: Diagnosis not present

## 2023-06-25 DIAGNOSIS — C3411 Malignant neoplasm of upper lobe, right bronchus or lung: Secondary | ICD-10-CM | POA: Diagnosis not present

## 2023-07-09 ENCOUNTER — Encounter: Payer: Self-pay | Admitting: Oncology

## 2023-07-09 ENCOUNTER — Inpatient Hospital Stay: Payer: PPO | Attending: Oncology | Admitting: Oncology

## 2023-07-09 VITALS — BP 143/96 | HR 55 | Temp 97.4°F | Resp 19 | Ht 65.0 in | Wt 142.2 lb

## 2023-07-09 DIAGNOSIS — Z800 Family history of malignant neoplasm of digestive organs: Secondary | ICD-10-CM | POA: Insufficient documentation

## 2023-07-09 DIAGNOSIS — Z902 Acquired absence of lung [part of]: Secondary | ICD-10-CM | POA: Insufficient documentation

## 2023-07-09 DIAGNOSIS — Z79899 Other long term (current) drug therapy: Secondary | ICD-10-CM | POA: Diagnosis not present

## 2023-07-09 DIAGNOSIS — Z85118 Personal history of other malignant neoplasm of bronchus and lung: Secondary | ICD-10-CM | POA: Insufficient documentation

## 2023-07-09 DIAGNOSIS — Z08 Encounter for follow-up examination after completed treatment for malignant neoplasm: Secondary | ICD-10-CM

## 2023-07-09 DIAGNOSIS — Z87891 Personal history of nicotine dependence: Secondary | ICD-10-CM | POA: Diagnosis not present

## 2023-07-09 NOTE — Progress Notes (Signed)
 Hematology/Oncology Consult note [facility]  [phone] [phone]  Patient Care Team: Lynnea Ferrier, MD as PCP - General (Internal Medicine) Hassan Rowan, MD (Inactive) as Consulting Physician (Family Medicine) Kieth Brightly, MD (General Surgery) Glory Buff, RN as Oncology Nurse Navigator   Name of the patient: Jeremy Harrell  [id number]  [birth year]   Date of visit: 07/09/23  Diagnosis- stage IA 2 adenocarcinoma with lepidic pattern of the left upper lobe s/p surgery   Chief complaint/ Reason for visit-routine surveillance visit for lung cancer  Heme/Onc history:  Patient is a 73 year old male with a longstanding smoking history.  He had a CT scan in June 2024 which showed Irregular subsolid 2.4 x 1.7 cm anterior right upper lobe lung nodule with a 0.5 cm solid component not significantly changed since March 2024 but has increased since 2018. There were also 1.7 cm left upper lobe groundglass opacities and another linear solid 0.6 cm left upper lobe lung nodule. These nodules were deemed to be okay with monitoring every 6 to 12 months but biopsy was recommended for the right upper lobe lung nodule. Patient had a bronchoscopy guided biopsy which showed adenocarcinoma with lepidic growth. Biopsy shows very focal adenocarcinoma with lepidic growth and invasive component could not be excluded in this sample.    Patient underwent right upper lobectomy in September 2024 which showed adenocarcinoma 1.1 cm pT1b with negative margins and negative pleural involvement.  1 sampled lymph node negative for carcinoma pT1b pN0.  He did not require any adjuvant chemotherapy and remains in remission  Interval history-he has occasional left-sided chest wall pain which comes and goes but is overall doing well.  Denies any Fatigue or exertional shortness of breath.  ECOG PS- 1 Pain scale- 0   Review of systems- Review of Systems   Constitutional:  Negative for chills, fever, malaise/fatigue and weight loss.  HENT:  Negative for congestion, ear discharge and nosebleeds.   Eyes:  Negative for blurred vision.  Respiratory:  Negative for cough, hemoptysis, sputum production, shortness of breath and wheezing.   Cardiovascular:  Negative for chest pain, palpitations, orthopnea and claudication.  Gastrointestinal:  Negative for abdominal pain, blood in stool, constipation, diarrhea, heartburn, melena, nausea and vomiting.  Genitourinary:  Negative for dysuria, flank pain, frequency, hematuria and urgency.  Musculoskeletal:  Negative for back pain, joint pain and myalgias.  Skin:  Negative for rash.  Neurological:  Negative for dizziness, tingling, focal weakness, seizures, weakness and headaches.  Endo/Heme/Allergies:  Does not bruise/bleed easily.  Psychiatric/Behavioral:  Negative for depression and suicidal ideas. The patient does not have insomnia.       No Known Allergies   Past Medical History:  Diagnosis Date   Arthritis pain    COPD (chronic obstructive pulmonary disease) (HCC)    Depression    GERD (gastroesophageal reflux disease)    Hemiparesis (HCC)    Resolved as of 12/19/22   Hyperlipidemia    Lung cancer (HCC) 2024   Right Lobe   Pneumonia    Polio    age 48   PVD (peripheral vascular disease) (HCC)    Skin cancer    Head   Status post placement of implantable loop recorder    Stroke (HCC) 2018   speech difficulty deficits. mild right sided weakness     Past Surgical History:  Procedure Laterality Date   BRAIN TUMOR EXCISION     pt denies/states had a skin cancer removed from  scalp   COLONOSCOPY  04/09/2011   Dr Mechele Collin   COLONOSCOPY WITH PROPOFOL N/A 05/12/2017   Procedure: COLONOSCOPY WITH PROPOFOL;  Surgeon: Scot Jun, MD;  Location[facility] ENDOSCOPY;  Service: Endoscopy;  Laterality: N/A;   HERNIA REPAIR Right 03/28/2014   inguinal hernia repair   VIDEO BRONCHOSCOPY WITH  ENDOBRONCHIAL ULTRASOUND N/A 10/07/2022   Procedure: VIDEO BRONCHOSCOPY WITH ENDOBRONCHIAL ULTRASOUND;  Surgeon: Vida Rigger, MD;  Location: [facility] ORS;  Service: Thoracic;  Laterality: N/A;    Social History   Socioeconomic History   Marital status: Married    Spouse name: Hilda Lias   Number of children: Not on file   Years of education: Not on file   Highest education level: Not on file  Occupational History   Not on file  Tobacco Use   Smoking status: Former    Current packs/day: 0.00    Average packs/day: 2.0 packs/day for 50.0 years (100.0 ttl pk-yrs)    Types: Cigarettes    Start date: 06/09/1965    Quit date: 06/10/2015    Years since quitting: 8.0   Smokeless tobacco: Never   Tobacco comments:    Quit October 09, 2022  Vaping Use   Vaping status: Never Used  Substance and Sexual Activity   Alcohol use: Not Currently   Drug use: No   Sexual activity: Yes  Other Topics Concern   Not on file  Social History Narrative   Not on file   Social Drivers of Health   Financial Resource Strain: Medium Risk (01/27/2023)   Received from [facility]   Overall Financial Resource Strain (CARDIA)    Difficulty of Paying Living Expenses: Somewhat hard  Food Insecurity: No Food Insecurity (01/27/2023)   Received from [facility]   Hunger Vital Sign    Worried About Running Out of Food in the Last Year: Never true    Ran Out of Food in the Last Year: Never true  Transportation Needs: No Transportation Needs (01/27/2023)   Received from [facility] - Transportation    In the past 12 months, has lack of transportation kept you from medical appointments or from getting medications?: No    Lack of Transportation (Non-Medical): No  Physical Activity: Not on file  Stress: Not on file  Social Connections: Not on file  Intimate Partner Violence: Not At Risk (12/23/2022)   Humiliation, Afraid, Rape, and Kick questionnaire    Fear  of Current or Ex-Partner: No    Emotionally Abused: No    Physically Abused: No    Sexually Abused: No    Family History  Problem Relation Age of Onset   Cancer Father        colon     Current Outpatient Medications:    aspirin EC 81 MG tablet, Take 81 mg by mouth daily., Disp: , Rfl:    atorvastatin (LIPITOR) 80 MG tablet, Take 80 mg by mouth daily., Disp: , Rfl:    bisoprolol (ZEBETA) 10 MG tablet, Take 1 tablet (10 mg total) by mouth daily., Disp: 30 tablet, Rfl: 1   FLUoxetine (PROZAC) 40 MG capsule, Take 40 mg by mouth daily., Disp: , Rfl:    Multiple Vitamin (MULTIVITAMIN) tablet, Take 1 tablet by mouth daily., Disp: , Rfl:    omeprazole (PRILOSEC) 20 MG capsule, Take 20 mg by mouth daily., Disp: , Rfl:    oxyCODONE (OXY IR/ROXICODONE) 5 MG immediate release tablet, Take 1 tablet (5 mg total)  by mouth every 4 (four) hours as needed for moderate pain., Disp: 28 tablet, Rfl: 0   traMADol (ULTRAM) 50 MG tablet, Take 1 tablet (50 mg total) by mouth every 6 (six) hours as needed., Disp: 40 tablet, Rfl: 0  Physical exam:  Vitals:   07/09/23 1021  BP: (!) 143/96  Pulse: (!) 55  Resp: 19  Temp: (!) 97.4 F (36.3 C)  TempSrc: Tympanic  SpO2: 100%  Weight: 142 lb 3.2 oz (64.5 kg)  Height: 5\' 5"  (1.651 m)   Physical Exam Cardiovascular:     Rate and Rhythm: Normal rate and regular rhythm.     Heart sounds: Normal heart sounds.  Pulmonary:     Effort: Pulmonary effort is normal.     Breath sounds: Normal breath sounds.  Skin:    General: Skin is warm and dry.  Neurological:     Mental Status: He is alert and oriented to person, place, and time.      I have personally reviewed labs listed below:    Latest Ref Rng & Units 01/01/2023    2:18 AM  CMP  Glucose 70 - 99 mg/dL 161   BUN 8 - 23 mg/dL 10   Creatinine 0.96 - 1.24 mg/dL 0.45   Sodium 409 - 811 mmol/L 135   Potassium 3.5 - 5.1 mmol/L 3.5   Chloride 98 - 111 mmol/L 103   CO2 22 - 32 mmol/L 25   Calcium 8.9 -  10.3 mg/dL 8.5       Latest Ref Rng & Units 01/01/2023    2:18 AM  CBC  WBC 4.0 - 10.5 K/uL 14.2   Hemoglobin 13.0 - 17.0 g/dL 91.4   Hematocrit 78.2 - 52.0 % 33.0   Platelets 150 - 400 K/uL 420    I have personally reviewed Radiology images listed below: No images are attached to the encounter.  CT Chest Wo Contrast Result Date: 07/09/2023 CLINICAL DATA:  Right upper lobe lung cancer. Restaging. * Tracking Code: BO * EXAM: CT CHEST WITHOUT CONTRAST TECHNIQUE: Multidetector CT imaging of the chest was performed following the standard protocol without IV contrast. RADIATION DOSE REDUCTION: This exam was performed according to the departmental dose-optimization program which includes automated exposure control, adjustment of the mA and/or kV according to patient size and/or use of iterative reconstruction technique. COMPARISON:  PET-CT 10/31/2022.  Chest CT 10/07/2022. FINDINGS: Cardiovascular: The heart size is normal. No substantial pericardial effusion. Mild atherosclerotic calcification is noted in the wall of the thoracic aorta. Coronary artery calcification is evident. Mediastinum/Nodes: No mediastinal lymphadenopathy. No evidence for gross hilar lymphadenopathy although assessment is limited by the lack of intravenous contrast on the current study. The esophagus has normal imaging features. There is no axillary lymphadenopathy. Lungs/Pleura: Centrilobular emphsyema noted. Volume loss right hemithorax consistent with interval right upper lobectomy. Surgical changes are noted in the right hilum. Loculated pleural fluid is seen in the right lung apex. Trace dependent right pleural effusion associated. Pleuroparenchymal scarring again noted left lung apex. Sub solid left upper lobe lesion measured at 1.7 cm previously is not substantially changed at 1.8 cm today (38/4). No new suspicious pulmonary nodule or mass. No focal airspace consolidation. Upper Abdomen: The liver shows diffusely decreased  attenuation suggesting fat deposition. 5.2 cm cyst in the upper pole right kidney is stable. No followup imaging is recommended. Stable 2.5 cm right adrenal nodule with areas of low-density. 2 cm left adrenal nodule unchanged, compatible with benign adrenal adenoma. Musculoskeletal: No worrisome  lytic or sclerotic osseous abnormality. IMPRESSION: 1. Interval right upper lobectomy with loculated pleural fluid in the right lung apex. Trace dependent right pleural effusion associated. 2. Stable sub solid left upper lobe lesion measuring 1.8 cm. 3. Stable bilateral adrenal nodules, compatible with benign adrenal adenomas. 4. Hepatic steatosis. 5.  Emphysema (ICD10-J43.9) and Aortic Atherosclerosis (ICD10-170.0) Electronically Signed   By: Kennith Center M.D.   On: 07/09/2023 09:27     Assessment and plan- Patient is a 73 y.o. male  with history of stage Ia adenocarcinoma of the right upper lobe of the lung pT1b N0 M0 s/p right upper lobectomy.  He is currently in remission and this is a routine follow-up visit to discuss CT scan results and further Management  I have reviewed CT chest images independently and discussed findings with the patient which does not show any evidence of recurrent or progressive disease.  Stable right upper lobectomy changes with loculated pleural fluid in the right apex.  He has stable left upper lobe nodule as well as bilateral adrenal nodules as well which are likely benign.  I will see him back in 6 months with CT chest without contrast prior   Visit Diagnosis 1. Encounter for follow-up surveillance of lung cancer      Dr. Owens Shark, MD, MPH [facility] [phone] 07/09/2023 12:33 PM

## 2023-07-22 DIAGNOSIS — I739 Peripheral vascular disease, unspecified: Secondary | ICD-10-CM | POA: Diagnosis not present

## 2023-07-22 DIAGNOSIS — R7303 Prediabetes: Secondary | ICD-10-CM | POA: Diagnosis not present

## 2023-07-22 DIAGNOSIS — C3491 Malignant neoplasm of unspecified part of right bronchus or lung: Secondary | ICD-10-CM | POA: Diagnosis not present

## 2023-07-22 DIAGNOSIS — E785 Hyperlipidemia, unspecified: Secondary | ICD-10-CM | POA: Diagnosis not present

## 2023-07-29 DIAGNOSIS — E785 Hyperlipidemia, unspecified: Secondary | ICD-10-CM | POA: Diagnosis not present

## 2023-07-29 DIAGNOSIS — I739 Peripheral vascular disease, unspecified: Secondary | ICD-10-CM | POA: Diagnosis not present

## 2023-07-29 DIAGNOSIS — K860 Alcohol-induced chronic pancreatitis: Secondary | ICD-10-CM | POA: Diagnosis not present

## 2023-07-29 DIAGNOSIS — E278 Other specified disorders of adrenal gland: Secondary | ICD-10-CM | POA: Diagnosis not present

## 2023-07-29 DIAGNOSIS — N200 Calculus of kidney: Secondary | ICD-10-CM | POA: Diagnosis not present

## 2023-07-29 DIAGNOSIS — F325 Major depressive disorder, single episode, in full remission: Secondary | ICD-10-CM | POA: Diagnosis not present

## 2023-07-29 DIAGNOSIS — Z0000 Encounter for general adult medical examination without abnormal findings: Secondary | ICD-10-CM | POA: Diagnosis not present

## 2023-07-29 DIAGNOSIS — I700 Atherosclerosis of aorta: Secondary | ICD-10-CM | POA: Diagnosis not present

## 2023-07-29 DIAGNOSIS — E538 Deficiency of other specified B group vitamins: Secondary | ICD-10-CM | POA: Diagnosis not present

## 2023-07-29 DIAGNOSIS — Z85118 Personal history of other malignant neoplasm of bronchus and lung: Secondary | ICD-10-CM | POA: Diagnosis not present

## 2023-07-29 DIAGNOSIS — D649 Anemia, unspecified: Secondary | ICD-10-CM | POA: Diagnosis not present

## 2023-07-29 DIAGNOSIS — J449 Chronic obstructive pulmonary disease, unspecified: Secondary | ICD-10-CM | POA: Diagnosis not present

## 2023-07-29 DIAGNOSIS — R7303 Prediabetes: Secondary | ICD-10-CM | POA: Diagnosis not present

## 2023-07-29 DIAGNOSIS — I69359 Hemiplegia and hemiparesis following cerebral infarction affecting unspecified side: Secondary | ICD-10-CM | POA: Diagnosis not present

## 2023-10-27 ENCOUNTER — Other Ambulatory Visit: Payer: Self-pay | Admitting: Pulmonary Disease

## 2023-10-27 DIAGNOSIS — C3411 Malignant neoplasm of upper lobe, right bronchus or lung: Secondary | ICD-10-CM

## 2023-12-31 ENCOUNTER — Ambulatory Visit: Admission: RE | Admit: 2023-12-31 | Source: Ambulatory Visit

## 2024-01-02 ENCOUNTER — Ambulatory Visit

## 2024-01-14 ENCOUNTER — Inpatient Hospital Stay: Attending: Oncology

## 2024-01-14 ENCOUNTER — Encounter: Payer: Self-pay | Admitting: Oncology

## 2024-01-14 ENCOUNTER — Inpatient Hospital Stay: Admitting: Oncology

## 2024-01-14 ENCOUNTER — Telehealth: Payer: Self-pay

## 2024-01-14 NOTE — Telephone Encounter (Signed)
 Patient did not appear for clinic appointment scheduled today nor had CT scan done prior. Dr. Melanee requested I contact [facility] Dr. Ophelia Sage (last OV with them 07/29/23) to inform it there is no point in us  monitoring his lung cancer if his scans are not being completed (last scan completed 06/25/23).  Dr. Melanee can schedule a follow up appointment once his scan has been completed.  Also inquire if there is another point of contact on file to verify appointments with.  Outbound call; left message with front desk for Sherrie RN to call back when available.

## 2024-01-15 ENCOUNTER — Telehealth: Payer: Self-pay | Admitting: *Deleted

## 2024-01-15 NOTE — Telephone Encounter (Signed)
 Per Sherry's message today 10/9 at 2:16PM [facility] Dr.Klein says call back and hopefully talk about this patientcall [phone].  Outbound call; was told Lauraine called center today but they have another nurse covering.  Covering nurse has left for the day; will send a message to contact clinic tomorrow.

## 2024-01-16 ENCOUNTER — Telehealth: Payer: Self-pay | Admitting: *Deleted

## 2024-01-16 NOTE — Telephone Encounter (Signed)
 Patient called because he said somebody else called yesterday which is me.  There route would like him to get a CT chest so that we can see if there is any problems with your lung cancer.  Has been going very well but we need to have CT to be done.  Patient says he is okay with the CT scan and I put it in 07/17/2022 and patient would like to have it at 10 AM if possible.  I sent a message to Lauren she is the person that does the CTs and I asked her if she can get it on 1024 and also that he would like 10 AM.  I also asked Lauren to make sure that she called him to give them the date and the time so we will be able to do it.

## 2024-01-16 NOTE — Telephone Encounter (Addendum)
 Per Sherry's message today 10/9 at 2:16PM [facility] Dr.Klein says call back and hopefully talk about this patientcall [phone].  Outbound call; was told Lauraine called center today but they have another nurse covering.  Covering nurse has left for the day; will send a message to contact clinic tomorrow.

## 2024-01-19 ENCOUNTER — Other Ambulatory Visit: Payer: Self-pay | Admitting: *Deleted

## 2024-01-19 DIAGNOSIS — C3411 Malignant neoplasm of upper lobe, right bronchus or lung: Secondary | ICD-10-CM

## 2024-01-19 NOTE — Progress Notes (Signed)
 stage IA 2 adenocarcinoma with lepidic pattern of the left upper lobe s/p surgery

## 2024-01-20 ENCOUNTER — Encounter: Payer: Self-pay | Admitting: Oncology

## 2024-01-28 NOTE — Telephone Encounter (Signed)
 Spoke to [place] and relayed that patient has been rescheduled for CT on 02/02/24 (currently no f/u appointment scheduled at this time).  Will keep an eye out and see if patient completes CT scan.  Verneita will relay this information to his nurse Sherri.  Only alternate POC on file is wife Earnie (which we already have listed) and confirmed the cell phone number we have on file for patient is indeed correct.  If patient appears for CT scan scheduled 10/27 will touch base with Dr. Melanee to discuss when f/u should be.

## 2024-01-29 ENCOUNTER — Telehealth: Payer: Self-pay | Admitting: Oncology

## 2024-01-29 NOTE — Telephone Encounter (Signed)
 Called to confirm CT on 10/27 - left vm with appt details as a reminder - LH

## 2024-02-02 ENCOUNTER — Ambulatory Visit: Admission: RE | Admit: 2024-02-02 | Source: Ambulatory Visit

## 2024-02-03 NOTE — Telephone Encounter (Signed)
 Patient no showed for CT on 02/02/24.

## 2024-02-06 NOTE — Telephone Encounter (Signed)
 [facility] Dr.Klein says call back and hopefully talk about this patientcall [phone]

## 2024-03-08 ENCOUNTER — Encounter: Payer: Self-pay | Admitting: Oncology

## 2024-03-08 ENCOUNTER — Telehealth: Payer: Self-pay | Admitting: Oncology

## 2024-03-08 DIAGNOSIS — Z23 Encounter for immunization: Secondary | ICD-10-CM | POA: Diagnosis not present

## 2024-03-08 NOTE — Telephone Encounter (Signed)
 Called pt spouse to r/s missed CT - pt spouse confirmed date/time/location - pt spouse requested appt reminder via mail - LH

## 2024-03-16 ENCOUNTER — Ambulatory Visit: Admission: RE | Admit: 2024-03-16 | Discharge: 2024-03-16 | Attending: Oncology

## 2024-03-16 DIAGNOSIS — C3411 Malignant neoplasm of upper lobe, right bronchus or lung: Secondary | ICD-10-CM | POA: Diagnosis not present

## 2024-04-11 ENCOUNTER — Observation Stay (HOSPITAL_COMMUNITY)

## 2024-04-11 ENCOUNTER — Emergency Department

## 2024-04-11 ENCOUNTER — Emergency Department
Admission: EM | Admit: 2024-04-11 | Discharge: 2024-04-11 | Disposition: A | Discharge status: Other Acute Inpatient Hospital | Attending: Emergency Medicine | Admitting: Emergency Medicine

## 2024-04-11 ENCOUNTER — Encounter (HOSPITAL_COMMUNITY): Payer: Self-pay

## 2024-04-11 ENCOUNTER — Inpatient Hospital Stay (HOSPITAL_COMMUNITY)
Admission: EM | Admit: 2024-04-11 | Discharge: 2024-04-23 | DRG: 199 | Disposition: A | Discharge status: Home / Self Care | Attending: General Surgery | Admitting: General Surgery

## 2024-04-11 ENCOUNTER — Encounter: Payer: Self-pay | Admitting: Emergency Medicine

## 2024-04-11 ENCOUNTER — Other Ambulatory Visit: Payer: Self-pay

## 2024-04-11 DIAGNOSIS — R1310 Dysphagia, unspecified: Secondary | ICD-10-CM | POA: Diagnosis present

## 2024-04-11 DIAGNOSIS — R54 Age-related physical debility: Secondary | ICD-10-CM | POA: Diagnosis present

## 2024-04-11 DIAGNOSIS — Z8673 Personal history of transient ischemic attack (TIA), and cerebral infarction without residual deficits: Secondary | ICD-10-CM

## 2024-04-11 DIAGNOSIS — K219 Gastro-esophageal reflux disease without esophagitis: Secondary | ICD-10-CM | POA: Diagnosis present

## 2024-04-11 DIAGNOSIS — Z79899 Other long term (current) drug therapy: Secondary | ICD-10-CM

## 2024-04-11 DIAGNOSIS — Z800 Family history of malignant neoplasm of digestive organs: Secondary | ICD-10-CM

## 2024-04-11 DIAGNOSIS — J439 Emphysema, unspecified: Secondary | ICD-10-CM | POA: Diagnosis present

## 2024-04-11 DIAGNOSIS — R410 Disorientation, unspecified: Secondary | ICD-10-CM | POA: Diagnosis present

## 2024-04-11 DIAGNOSIS — Z85118 Personal history of other malignant neoplasm of bronchus and lung: Secondary | ICD-10-CM | POA: Insufficient documentation

## 2024-04-11 DIAGNOSIS — J440 Chronic obstructive pulmonary disease with acute lower respiratory infection: Secondary | ICD-10-CM | POA: Diagnosis present

## 2024-04-11 DIAGNOSIS — T797XXA Traumatic subcutaneous emphysema, initial encounter: Secondary | ICD-10-CM | POA: Diagnosis present

## 2024-04-11 DIAGNOSIS — J449 Chronic obstructive pulmonary disease, unspecified: Secondary | ICD-10-CM | POA: Diagnosis not present

## 2024-04-11 DIAGNOSIS — Y92410 Unspecified street and highway as the place of occurrence of the external cause: Secondary | ICD-10-CM | POA: Diagnosis not present

## 2024-04-11 DIAGNOSIS — S2242XA Multiple fractures of ribs, left side, initial encounter for closed fracture: Secondary | ICD-10-CM | POA: Insufficient documentation

## 2024-04-11 DIAGNOSIS — Z902 Acquired absence of lung [part of]: Secondary | ICD-10-CM

## 2024-04-11 DIAGNOSIS — D181 Lymphangioma, any site: Secondary | ICD-10-CM | POA: Insufficient documentation

## 2024-04-11 DIAGNOSIS — D649 Anemia, unspecified: Secondary | ICD-10-CM | POA: Diagnosis present

## 2024-04-11 DIAGNOSIS — J15211 Pneumonia due to Methicillin susceptible Staphylococcus aureus: Secondary | ICD-10-CM | POA: Diagnosis present

## 2024-04-11 DIAGNOSIS — S0083XA Contusion of other part of head, initial encounter: Secondary | ICD-10-CM | POA: Diagnosis not present

## 2024-04-11 DIAGNOSIS — Z7982 Long term (current) use of aspirin: Secondary | ICD-10-CM

## 2024-04-11 DIAGNOSIS — E785 Hyperlipidemia, unspecified: Secondary | ICD-10-CM | POA: Diagnosis present

## 2024-04-11 DIAGNOSIS — S299XXA Unspecified injury of thorax, initial encounter: Secondary | ICD-10-CM | POA: Diagnosis present

## 2024-04-11 DIAGNOSIS — S270XXA Traumatic pneumothorax, initial encounter: Secondary | ICD-10-CM | POA: Insufficient documentation

## 2024-04-11 DIAGNOSIS — N179 Acute kidney failure, unspecified: Secondary | ICD-10-CM | POA: Diagnosis present

## 2024-04-11 DIAGNOSIS — S27321A Contusion of lung, unilateral, initial encounter: Secondary | ICD-10-CM | POA: Diagnosis present

## 2024-04-11 DIAGNOSIS — G9608 Other cranial cerebrospinal fluid leak: Secondary | ICD-10-CM | POA: Diagnosis present

## 2024-04-11 DIAGNOSIS — N281 Cyst of kidney, acquired: Secondary | ICD-10-CM | POA: Diagnosis present

## 2024-04-11 DIAGNOSIS — I739 Peripheral vascular disease, unspecified: Secondary | ICD-10-CM | POA: Diagnosis present

## 2024-04-11 DIAGNOSIS — J939 Pneumothorax, unspecified: Secondary | ICD-10-CM

## 2024-04-11 DIAGNOSIS — K5730 Diverticulosis of large intestine without perforation or abscess without bleeding: Secondary | ICD-10-CM | POA: Diagnosis present

## 2024-04-11 DIAGNOSIS — S2220XA Unspecified fracture of sternum, initial encounter for closed fracture: Secondary | ICD-10-CM | POA: Diagnosis present

## 2024-04-11 DIAGNOSIS — S32019A Unspecified fracture of first lumbar vertebra, initial encounter for closed fracture: Secondary | ICD-10-CM | POA: Diagnosis present

## 2024-04-11 DIAGNOSIS — Z720 Tobacco use: Secondary | ICD-10-CM

## 2024-04-11 DIAGNOSIS — Z85828 Personal history of other malignant neoplasm of skin: Secondary | ICD-10-CM

## 2024-04-11 DIAGNOSIS — F32A Depression, unspecified: Secondary | ICD-10-CM | POA: Diagnosis present

## 2024-04-11 DIAGNOSIS — J9601 Acute respiratory failure with hypoxia: Secondary | ICD-10-CM | POA: Diagnosis present

## 2024-04-11 LAB — TYPE AND SCREEN
ABO/RH(D): A POS
Antibody Screen: NEGATIVE

## 2024-04-11 LAB — COMPREHENSIVE METABOLIC PANEL WITH GFR
ALT: 51 U/L — ABNORMAL HIGH (ref 0–44)
AST: 55 U/L — ABNORMAL HIGH (ref 15–41)
Albumin: 4 g/dL (ref 3.5–5.0)
Alkaline Phosphatase: 70 U/L (ref 38–126)
Anion gap: 11 (ref 5–15)
BUN: 18 mg/dL (ref 8–23)
CO2: 25 mmol/L (ref 22–32)
Calcium: 10.1 mg/dL (ref 8.9–10.3)
Chloride: 103 mmol/L (ref 98–111)
Creatinine, Ser: 0.76 mg/dL (ref 0.61–1.24)
GFR, Estimated: >60 mL/min
Glucose, Bld: 143 mg/dL — ABNORMAL HIGH (ref 70–99)
Potassium: 4.1 mmol/L (ref 3.5–5.1)
Sodium: 139 mmol/L (ref 135–145)
Total Bilirubin: 1.4 mg/dL — ABNORMAL HIGH (ref 0.0–1.2)
Total Protein: 6.9 g/dL (ref 6.5–8.1)

## 2024-04-11 LAB — CBC
HCT: 33.9 % — ABNORMAL LOW (ref 39.0–52.0)
Hemoglobin: 10.8 g/dL — ABNORMAL LOW (ref 13.0–17.0)
MCH: 28.3 pg (ref 26.0–34.0)
MCHC: 31.9 g/dL (ref 30.0–36.0)
MCV: 88.7 fL (ref 80.0–100.0)
Platelets: 261 K/uL (ref 150–400)
RBC: 3.82 MIL/uL — ABNORMAL LOW (ref 4.22–5.81)
RDW: 15.9 % — ABNORMAL HIGH (ref 11.5–15.5)
WBC: 7.7 K/uL (ref 4.0–10.5)
nRBC: 0 % (ref 0.0–0.2)

## 2024-04-11 LAB — URINALYSIS, ROUTINE W REFLEX MICROSCOPIC
Bilirubin Urine: NEGATIVE
Glucose, UA: NEGATIVE mg/dL
Hgb urine dipstick: NEGATIVE
Ketones, ur: NEGATIVE mg/dL
Leukocytes,Ua: NEGATIVE
Nitrite: NEGATIVE
Protein, ur: NEGATIVE mg/dL
Specific Gravity, Urine: >1.046 — ABNORMAL HIGH (ref 1.005–1.030)
pH: 5 (ref 5.0–8.0)

## 2024-04-11 LAB — LACTIC ACID, PLASMA: Lactic Acid, Venous: 1.2 mmol/L (ref 0.5–1.9)

## 2024-04-11 LAB — PROTIME-INR
INR: 1 (ref 0.8–1.2)
Prothrombin Time: 13.9 s (ref 11.4–15.2)

## 2024-04-11 MED ORDER — SENNA 8.6 MG PO TABS
1.0000 | ORAL_TABLET | Freq: Two times a day (BID) | ORAL | Status: DC
  Administered 2024-04-11 – 2024-04-22 (×18): 8.6 mg via ORAL
  Filled 2024-04-11 (×22): qty 1

## 2024-04-11 MED ORDER — FENTANYL CITRATE (PF) 50 MCG/ML IJ SOSY
50.0000 ug | PREFILLED_SYRINGE | Freq: Once | INTRAMUSCULAR | Status: DC
  Filled 2024-04-11: qty 1

## 2024-04-11 MED ORDER — KETOROLAC TROMETHAMINE 15 MG/ML IJ SOLN
15.0000 mg | Freq: Four times a day (QID) | INTRAMUSCULAR | Status: DC
  Administered 2024-04-11 – 2024-04-13 (×6): 15 mg via INTRAVENOUS
  Filled 2024-04-11 (×6): qty 1

## 2024-04-11 MED ORDER — ONDANSETRON 4 MG PO TBDP: 4.0000 mg | ORAL_TABLET | Freq: Four times a day (QID) | ORAL | Status: DC | PRN

## 2024-04-11 MED ORDER — FENTANYL CITRATE (PF) 50 MCG/ML IJ SOSY
50.0000 ug | PREFILLED_SYRINGE | Freq: Once | INTRAMUSCULAR | Status: AC | PRN
  Administered 2024-04-11: 50 ug via INTRAVENOUS

## 2024-04-11 MED ORDER — HYDRALAZINE HCL 20 MG/ML IJ SOLN: 10.0000 mg | INTRAMUSCULAR | Status: DC | PRN

## 2024-04-11 MED ORDER — GABAPENTIN 100 MG PO CAPS
200.0000 mg | ORAL_CAPSULE | Freq: Three times a day (TID) | ORAL | Status: DC
  Administered 2024-04-11 – 2024-04-23 (×35): 200 mg via ORAL
  Filled 2024-04-11 (×35): qty 2

## 2024-04-11 MED ORDER — METHOCARBAMOL 1000 MG/10ML IJ SOLN
500.0000 mg | Freq: Three times a day (TID) | INTRAMUSCULAR | Status: DC
  Filled 2024-04-11 (×8): qty 5

## 2024-04-11 MED ORDER — ONDANSETRON HCL 4 MG/2ML IJ SOLN
4.0000 mg | Freq: Four times a day (QID) | INTRAMUSCULAR | Status: DC | PRN
  Administered 2024-04-19: 4 mg via INTRAVENOUS
  Filled 2024-04-11: qty 2

## 2024-04-11 MED ORDER — LIDOCAINE HCL (PF) 1 % IJ SOLN
INTRAMUSCULAR | Status: AC
  Filled 2024-04-11: qty 10

## 2024-04-11 MED ORDER — ONDANSETRON HCL 4 MG/2ML IJ SOLN
4.0000 mg | INTRAMUSCULAR | Status: AC
  Administered 2024-04-11: 4 mg via INTRAVENOUS
  Filled 2024-04-11: qty 2

## 2024-04-11 MED ORDER — POLYETHYLENE GLYCOL 3350 17 G PO PACK
17.0000 g | PACK | Freq: Every day | ORAL | Status: DC | PRN
  Administered 2024-04-16 – 2024-04-18 (×2): 17 g via ORAL
  Filled 2024-04-11 (×2): qty 1

## 2024-04-11 MED ORDER — MORPHINE SULFATE (PF) 4 MG/ML IV SOLN
4.0000 mg | Freq: Once | INTRAVENOUS | Status: AC
  Administered 2024-04-11: 4 mg via INTRAVENOUS
  Filled 2024-04-11: qty 1

## 2024-04-11 MED ORDER — LACTATED RINGERS IV BOLUS
1000.0000 mL | Freq: Once | INTRAVENOUS | Status: AC
  Administered 2024-04-11: 1000 mL via INTRAVENOUS

## 2024-04-11 MED ORDER — ATORVASTATIN CALCIUM 80 MG PO TABS
80.0000 mg | ORAL_TABLET | Freq: Every day | ORAL | Status: DC
  Administered 2024-04-12 – 2024-04-23 (×12): 80 mg via ORAL
  Filled 2024-04-11 (×12): qty 1

## 2024-04-11 MED ORDER — ACETAMINOPHEN 500 MG PO TABS
1000.0000 mg | ORAL_TABLET | Freq: Four times a day (QID) | ORAL | Status: DC
  Administered 2024-04-11 – 2024-04-23 (×41): 1000 mg via ORAL
  Filled 2024-04-11 (×45): qty 2

## 2024-04-11 MED ORDER — LIDOCAINE HCL 1 % IJ SOLN
20.0000 mL | Freq: Once | INTRAMUSCULAR | Status: DC
  Filled 2024-04-11: qty 20

## 2024-04-11 MED ORDER — HYDROMORPHONE HCL 1 MG/ML IJ SOLN
1.0000 mg | INTRAMUSCULAR | Status: DC | PRN
  Administered 2024-04-11: 1 mg via INTRAVENOUS
  Filled 2024-04-11: qty 1

## 2024-04-11 MED ORDER — FLUOXETINE HCL 20 MG PO CAPS
40.0000 mg | ORAL_CAPSULE | Freq: Every day | ORAL | Status: DC
  Administered 2024-04-12 – 2024-04-23 (×12): 40 mg via ORAL
  Filled 2024-04-11 (×12): qty 2

## 2024-04-11 MED ORDER — OXYCODONE HCL 5 MG PO TABS
5.0000 mg | ORAL_TABLET | ORAL | Status: DC | PRN
  Administered 2024-04-12 – 2024-04-15 (×9): 10 mg via ORAL
  Administered 2024-04-16: 5 mg via ORAL
  Administered 2024-04-16 – 2024-04-19 (×4): 10 mg via ORAL
  Administered 2024-04-21 – 2024-04-22 (×2): 5 mg via ORAL
  Filled 2024-04-11: qty 2
  Filled 2024-04-11: qty 1
  Filled 2024-04-11: qty 2
  Filled 2024-04-11: qty 1
  Filled 2024-04-11 (×9): qty 2
  Filled 2024-04-11: qty 1
  Filled 2024-04-11 (×2): qty 2

## 2024-04-11 MED ORDER — METHOCARBAMOL 500 MG PO TABS
500.0000 mg | ORAL_TABLET | Freq: Three times a day (TID) | ORAL | Status: DC
  Administered 2024-04-11 – 2024-04-13 (×7): 500 mg via ORAL
  Filled 2024-04-11 (×7): qty 1

## 2024-04-11 MED ORDER — ENOXAPARIN SODIUM 30 MG/0.3ML IJ SOSY
30.0000 mg | PREFILLED_SYRINGE | Freq: Two times a day (BID) | INTRAMUSCULAR | Status: DC
  Administered 2024-04-12 – 2024-04-22 (×22): 30 mg via SUBCUTANEOUS
  Filled 2024-04-11 (×22): qty 0.3

## 2024-04-11 MED ORDER — IOHEXOL 300 MG/ML  SOLN
100.0000 mL | Freq: Once | INTRAMUSCULAR | Status: AC | PRN
  Administered 2024-04-11: 100 mL via INTRAVENOUS

## 2024-04-11 NOTE — Progress Notes (Signed)
" ° °  Providing Compassionate, Quality Care - Together   BP 119/63 (BP Location: Left Arm)   Pulse 86   Temp 97.9 F (36.6 C) (Oral)   Resp 19   Ht 5' 5 (1.651 m)   Wt 61.2 kg   SpO2 99%   BMI 22.45 kg/m    Patient sustained an L1 vertebral body fracture with approximately 30% anterior height loss. Posterior wall intact. Recommend lumbosacral corset with activity/for comfort.  CT C-spine also reviewed. Patient with extensive soft tissue gas within his neck with associated pneumomediastinum and left-sided pneumothorax. He has chronic degenerative changes in his cervical spine. No acute osseous abnormality.  CT head shows left cerebral convexity subdural hygroma, no midline shift, and a remote left MCA territory infarct. No acute intracranial abnormality.  Formal consult to follow in the morning.   Gerard Beck, DNP, AGNP-C Nurse Practitioner  [facility] [address], [address], [place], [place] [address] P: [phone]    F: [phone]  "
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
## 2024-04-11 NOTE — ED Notes (Signed)
 EMTALA reviewed by this RN.

## 2024-04-11 NOTE — ED Notes (Addendum)
 This RN attempted to call and give report to receiving RN, [facility] at bedside to transfer patient.

## 2024-04-11 NOTE — Progress Notes (Signed)
 The patient is admitted from [facility] ER to 2 C 15. He's A & O x 4.

## 2024-04-11 NOTE — Progress Notes (Signed)
" °  The patient is admitted from [facility] ER to 2 C 15. He's A & O x 4.     He is A &  O x 4. The patient is oriented to his room, staff and call bell. Dr. Ann at bedside assessing the patient . He voiced no concern at this time. Will continue to monitor. "
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
## 2024-04-11 NOTE — ED Notes (Signed)
 Attempted to give report to receiving RN at this time. RN reports she'll call back.

## 2024-04-11 NOTE — Progress Notes (Signed)
 Chest tube is being inserted now by Dr. Ann

## 2024-04-11 NOTE — ED Provider Notes (Signed)
 Signed out to Dr. Claudene will follow-up on patient while in our emergency department, and carry out remaining aspects of transfer to [facility] Dicky Oneil, MD 04/11/24 [phone]

## 2024-04-11 NOTE — Progress Notes (Signed)
 Called back and the RN she's in an emergency and can't give report .
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
## 2024-04-12 ENCOUNTER — Encounter (HOSPITAL_COMMUNITY): Payer: Self-pay

## 2024-04-12 ENCOUNTER — Observation Stay (HOSPITAL_COMMUNITY)

## 2024-04-12 DIAGNOSIS — J939 Pneumothorax, unspecified: Secondary | ICD-10-CM | POA: Diagnosis present

## 2024-04-12 DIAGNOSIS — I739 Peripheral vascular disease, unspecified: Secondary | ICD-10-CM | POA: Diagnosis present

## 2024-04-12 DIAGNOSIS — S2220XA Unspecified fracture of sternum, initial encounter for closed fracture: Secondary | ICD-10-CM | POA: Diagnosis present

## 2024-04-12 DIAGNOSIS — S27321A Contusion of lung, unilateral, initial encounter: Secondary | ICD-10-CM | POA: Diagnosis present

## 2024-04-12 DIAGNOSIS — S2242XA Multiple fractures of ribs, left side, initial encounter for closed fracture: Secondary | ICD-10-CM | POA: Diagnosis present

## 2024-04-12 DIAGNOSIS — K5730 Diverticulosis of large intestine without perforation or abscess without bleeding: Secondary | ICD-10-CM | POA: Diagnosis present

## 2024-04-12 DIAGNOSIS — R410 Disorientation, unspecified: Secondary | ICD-10-CM | POA: Diagnosis not present

## 2024-04-12 DIAGNOSIS — Z7982 Long term (current) use of aspirin: Secondary | ICD-10-CM | POA: Diagnosis not present

## 2024-04-12 DIAGNOSIS — J189 Pneumonia, unspecified organism: Secondary | ICD-10-CM | POA: Diagnosis not present

## 2024-04-12 DIAGNOSIS — J15211 Pneumonia due to Methicillin susceptible Staphylococcus aureus: Secondary | ICD-10-CM | POA: Diagnosis present

## 2024-04-12 DIAGNOSIS — T797XXA Traumatic subcutaneous emphysema, initial encounter: Secondary | ICD-10-CM | POA: Diagnosis present

## 2024-04-12 DIAGNOSIS — D649 Anemia, unspecified: Secondary | ICD-10-CM | POA: Diagnosis present

## 2024-04-12 DIAGNOSIS — Z85828 Personal history of other malignant neoplasm of skin: Secondary | ICD-10-CM | POA: Diagnosis not present

## 2024-04-12 DIAGNOSIS — F32A Depression, unspecified: Secondary | ICD-10-CM | POA: Diagnosis present

## 2024-04-12 DIAGNOSIS — N281 Cyst of kidney, acquired: Secondary | ICD-10-CM | POA: Diagnosis present

## 2024-04-12 DIAGNOSIS — G9608 Other cranial cerebrospinal fluid leak: Secondary | ICD-10-CM | POA: Diagnosis present

## 2024-04-12 DIAGNOSIS — S32019A Unspecified fracture of first lumbar vertebra, initial encounter for closed fracture: Secondary | ICD-10-CM | POA: Diagnosis present

## 2024-04-12 DIAGNOSIS — J9601 Acute respiratory failure with hypoxia: Secondary | ICD-10-CM | POA: Diagnosis present

## 2024-04-12 DIAGNOSIS — S270XXA Traumatic pneumothorax, initial encounter: Secondary | ICD-10-CM | POA: Diagnosis present

## 2024-04-12 DIAGNOSIS — Y92410 Unspecified street and highway as the place of occurrence of the external cause: Secondary | ICD-10-CM | POA: Diagnosis not present

## 2024-04-12 DIAGNOSIS — Z902 Acquired absence of lung [part of]: Secondary | ICD-10-CM | POA: Diagnosis not present

## 2024-04-12 DIAGNOSIS — J440 Chronic obstructive pulmonary disease with acute lower respiratory infection: Secondary | ICD-10-CM | POA: Diagnosis present

## 2024-04-12 DIAGNOSIS — E785 Hyperlipidemia, unspecified: Secondary | ICD-10-CM | POA: Diagnosis present

## 2024-04-12 DIAGNOSIS — N179 Acute kidney failure, unspecified: Secondary | ICD-10-CM | POA: Diagnosis present

## 2024-04-12 DIAGNOSIS — Z720 Tobacco use: Secondary | ICD-10-CM | POA: Diagnosis not present

## 2024-04-12 DIAGNOSIS — R54 Age-related physical debility: Secondary | ICD-10-CM | POA: Diagnosis present

## 2024-04-12 DIAGNOSIS — J439 Emphysema, unspecified: Secondary | ICD-10-CM | POA: Diagnosis present

## 2024-04-12 DIAGNOSIS — Z8673 Personal history of transient ischemic attack (TIA), and cerebral infarction without residual deficits: Secondary | ICD-10-CM | POA: Diagnosis not present

## 2024-04-12 LAB — CBC
HCT: 30.2 % — ABNORMAL LOW (ref 39.0–52.0)
Hemoglobin: 9.5 g/dL — ABNORMAL LOW (ref 13.0–17.0)
MCH: 28.2 pg (ref 26.0–34.0)
MCHC: 31.5 g/dL (ref 30.0–36.0)
MCV: 89.6 fL (ref 80.0–100.0)
Platelets: 238 K/uL (ref 150–400)
RBC: 3.37 MIL/uL — ABNORMAL LOW (ref 4.22–5.81)
RDW: 16 % — ABNORMAL HIGH (ref 11.5–15.5)
WBC: 7.5 K/uL (ref 4.0–10.5)
nRBC: 0 % (ref 0.0–0.2)

## 2024-04-12 LAB — BASIC METABOLIC PANEL WITH GFR
Anion gap: 7 (ref 5–15)
BUN: 17 mg/dL (ref 8–23)
CO2: 25 mmol/L (ref 22–32)
Calcium: 8.7 mg/dL — ABNORMAL LOW (ref 8.9–10.3)
Chloride: 104 mmol/L (ref 98–111)
Creatinine, Ser: 0.8 mg/dL (ref 0.61–1.24)
GFR, Estimated: >60 mL/min
Glucose, Bld: 113 mg/dL — ABNORMAL HIGH (ref 70–99)
Potassium: 4.2 mmol/L (ref 3.5–5.1)
Sodium: 136 mmol/L (ref 135–145)

## 2024-04-12 LAB — MRSA NEXT GEN BY PCR, NASAL: MRSA by PCR Next Gen: NOT DETECTED

## 2024-04-12 MED ORDER — IPRATROPIUM-ALBUTEROL 0.5-2.5 (3) MG/3ML IN SOLN
3.0000 mL | Freq: Four times a day (QID) | RESPIRATORY_TRACT | Status: DC | PRN
  Administered 2024-04-13 – 2024-04-14 (×2): 3 mL via RESPIRATORY_TRACT
  Filled 2024-04-12 (×2): qty 3

## 2024-04-12 MED ORDER — LORAZEPAM 1 MG PO TABS: 1.0000 mg | ORAL_TABLET | ORAL | Status: AC | PRN

## 2024-04-12 MED ORDER — GUAIFENESIN ER 600 MG PO TB12
600.0000 mg | ORAL_TABLET | Freq: Two times a day (BID) | ORAL | Status: DC
  Administered 2024-04-12 – 2024-04-23 (×23): 600 mg via ORAL
  Filled 2024-04-12 (×23): qty 1

## 2024-04-12 MED ORDER — THIAMINE HCL 100 MG/ML IJ SOLN: 100.0000 mg | Freq: Every day | INTRAMUSCULAR | Status: DC

## 2024-04-12 MED ORDER — ADULT MULTIVITAMIN W/MINERALS CH
1.0000 | ORAL_TABLET | Freq: Every day | ORAL | Status: DC
  Administered 2024-04-12 – 2024-04-23 (×12): 1 via ORAL
  Filled 2024-04-12 (×12): qty 1

## 2024-04-12 MED ORDER — HYDROMORPHONE HCL 1 MG/ML IJ SOLN: 1.0000 mg | INTRAMUSCULAR | Status: DC | PRN

## 2024-04-12 MED ORDER — LORAZEPAM 2 MG/ML IJ SOLN: 1.0000 mg | INTRAMUSCULAR | Status: AC | PRN

## 2024-04-12 MED ORDER — FOLIC ACID 1 MG PO TABS
1.0000 mg | ORAL_TABLET | Freq: Every day | ORAL | Status: DC
  Administered 2024-04-12 – 2024-04-23 (×12): 1 mg via ORAL
  Filled 2024-04-12 (×12): qty 1

## 2024-04-12 MED ORDER — THIAMINE MONONITRATE 100 MG PO TABS
100.0000 mg | ORAL_TABLET | Freq: Every day | ORAL | Status: DC
  Administered 2024-04-12 – 2024-04-23 (×12): 100 mg via ORAL
  Filled 2024-04-12 (×12): qty 1

## 2024-04-12 MED ORDER — HYDROMORPHONE HCL 1 MG/ML IJ SOLN
0.5000 mg | INTRAMUSCULAR | Status: DC | PRN
  Administered 2024-04-14 – 2024-04-18 (×4): 0.5 mg via INTRAVENOUS
  Filled 2024-04-12 (×4): qty 0.5

## 2024-04-12 MED ORDER — LIDOCAINE 5 % EX PTCH
3.0000 | MEDICATED_PATCH | CUTANEOUS | Status: DC
  Administered 2024-04-12 – 2024-04-23 (×9): 3 via TRANSDERMAL
  Filled 2024-04-12 (×12): qty 3

## 2024-04-12 MED ORDER — ORAL CARE MOUTH RINSE: 15.0000 mL | OROMUCOSAL | Status: DC | PRN
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
## 2024-04-12 NOTE — Progress Notes (Signed)
 Transition of Care [facility]) - CAGE-AID Screening   Patient Details  Name: Jeremy Morren Sr. MRN: [id number] Date of Birth: 03/18/1951   MARINDA LIONEL Sora, RN Phone Number: 04/12/2024, 1:11 AM   Clinical Narrative: Pt reports daily etoh usage prior to 2 days ago, has stopped for periods of time in the past without w/d s/s. Pt reports cigarette smoking until 2 days ago, since 74years old. Denies drug usage.  -MD aware, will order CIWA protocol   CAGE-AID Screening:    Have You Ever Felt You Ought to Cut Down on Your Drinking or Drug Use?: No Have People Annoyed You By Critizing Your Drinking Or Drug Use?: No Have You Felt Bad Or Guilty About Your Drinking Or Drug Use?: No Have You Ever Had a Drink or Used Drugs First Thing In The Morning to Steady Your Nerves or to Get Rid of a Hangover?: No CAGE-AID Score: 0  Substance Abuse Education Offered: Yes

## 2024-04-12 NOTE — Progress Notes (Signed)
 Orthopedic Tech Progress Note Patient Details:  Jeremy Sally Sr. Apr 08, 1951 [id number]  Ortho Devices Type of Ortho Device: Lumbar corsett Ortho Device/Splint Location: BACK Ortho Device/Splint Interventions: Ordered, Application, Adjustment, Removal   Post Interventions Patient Tolerated: Well Instructions Provided: Care of device  [name] Pac 04/12/2024, 9:10 AM
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
## 2024-04-12 NOTE — Procedures (Signed)
 Chest tube insertion  Date/Time: 04/12/2024 5:07 AM  Performed by: Ann Fine, MD Authorized by: Ann Fine, MD   Consent:    Consent obtained:  Verbal   Consent given by:  Patient   Risks discussed:  Bleeding, incomplete drainage, damage to surrounding structures, infection, pain and nerve damage Pre-procedure details:    Skin preparation:  ChloraPrep Anesthesia (see MAR for exact dosages):    Anesthesia method:  Local infiltration   Local anesthetic:  Lidocaine  1% w/o epi Procedure details:    Placement location:  L anterior   Scalpel size:  11   Tube size (Fr):  Minicatheter   Technique: Seldinger     Ultrasound guidance: no     Tension pneumothorax: no     Tube connected to:  Suction   Drainage characteristics:  Air only   Suture material:  2-0 silk   Dressing:  Xeroform gauze and 4x4 sterile gauze Post-procedure details:    Post-insertion x-ray findings: tube in good position     Patient tolerance of procedure:  Tolerated well, no immediate complications

## 2024-04-12 NOTE — Progress Notes (Signed)
..  Trauma Event Note    Reason for Call :  Assisted Dr. Ann with chest tube insertion. Left side pigtail inserted without incident, connected to [place]aha collection system. Confirmation xray completed.  Primary RN Roselie medicating pt for pain and IS placed at bedside. TRN contact information provided for any further assistance.  Last imported Vital Signs BP 98/61   Pulse 87   Temp 97.9 F (36.6 C) (Oral)   Resp 14   Ht 5' 5 (1.651 m)   Wt 134 lb 14.7 oz (61.2 kg)   SpO2 95%   BMI 22.45 kg/m   Trending CBC Recent Labs    04/11/24 1131 04/12/24 0113  WBC 7.7 7.5  HGB 10.8* 9.5*  HCT 33.9* 30.2*  PLT 261 238    Trending Coag's Recent Labs    04/11/24 1131  INR 1.0    Trending BMET Recent Labs    04/11/24 1131  NA 139  K 4.1  CL 103  CO2 25  BUN 18  CREATININE 0.76  GLUCOSE 143*      Dysen Edmondson Dee  Trauma Response RN  Please call TRN at [phone] for further assistance.

## 2024-04-12 NOTE — Care Management Obs Status (Signed)
 MEDICARE OBSERVATION STATUS NOTIFICATION   Patient Details  Name: Jeremy Mcdonagh Sr. MRN: [id number] Date of Birth: 07/21/1950   Medicare Observation Status Notification Given:  Yes  Obs signed and copy given   Claretta Deed 04/12/2024, 2:38 PM
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
## 2024-04-12 NOTE — Plan of Care (Signed)
" °  Problem: Education: Goal: Knowledge of General Education information will improve Description: Including pain rating scale, medication(s)/side effects and non-pharmacologic comfort measures Outcome: Progressing   Problem: Clinical Measurements: Goal: Will remain free from infection Outcome: Progressing   Problem: Pain Managment: Goal: General experience of comfort will improve and/or be controlled Outcome: Progressing   Problem: Clinical Measurements: Goal: Respiratory complications will improve Outcome: Not Progressing   Problem: Activity: Goal: Risk for activity intolerance will decrease Outcome: Not Progressing   "

## 2024-04-13 ENCOUNTER — Inpatient Hospital Stay (HOSPITAL_COMMUNITY)

## 2024-04-13 LAB — CBC
HCT: 31.2 % — ABNORMAL LOW (ref 39.0–52.0)
Hemoglobin: 9.9 g/dL — ABNORMAL LOW (ref 13.0–17.0)
MCH: 28.4 pg (ref 26.0–34.0)
MCHC: 31.7 g/dL (ref 30.0–36.0)
MCV: 89.4 fL (ref 80.0–100.0)
Platelets: 266 K/uL (ref 150–400)
RBC: 3.49 MIL/uL — ABNORMAL LOW (ref 4.22–5.81)
RDW: 16 % — ABNORMAL HIGH (ref 11.5–15.5)
WBC: 7.6 K/uL (ref 4.0–10.5)
nRBC: 0 % (ref 0.0–0.2)

## 2024-04-13 LAB — BASIC METABOLIC PANEL WITH GFR
Anion gap: 8 (ref 5–15)
BUN: 28 mg/dL — ABNORMAL HIGH (ref 8–23)
CO2: 26 mmol/L (ref 22–32)
Calcium: 9.1 mg/dL (ref 8.9–10.3)
Chloride: 102 mmol/L (ref 98–111)
Creatinine, Ser: 1.42 mg/dL — ABNORMAL HIGH (ref 0.61–1.24)
GFR, Estimated: 52 mL/min — ABNORMAL LOW
Glucose, Bld: 107 mg/dL — ABNORMAL HIGH (ref 70–99)
Potassium: 4.4 mmol/L (ref 3.5–5.1)
Sodium: 136 mmol/L (ref 135–145)

## 2024-04-13 MED ORDER — LACTATED RINGERS IV SOLN: INTRAVENOUS | Status: AC

## 2024-04-13 MED ORDER — LACTATED RINGERS IV BOLUS
500.0000 mL | Freq: Once | INTRAVENOUS | Status: AC
  Administered 2024-04-13: 500 mL via INTRAVENOUS

## 2024-04-13 NOTE — Plan of Care (Signed)
  Problem: Clinical Measurements: Goal: Diagnostic test results will improve Outcome: Progressing Goal: Respiratory complications will improve Outcome: Progressing   Problem: Activity: Goal: Risk for activity intolerance will decrease Outcome: Progressing   Problem: Coping: Goal: Level of anxiety will decrease Outcome: Progressing   Problem: Pain Managment: Goal: General experience of comfort will improve and/or be controlled Outcome: Progressing   Problem: Safety: Goal: Ability to remain free from injury will improve Outcome: Progressing

## 2024-04-13 NOTE — TOC Progression Note (Signed)
 Transition of Care (TOC) - Progression Note    Patient Details  Name: Jeremy Marchi Sr. MRN: [id number] Date of Birth: June 17, 1950  Transition of [facility]) CM/SW Contact  Kedrick Mcnamee, Mliss HERO, RN Phone Number: 04/13/2024, 12:16pm  Clinical Narrative:    Met with patient to discuss discharge plans: he states that he lives at home with his wife. He is uncertain if his wife will help him at home, as he states she is mad at me.  He has two adult sons who live nearby, who can assist, if needed.  Patient currently on oxygen, but states is not on home 02.  PT/OT recommending HH follow up, and patient agreeable to services; referral to [facility] for continued home therapies.  RW recommended, but patient states he has one at home.  Patient's PCP is Dr. Ophelia Sage.  Inpatient Care Manager will continue to follow for home needs as patient progresses.    Expected Discharge Plan: Home w Home Health Services Barriers to Discharge: Continued Medical Work up               Expected Discharge Plan and Services   Discharge Planning Services: CM Consult Post Acute Care Choice: Home Health Living arrangements for the past 2 months: Single Family Home                           HH Arranged: PT, OT HH Agency: [facility] Date [facility] Agency Contacted: 04/13/24 Time HH Agency Contacted: 1415 Representative spoke with at [facility] Agency: [place]   Social Drivers of Health (SDOH) Interventions SDOH Screenings   Food Insecurity: No Food Insecurity (04/12/2024)  Housing: Low Risk (04/12/2024)  Transportation Needs: No Transportation Needs (07/29/2023)   Received from [facility]  Utilities: Not At Risk (07/29/2023)   Received from [facility]  Depression [phone]): Low Risk (10/28/2022)  Financial Resource Strain: Low Risk  (07/29/2023)   Received from [facility]  Social Connections: Moderately Isolated (04/12/2024)  Tobacco Use: Medium Risk  (04/12/2024)    Readmission Risk Interventions     No data to display         Mliss MICAEL Fass, RN, BSN  Trauma/Neuro ICU Case Manager [phone]

## 2024-04-13 NOTE — TOC Progression Note (Signed)
 Transition of Care (TOC) - Progression Note    Patient Details  Name: Jeremy Reha Sr. MRN: [id number] Date of Birth: 1951-02-06  Transition of Care [facility]) CM/SW Contact  Carmelita FORBES Carbon, LCSW Phone Number: 04/13/2024, 3:18 PM  Clinical Narrative:    ICM consulted for SA resources - resources added to AVS.   Expected Discharge Plan: Home w Home Health Services Barriers to Discharge: Continued Medical Work up               Expected Discharge Plan and Services   Discharge Planning Services: CM Consult Post Acute Care Choice: Home Health Living arrangements for the past 2 months: Single Family Home                           HH Arranged: PT, OT HH Agency: [facility] Date [facility] Agency Contacted: 04/13/24 Time HH Agency Contacted: 1415 Representative spoke with at [facility] Agency: [place]   Social Drivers of Health (SDOH) Interventions SDOH Screenings   Food Insecurity: No Food Insecurity (04/12/2024)  Housing: Low Risk (04/12/2024)  Transportation Needs: No Transportation Needs (07/29/2023)   Received from [facility]  Utilities: Not At Risk (07/29/2023)   Received from [facility]  Depression [phone]): Low Risk (10/28/2022)  Financial Resource Strain: Low Risk  (07/29/2023)   Received from [facility]  Social Connections: Moderately Isolated (04/12/2024)  Tobacco Use: Medium Risk (04/12/2024)    Readmission Risk Interventions     No data to display
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
## 2024-04-14 ENCOUNTER — Inpatient Hospital Stay (HOSPITAL_COMMUNITY)

## 2024-04-14 DIAGNOSIS — J9601 Acute respiratory failure with hypoxia: Secondary | ICD-10-CM

## 2024-04-14 LAB — CBC
HCT: 27.8 % — ABNORMAL LOW (ref 39.0–52.0)
Hemoglobin: 9 g/dL — ABNORMAL LOW (ref 13.0–17.0)
MCH: 28.5 pg (ref 26.0–34.0)
MCHC: 32.4 g/dL (ref 30.0–36.0)
MCV: 88 fL (ref 80.0–100.0)
Platelets: 273 K/uL (ref 150–400)
RBC: 3.16 MIL/uL — ABNORMAL LOW (ref 4.22–5.81)
RDW: 15.9 % — ABNORMAL HIGH (ref 11.5–15.5)
WBC: 6.5 K/uL (ref 4.0–10.5)
nRBC: 0 % (ref 0.0–0.2)

## 2024-04-14 LAB — BLOOD GAS, ARTERIAL
Acid-base deficit: 0.7 mmol/L (ref 0.0–2.0)
Bicarbonate: 22.9 mmol/L (ref 20.0–28.0)
Drawn by: 33730
O2 Saturation: 99.7 %
Patient temperature: 36.6
pCO2 arterial: 32 mmHg (ref 32–48)
pH, Arterial: 7.46 — ABNORMAL HIGH (ref 7.35–7.45)
pO2, Arterial: 96 mmHg (ref 83–108)

## 2024-04-14 LAB — BASIC METABOLIC PANEL WITH GFR
Anion gap: 7 (ref 5–15)
BUN: 20 mg/dL (ref 8–23)
CO2: 25 mmol/L (ref 22–32)
Calcium: 8.3 mg/dL — ABNORMAL LOW (ref 8.9–10.3)
Chloride: 102 mmol/L (ref 98–111)
Creatinine, Ser: 1.01 mg/dL (ref 0.61–1.24)
GFR, Estimated: >60 mL/min
Glucose, Bld: 132 mg/dL — ABNORMAL HIGH (ref 70–99)
Potassium: 4.1 mmol/L (ref 3.5–5.1)
Sodium: 134 mmol/L — ABNORMAL LOW (ref 135–145)

## 2024-04-14 LAB — RESPIRATORY PANEL BY PCR

## 2024-04-14 LAB — TYPE AND SCREEN
ABO/RH(D): A POS
Antibody Screen: NEGATIVE

## 2024-04-14 MED ORDER — BUDESONIDE 0.25 MG/2ML IN SUSP
0.2500 mg | Freq: Two times a day (BID) | RESPIRATORY_TRACT | Status: DC
  Administered 2024-04-14 – 2024-04-23 (×19): 0.25 mg via RESPIRATORY_TRACT
  Filled 2024-04-14 (×20): qty 2

## 2024-04-14 MED ORDER — METHOCARBAMOL 1000 MG/10ML IJ SOLN
500.0000 mg | Freq: Four times a day (QID) | INTRAMUSCULAR | Status: DC
  Filled 2024-04-14 (×5): qty 5

## 2024-04-14 MED ORDER — PREDNISONE 20 MG PO TABS
40.0000 mg | ORAL_TABLET | Freq: Every day | ORAL | Status: AC
  Administered 2024-04-15 – 2024-04-19 (×5): 40 mg via ORAL
  Filled 2024-04-14 (×5): qty 2

## 2024-04-14 MED ORDER — METHOCARBAMOL 500 MG PO TABS
750.0000 mg | ORAL_TABLET | Freq: Four times a day (QID) | ORAL | Status: DC
  Administered 2024-04-14 – 2024-04-23 (×35): 750 mg via ORAL
  Filled 2024-04-14 (×36): qty 2

## 2024-04-14 MED ORDER — IPRATROPIUM-ALBUTEROL 0.5-2.5 (3) MG/3ML IN SOLN
3.0000 mL | Freq: Four times a day (QID) | RESPIRATORY_TRACT | Status: DC
  Administered 2024-04-14 – 2024-04-23 (×35): 3 mL via RESPIRATORY_TRACT
  Filled 2024-04-14 (×36): qty 3
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
## 2024-04-14 NOTE — Progress Notes (Signed)
 Pt having increased oxygen demand, initially requiring 4L [place] supplemental O2, had to increase to 10L [place], pt got up to void and O2 saturations decreased again, pt now on non-rebreather mask. Notified MD on-call, notified RT. Pt's oxygen saturations now maintaining at 97% at rest.   Lonell LITTIE Lyme, RN

## 2024-04-15 ENCOUNTER — Inpatient Hospital Stay (HOSPITAL_COMMUNITY)

## 2024-04-15 LAB — CBC
HCT: 29 % — ABNORMAL LOW (ref 39.0–52.0)
Hemoglobin: 9.2 g/dL — ABNORMAL LOW (ref 13.0–17.0)
MCH: 28.2 pg (ref 26.0–34.0)
MCHC: 31.7 g/dL (ref 30.0–36.0)
MCV: 89 fL (ref 80.0–100.0)
Platelets: 332 K/uL (ref 150–400)
RBC: 3.26 MIL/uL — ABNORMAL LOW (ref 4.22–5.81)
RDW: 16.2 % — ABNORMAL HIGH (ref 11.5–15.5)
WBC: 9 K/uL (ref 4.0–10.5)
nRBC: 0 % (ref 0.0–0.2)

## 2024-04-15 LAB — BASIC METABOLIC PANEL WITH GFR
Anion gap: 6 (ref 5–15)
BUN: 14 mg/dL (ref 8–23)
CO2: 26 mmol/L (ref 22–32)
Calcium: 8.5 mg/dL — ABNORMAL LOW (ref 8.9–10.3)
Chloride: 102 mmol/L (ref 98–111)
Creatinine, Ser: 0.82 mg/dL (ref 0.61–1.24)
GFR, Estimated: >60 mL/min
Glucose, Bld: 93 mg/dL (ref 70–99)
Potassium: 4.8 mmol/L (ref 3.5–5.1)
Sodium: 134 mmol/L — ABNORMAL LOW (ref 135–145)

## 2024-04-15 MED ORDER — FUROSEMIDE 10 MG/ML IJ SOLN
20.0000 mg | Freq: Once | INTRAMUSCULAR | Status: AC
  Administered 2024-04-15: 20 mg via INTRAVENOUS
  Filled 2024-04-15: qty 2

## 2024-04-15 NOTE — Care Management Important Message (Signed)
 Important Message  Patient Details  Name: Jeremy Bergland Sr. MRN: [id number] Date of Birth: 1951-01-15   Important Message Given:  Yes - Medicare IM     Sharonica Kraszewski 04/15/2024, 2:20 PM
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
## 2024-04-15 NOTE — Plan of Care (Signed)
  Problem: Health Behavior/Discharge Planning: Goal: Ability to manage health-related needs will improve Outcome: Progressing   Problem: Clinical Measurements: Goal: Will remain free from infection Outcome: Progressing Goal: Diagnostic test results will improve Outcome: Progressing   Problem: Nutrition: Goal: Adequate nutrition will be maintained Outcome: Progressing   Problem: Coping: Goal: Level of anxiety will decrease Outcome: Progressing

## 2024-04-15 NOTE — Progress Notes (Signed)
 CT removed with no complications, applied a steri strip, gauze and tape to incision site.
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
## 2024-04-16 LAB — EXPECTORATED SPUTUM ASSESSMENT W GRAM STAIN, RFLX TO RESP C

## 2024-04-16 LAB — CBC
HCT: 25.7 % — ABNORMAL LOW (ref 39.0–52.0)
Hemoglobin: 8.3 g/dL — ABNORMAL LOW (ref 13.0–17.0)
MCH: 27.9 pg (ref 26.0–34.0)
MCHC: 32.3 g/dL (ref 30.0–36.0)
MCV: 86.2 fL (ref 80.0–100.0)
Platelets: 362 K/uL (ref 150–400)
RBC: 2.98 MIL/uL — ABNORMAL LOW (ref 4.22–5.81)
RDW: 16.5 % — ABNORMAL HIGH (ref 11.5–15.5)
WBC: 12.2 K/uL — ABNORMAL HIGH (ref 4.0–10.5)
nRBC: 0 % (ref 0.0–0.2)

## 2024-04-16 LAB — BASIC METABOLIC PANEL WITH GFR
Anion gap: 8 (ref 5–15)
BUN: 16 mg/dL (ref 8–23)
CO2: 26 mmol/L (ref 22–32)
Calcium: 8.5 mg/dL — ABNORMAL LOW (ref 8.9–10.3)
Chloride: 101 mmol/L (ref 98–111)
Creatinine, Ser: 0.81 mg/dL (ref 0.61–1.24)
GFR, Estimated: >60 mL/min
Glucose, Bld: 121 mg/dL — ABNORMAL HIGH (ref 70–99)
Potassium: 3.8 mmol/L (ref 3.5–5.1)
Sodium: 135 mmol/L (ref 135–145)

## 2024-04-16 MED ORDER — SODIUM CHLORIDE 0.9 % IV SOLN
2.0000 g | Freq: Three times a day (TID) | INTRAVENOUS | Status: DC
  Administered 2024-04-16 – 2024-04-19 (×10): 2 g via INTRAVENOUS
  Filled 2024-04-16 (×10): qty 12.5
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
## 2024-04-16 NOTE — Plan of Care (Signed)
  Problem: Clinical Measurements: Goal: Will remain free from infection Outcome: Progressing   Problem: Nutrition: Goal: Adequate nutrition will be maintained Outcome: Progressing   Problem: Coping: Goal: Level of anxiety will decrease Outcome: Progressing   Problem: Pain Managment: Goal: General experience of comfort will improve and/or be controlled Outcome: Progressing   Problem: Safety: Goal: Ability to remain free from injury will improve Outcome: Progressing
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
## 2024-04-17 DIAGNOSIS — J9601 Acute respiratory failure with hypoxia: Secondary | ICD-10-CM | POA: Diagnosis not present

## 2024-04-17 DIAGNOSIS — Z720 Tobacco use: Secondary | ICD-10-CM | POA: Diagnosis not present

## 2024-04-17 DIAGNOSIS — J189 Pneumonia, unspecified organism: Secondary | ICD-10-CM

## 2024-04-17 DIAGNOSIS — J439 Emphysema, unspecified: Secondary | ICD-10-CM

## 2024-04-17 LAB — MRSA NEXT GEN BY PCR, NASAL: MRSA by PCR Next Gen: NOT DETECTED

## 2024-04-17 MED ORDER — ALBUTEROL SULFATE (2.5 MG/3ML) 0.083% IN NEBU
2.5000 mg | INHALATION_SOLUTION | RESPIRATORY_TRACT | Status: DC | PRN
  Administered 2024-04-20: 2.5 mg via RESPIRATORY_TRACT
  Filled 2024-04-17: qty 3

## 2024-04-17 MED ORDER — LINEZOLID 600 MG/300ML IV SOLN
600.0000 mg | Freq: Two times a day (BID) | INTRAVENOUS | Status: DC
  Administered 2024-04-17 – 2024-04-19 (×5): 600 mg via INTRAVENOUS
  Filled 2024-04-17 (×6): qty 300
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
## 2024-04-17 NOTE — Plan of Care (Signed)
  Problem: Education: Goal: Knowledge of General Education information will improve Description: Including pain rating scale, medication(s)/side effects and non-pharmacologic comfort measures Outcome: Progressing   Problem: Nutrition: Goal: Adequate nutrition will be maintained Outcome: Progressing   Problem: Coping: Goal: Level of anxiety will decrease Outcome: Progressing   Problem: Elimination: Goal: Will not experience complications related to urinary retention Outcome: Progressing
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
## 2024-04-18 ENCOUNTER — Inpatient Hospital Stay (HOSPITAL_COMMUNITY)

## 2024-04-18 DIAGNOSIS — R410 Disorientation, unspecified: Secondary | ICD-10-CM

## 2024-04-18 LAB — BASIC METABOLIC PANEL WITH GFR
Anion gap: 10 (ref 5–15)
BUN: 12 mg/dL (ref 8–23)
CO2: 24 mmol/L (ref 22–32)
Calcium: 8.8 mg/dL — ABNORMAL LOW (ref 8.9–10.3)
Chloride: 100 mmol/L (ref 98–111)
Creatinine, Ser: 0.72 mg/dL (ref 0.61–1.24)
GFR, Estimated: >60 mL/min
Glucose, Bld: 96 mg/dL (ref 70–99)
Potassium: 4.1 mmol/L (ref 3.5–5.1)
Sodium: 134 mmol/L — ABNORMAL LOW (ref 135–145)

## 2024-04-18 LAB — BLOOD GAS, ARTERIAL
Acid-Base Excess: 0.9 mmol/L (ref 0.0–2.0)
Bicarbonate: 23.8 mmol/L (ref 20.0–28.0)
O2 Saturation: 99.4 %
Patient temperature: 36.9
pCO2 arterial: 32 mmHg (ref 32–48)
pH, Arterial: 7.48 — ABNORMAL HIGH (ref 7.35–7.45)
pO2, Arterial: 100 mmHg (ref 83–108)

## 2024-04-18 LAB — CBC
HCT: 27.9 % — ABNORMAL LOW (ref 39.0–52.0)
Hemoglobin: 9.1 g/dL — ABNORMAL LOW (ref 13.0–17.0)
MCH: 27.9 pg (ref 26.0–34.0)
MCHC: 32.6 g/dL (ref 30.0–36.0)
MCV: 85.6 fL (ref 80.0–100.0)
Platelets: 477 K/uL — ABNORMAL HIGH (ref 150–400)
RBC: 3.26 MIL/uL — ABNORMAL LOW (ref 4.22–5.81)
RDW: 17.2 % — ABNORMAL HIGH (ref 11.5–15.5)
WBC: 15.8 K/uL — ABNORMAL HIGH (ref 4.0–10.5)
nRBC: 0 % (ref 0.0–0.2)

## 2024-04-18 MED ORDER — ENSURE PLUS HIGH PROTEIN PO LIQD
237.0000 mL | Freq: Three times a day (TID) | ORAL | Status: DC
  Administered 2024-04-18 – 2024-04-23 (×13): 237 mL via ORAL

## 2024-04-18 NOTE — Plan of Care (Signed)

## 2024-04-18 NOTE — Progress Notes (Signed)
 CCM called after overnight event where Pt SpO2 dropped and new agitation noticed in report.

## 2024-04-18 NOTE — Progress Notes (Signed)
 On HHFNC. Earlier with transferring to [facility], patient's oxygen saturation decreased into the 70s to mid 80s, 73% at the lowest. Once back in bed he did recovery to 90-91%. This was a noticeable difference from earlier in the shift, where patient only dropped into mid 80s with exercise and had a quick recovery to mid 90s. Patient also a bit more anxious with moments of what appear to be confusion. Also witnessed to have some involuntary small jerking and twitching motions while resting.     E-link called and notified.
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
## 2024-04-18 NOTE — Progress Notes (Signed)
 RT note. ABG collected and sent to lab at this time, lab notified.  Patient oxygen ^ to 50%-35L due to desat 86%. RN aware, RT will continue to monitor.    04/18/24 0826  Therapy Vitals  Pulse Rate 98  Resp (!) 23  MEWS Score/Color  MEWS Score 1  MEWS Score Color Green  Oxygen Therapy/Pulse Ox  O2 Device HHFNC  SpO2 90 %  O2 Therapy Oxygen humidified  O2 Flow Rate (L/min) (S)  35 L/min  FiO2 (%) (S)  50 %

## 2024-04-18 NOTE — Plan of Care (Signed)
  Problem: Education: Goal: Knowledge of General Education information will improve Description: Including pain rating scale, medication(s)/side effects and non-pharmacologic comfort measures Outcome: Progressing   Problem: Clinical Measurements: Goal: Ability to maintain clinical measurements within normal limits will improve Outcome: Progressing Goal: Respiratory complications will improve Outcome: Progressing   Problem: Nutrition: Goal: Adequate nutrition will be maintained Outcome: Progressing   Problem: Pain Managment: Goal: General experience of comfort will improve and/or be controlled Outcome: Progressing
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
## 2024-04-19 LAB — CBC
HCT: 27.2 % — ABNORMAL LOW (ref 39.0–52.0)
Hemoglobin: 8.9 g/dL — ABNORMAL LOW (ref 13.0–17.0)
MCH: 28.3 pg (ref 26.0–34.0)
MCHC: 32.7 g/dL (ref 30.0–36.0)
MCV: 86.3 fL (ref 80.0–100.0)
Platelets: 584 K/uL — ABNORMAL HIGH (ref 150–400)
RBC: 3.15 MIL/uL — ABNORMAL LOW (ref 4.22–5.81)
RDW: 17.3 % — ABNORMAL HIGH (ref 11.5–15.5)
WBC: 19.7 K/uL — ABNORMAL HIGH (ref 4.0–10.5)
nRBC: 0 % (ref 0.0–0.2)

## 2024-04-19 LAB — CULTURE, RESPIRATORY W GRAM STAIN

## 2024-04-19 MED ORDER — POLYETHYLENE GLYCOL 3350 17 G PO PACK
17.0000 g | PACK | Freq: Three times a day (TID) | ORAL | Status: DC
  Administered 2024-04-19 – 2024-04-20 (×4): 17 g via ORAL
  Filled 2024-04-19 (×4): qty 1

## 2024-04-19 MED ORDER — CEFAZOLIN SODIUM-DEXTROSE 2-4 GM/100ML-% IV SOLN
2.0000 g | Freq: Three times a day (TID) | INTRAVENOUS | Status: DC
  Administered 2024-04-19 – 2024-04-23 (×11): 2 g via INTRAVENOUS
  Filled 2024-04-19 (×11): qty 100
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
## 2024-04-19 NOTE — Progress Notes (Signed)
" °   04/19/24 2053  Oxygen Therapy/Pulse Ox  O2 Flow Rate (L/min) 30 L/min   Increased due to increase in WOB "

## 2024-04-19 NOTE — Progress Notes (Signed)
 Pharmacy antibiotic note -   Respiratory culture growing MSSA.  Patient currently on cefepime  and linezolid .  Chat sent to Ozell Shaper PA-C requesting to narrow to cefazolin .  Orders received for cefazolin  2g IV q8h and stop cefepime  and linezolid .  Venetia Gully, PharmD, [facility] Clinical Pharmacist Phone [phone]

## 2024-04-19 NOTE — Plan of Care (Signed)
  Problem: Activity: Goal: Risk for activity intolerance will decrease Outcome: Progressing   Problem: Nutrition: Goal: Adequate nutrition will be maintained Outcome: Progressing   Problem: Coping: Goal: Level of anxiety will decrease Outcome: Progressing   Problem: Pain Managment: Goal: General experience of comfort will improve and/or be controlled Outcome: Progressing   Problem: Safety: Goal: Ability to remain free from injury will improve Outcome: Progressing
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
## 2024-04-19 NOTE — Progress Notes (Signed)
 PT Cancellation Note  Patient Details Name: Jeremy Harrell. MRN: [id number] DOB: 1950/07/10   Cancelled Treatment:    Reason Eval/Treat Not Completed: (P) Fatigue/lethargy limiting ability to participate, pt stating he just got back to bed from being up and around the room and in the chair for most of day, asking to rest before getting up again. Will check back as schedule allows to continue with PT POC.  Therisa SAUNDERS. PTA Acute Rehabilitation Services Office: [phone]    Therisa CHRISTELLA Boor 04/19/2024, 4:17 PM

## 2024-04-19 NOTE — TOC Progression Note (Signed)
 Transition of Care (TOC) - Progression Note    Patient Details  Name: Jeremy Harrell. MRN: [id number] Date of Birth: 04-18-1950  Transition of Care [facility]) CM/SW Contact  Zarin Hagmann M, RN Phone Number: 04/19/2024, 12:20pm  Clinical Narrative:    Met with patient; he is still on high flow oxygen and IV antibiotics.  He is complaining of not being able to chew his food due to not having any teeth.  He is drinking meal supplements and eating soup currently.  Will discuss this issue with MD/SLP to see if diet needs to be changed.  Mr. Latner is worried about his upcoming court date on January 21.  Offered to call his wife to see if she can give me the information from the summons.  He gives me permission to do this. He is appreciative of my visit.  Addendum: 3:35pm Spoke with patient's wife, Earnie, and she is able to provide information from paperwork regarding patient's court date.  Court date is Jan 21 at 8:55 at [facility].  Left message for Beverley Romano with Criminal Division requesting fax number to fax excuse letter. Will follow with updates.     Expected Discharge Plan: Home w Home Health Services Barriers to Discharge: Continued Medical Work up               Expected Discharge Plan and Services   Discharge Planning Services: CM Consult Post Acute Care Choice: Home Health Living arrangements for the past 2 months: Single Family Home                           HH Arranged: PT, OT HH Agency: [facility] Date [facility] Agency Contacted: 04/13/24 Time HH Agency Contacted: 1415 Representative spoke with at [facility] Agency: Hedda   Social Drivers of Health (SDOH) Interventions SDOH Screenings   Food Insecurity: No Food Insecurity (04/12/2024)  Housing: Low Risk (04/12/2024)  Transportation Needs: No Transportation Needs (07/29/2023)   Received from [facility]  Utilities: Not At Risk (07/29/2023)   Received from [facility]  Depression [phone]): Low Risk (10/28/2022)  Financial Resource Strain: Low Risk  (07/29/2023)   Received from [facility]  Social Connections: Moderately Isolated (04/12/2024)  Tobacco Use: Medium Risk (04/12/2024)    Readmission Risk Interventions     No data to display         Mliss MICAEL Fass, RN, BSN  Trauma/Neuro ICU Case Manager [phone]

## 2024-04-20 ENCOUNTER — Inpatient Hospital Stay (HOSPITAL_COMMUNITY)

## 2024-04-20 LAB — URINALYSIS, ROUTINE W REFLEX MICROSCOPIC
Bilirubin Urine: NEGATIVE
Glucose, UA: NEGATIVE mg/dL
Hgb urine dipstick: NEGATIVE
Ketones, ur: NEGATIVE mg/dL
Leukocytes,Ua: NEGATIVE
Nitrite: NEGATIVE
Protein, ur: NEGATIVE mg/dL
Specific Gravity, Urine: 1.018 (ref 1.005–1.030)
pH: 6 (ref 5.0–8.0)

## 2024-04-20 MED ORDER — POLYETHYLENE GLYCOL 3350 17 G PO PACK
17.0000 g | PACK | Freq: Every day | ORAL | Status: DC
  Administered 2024-04-21 – 2024-04-23 (×3): 17 g via ORAL
  Filled 2024-04-20 (×3): qty 1
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
## 2024-04-20 NOTE — Progress Notes (Signed)
 SLP Cancellation Note  Patient Details Name: Jeremy Barabas Sr. MRN: [id number] DOB: 1950/12/21   Cancelled treatment:       Reason Eval/Treat Not Completed: Other (comment). Did not catch patient with a meal today, but checked in with RN and she reported pt was very happy with minced and moist diet texture, said it was much better. Hopeful that pt is tolerating and able to increase oral intake today. Will f/u x1. Pt sleeping this pm.     Harish Bram, Consuelo Fitch 04/20/2024, 3:14 PM

## 2024-04-21 MED ORDER — NYSTATIN 100000 UNIT/ML MT SUSP
5.0000 mL | Freq: Four times a day (QID) | OROMUCOSAL | Status: DC
  Administered 2024-04-21 – 2024-04-23 (×7): 500000 [IU] via ORAL
  Filled 2024-04-21 (×5): qty 5

## 2024-04-21 NOTE — Progress Notes (Signed)
 Mobility Specialist Progress Note:   04/21/24 1000  Mobility  Activity Ambulated with assistance  Level of Assistance Contact guard assist, steadying assist  Assistive Device Front wheel walker  Distance Ambulated (ft) 25 ft  Activity Response Tolerated fair  Mobility Referral Yes  Mobility visit 1 Mobility  Mobility Specialist Start Time (ACUTE ONLY) [id number]  Mobility Specialist Stop Time (ACUTE ONLY) 0951  Mobility Specialist Time Calculation (min) (ACUTE ONLY) 23 min   Pt received in bed agreeable to mobility. Found on 2L/min. No physical assistance needed contact guard for safety. Seated EOB pt c/o dizziness (see BP below). Attempted to ambulated on 2L/min, d/t desat to 77% O2 flow increased to 8L/min to keep SPO2 above 88%. Distance limited d/t SOB. Towards EOS pts gait became very short. Once seated EOB pt became very anxious and started to hyperventilate, MS cued pt on PLB, pt able to regain normal breathing. Pt requested to sit up in recliner. Left w/ call bell and personal belongings in reach. Chair alarm on. Left on 4L/min. RN aware.   Pre Mobility 2L/min SPO2 94% During Mobility 2L/min SPO2 77%; seated EOB BP 112/63 (78)                          8L/min SPO2 90% Post Mobility 4L/min SPO2 93%  Thersia Minder Mobility Specialist  Please contact vis Secure Chat or  Rehab Office [phone]

## 2024-04-21 NOTE — Plan of Care (Signed)

## 2024-04-21 NOTE — Plan of Care (Signed)
  Problem: Clinical Measurements: Goal: Diagnostic test results will improve Outcome: Progressing Goal: Respiratory complications will improve Outcome: Progressing Goal: Cardiovascular complication will be avoided Outcome: Progressing   Problem: Activity: Goal: Risk for activity intolerance will decrease Outcome: Progressing   Problem: Nutrition: Goal: Adequate nutrition will be maintained Outcome: Progressing   Problem: Pain Managment: Goal: General experience of comfort will improve and/or be controlled Outcome: Progressing
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
## 2024-04-22 NOTE — Progress Notes (Signed)
 Pt complians of back pain. RN suggest back brace to be put on patient. Patient refused. RN educated patient on the benefit of wearing brace.

## 2024-04-22 NOTE — TOC Progression Note (Signed)
 Transition of Care (TOC) - Progression Note    Patient Details  Name: Jeremy Emmanuel Sr. MRN: [id number] Date of Birth: 11-17-1950  Transition of Care [facility]) CM/SW Contact  Jeremy Planck M, RN Phone Number: 04/22/2024, 4:47 PM  Clinical Narrative:    Patient down to 4L/[place] of oxygen.  SNF recommended, but patient is refusing.  May be able to discharge home tomorrow with [facility] services and home oxygen; will add HHRN to previous HH orders- requested from [facility]  Referral to [employer] for home oxygen; will need ambulatory sat with initial room air sat for insurance authorization.    Expected Discharge Plan: Home w Home Health Services Barriers to Discharge: Continued Medical Work up               Expected Discharge Plan and Services   Discharge Planning Services: CM Consult Post Acute Care Choice: Home Health Living arrangements for the past 2 months: Single Family Home                 DME Arranged: Oxygen DME Agency: [facility] Date DME Agency Contacted: 04/22/24 Time DME Agency Contacted: [phone] Representative spoke with at DME Agency: Ryan Breed HH Arranged: PT, OT, RN, Disease Management HH Agency: [facility] Date [facility] Agency Contacted: 04/13/24 Time HH Agency Contacted: [phone] Representative spoke with at [facility] Agency: Darleene Gowda   Social Drivers of Health (SDOH) Interventions SDOH Screenings   Food Insecurity: No Food Insecurity (04/12/2024)  Housing: Low Risk (04/12/2024)  Transportation Needs: No Transportation Needs (07/29/2023)   Received from [facility]  Utilities: Not At Risk (07/29/2023)   Received from [facility]  Depression [phone]): Low Risk (10/28/2022)  Financial Resource Strain: Low Risk  (07/29/2023)   Received from [facility]  Social Connections: Moderately Isolated (04/12/2024)  Tobacco Use: Medium Risk (04/12/2024)    Readmission Risk Interventions     No data to  display         Mliss MICAEL Fass, RN, BSN  Trauma/Neuro ICU Case Manager [phone]

## 2024-04-22 NOTE — Plan of Care (Signed)
" °  Problem: Clinical Measurements: Goal: Diagnostic test results will improve Outcome: Progressing Goal: Respiratory complications will improve Outcome: Progressing Goal: Cardiovascular complication will be avoided Outcome: Progressing   Problem: Coping: Goal: Level of anxiety will decrease Outcome: Progressing   Problem: Elimination: Goal: Will not experience complications related to bowel motility Outcome: Progressing Goal: Will not experience complications related to urinary retention Outcome: Progressing   "
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
## 2024-04-22 NOTE — Progress Notes (Signed)
 SATURATION QUALIFICATIONS: (This note is used to comply with regulatory documentation for home oxygen)  Patient Saturations on 3L at Rest = 97%  Patient Saturations on 4L while Ambulating = 72-89%  Patient Saturations on 6 Liters of oxygen while Ambulating = 91-95%  Please briefly explain why patient needs home oxygen:Pt desaturates with mobility and requires increased supplemental oxygen to maintain a safe saturation level.  Therisa SAUNDERS. PTA Acute Rehabilitation Services Office: [phone]
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
## 2024-04-23 ENCOUNTER — Telehealth (HOSPITAL_COMMUNITY): Payer: Self-pay

## 2024-04-23 ENCOUNTER — Other Ambulatory Visit (HOSPITAL_COMMUNITY): Payer: Self-pay

## 2024-04-23 MED ORDER — ALBUTEROL SULFATE HFA 108 (90 BASE) MCG/ACT IN AERS
2.0000 | INHALATION_SPRAY | Freq: Four times a day (QID) | RESPIRATORY_TRACT | 0 refills | Status: AC | PRN
  Filled 2024-04-23: qty 6.7, 25d supply, fill #0

## 2024-04-23 MED ORDER — OXYCODONE HCL 5 MG PO TABS
5.0000 mg | ORAL_TABLET | Freq: Four times a day (QID) | ORAL | 0 refills | Status: AC | PRN
  Filled 2024-04-23: qty 15, 4d supply, fill #0

## 2024-04-23 MED ORDER — FOLIC ACID 1 MG PO TABS
1.0000 mg | ORAL_TABLET | Freq: Every day | ORAL | 0 refills | Status: AC
  Filled 2024-04-23: qty 30, 30d supply, fill #0

## 2024-04-23 MED ORDER — GABAPENTIN 100 MG PO CAPS
200.0000 mg | ORAL_CAPSULE | Freq: Three times a day (TID) | ORAL | 0 refills | Status: AC | PRN
  Filled 2024-04-23: qty 60, 10d supply, fill #0

## 2024-04-23 MED ORDER — GUAIFENESIN ER 600 MG PO TB12
600.0000 mg | ORAL_TABLET | Freq: Two times a day (BID) | ORAL | 0 refills | Status: AC | PRN
  Filled 2024-04-23: qty 30, 15d supply, fill #0

## 2024-04-23 MED ORDER — THIAMINE HCL 100 MG PO TABS
100.0000 mg | ORAL_TABLET | Freq: Every day | ORAL | 0 refills | Status: AC
  Filled 2024-04-23: qty 30, 30d supply, fill #0

## 2024-04-23 MED ORDER — POLYETHYLENE GLYCOL 3350 17 G PO PACK: 17.0000 g | PACK | Freq: Every day | ORAL | Status: AC | PRN

## 2024-04-23 MED ORDER — CEPHALEXIN 500 MG PO CAPS
500.0000 mg | ORAL_CAPSULE | Freq: Once | ORAL | Status: DC
  Filled 2024-04-23: qty 1

## 2024-04-23 MED ORDER — METHOCARBAMOL 750 MG PO TABS
750.0000 mg | ORAL_TABLET | Freq: Four times a day (QID) | ORAL | 0 refills | Status: AC | PRN
  Filled 2024-04-23: qty 60, 15d supply, fill #0

## 2024-04-23 MED ORDER — ACETAMINOPHEN 500 MG PO TABS: 1000.0000 mg | ORAL_TABLET | Freq: Three times a day (TID) | ORAL | Status: AC | PRN

## 2024-04-23 MED ORDER — STIOLTO RESPIMAT 2.5-2.5 MCG/ACT IN AERS
2.0000 | INHALATION_SPRAY | Freq: Every day | RESPIRATORY_TRACT | 12 refills | Status: AC
  Filled 2024-04-23: qty 4, 30d supply, fill #0
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
## 2024-04-23 NOTE — Telephone Encounter (Signed)
 Pharmacy Patient Advocate Encounter  Insurance verification completed.    The patient is insured through [employer]/ [employer]. Patient has Medicare and is not eligible for a copay card, but may be able to apply for patient assistance or Medicare RX Payment Plan (Patient Must reach out to their plan, if eligible for payment plan), if available.    Ran test claim for Anoro Ellipta 62.5-25mcg and the current 30 day co-pay is $93.84.  Ran test claim for Stiolto 2.5-2.35mcg and the current 30 day co-pay is $92.80.  This test claim was processed through [facility]- copay amounts may vary at other pharmacies due to pharmacy/plan contracts, or as the patient moves through the different stages of their insurance plan.
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
## 2024-04-23 NOTE — Discharge Instructions (Addendum)
 Continue dysphagia diet at discharge until told otherwise by your speech therapist   You may wear LSO corset for comfort.   PNEUMOTHORAX OR HEMOTHORAX +/- RIB FRACTURES  HOME INSTRUCTIONS   PAIN CONTROL:  Pain is best controlled by a usual combination of three different methods TOGETHER:  Ice/Heat Over the counter pain medication Prescription pain medication You may experience some swelling and bruising in area of broken ribs. Ice packs or heating pads (30-60 minutes up to 6 times a day) will help. Use ice for the first few days to help decrease swelling and bruising, then switch to heat to help relax tight/sore spots and speed recovery. Some people prefer to use ice alone, heat alone, alternating between ice & heat. Experiment to what works for you. Swelling and bruising can take several weeks to resolve.  It is helpful to take an over-the-counter pain medication regularly for the first few weeks. Choose one of the following that works best for you:  Naproxen (Aleve, etc) Two 220mg  tabs twice a day Ibuprofen (Advil, etc) Three 200mg  tabs four times a day (every meal & bedtime) Acetaminophen  (Tylenol , etc) 500-650mg  four times a day (every meal & bedtime) A prescription for pain medication (such as oxycodone , hydrocodone, etc) may be given to you upon discharge. Take your pain medication as prescribed.  If you are having problems/concerns with the prescription medicine (does not control pain, nausea, vomiting, rash, itching, etc), please call us  [phone] to see if we need to switch you to a different pain medicine that will work better for you and/or control your side effect better. If you need a refill on your pain medication, please contact your pharmacy. They will contact our office to request authorization. Prescriptions will not be filled after 5 pm or on week-ends. Avoid getting constipated. When taking pain medications, it is common to experience some constipation. Increasing fluid  intake and taking a fiber supplement (such as Metamucil, Citrucel, FiberCon, MiraLax , etc) 1-2 times a day regularly will usually help prevent this problem from occurring. A mild laxative (prune juice, Milk of Magnesia, MiraLax , etc) should be taken according to package directions if there are no bowel movements after 48 hours.  Watch out for diarrhea. If you have many loose bowel movements, simplify your diet to bland foods & liquids for a few days. Stop any stool softeners and decrease your fiber supplement. Switching to mild anti-diarrheal medications (Kayopectate, Pepto Bismol) can help. If this worsens or does not improve, please call us . Chest tube site wound: you may remove the dressing from your chest tube site 3 days after the removal of your chest tube. DO NOT shower over the dressing. Once   removed, you may shower as normal. Do not submerge your wound in water for 2-3 weeks.  FOLLOW UP  Please call our office to set up or confirm an appointment for follow up for 2 weeks after discharge. You will need to get a chest xray at either [facility] or [facility]. This will be outlined in your follow up instructions. Please call [facility] at [phone] if you have any questions about follow up.  If you have any orthopedic or other injuries you will need to follow up as outlined in your follow up instructions.   WHEN TO CALL US  [phone]:  Poor pain control Reactions / problems with new medications (rash/itching, nausea, etc)  Fever over 101.5 F (38.5 C) Worsening swelling or bruising Redness, drainage, pain or swelling around chest tube  site Worsening pain, productive cough, difficulty breathing or any other concerning symptoms  The clinic staff is available to answer your questions during regular business hours (8:30am-5pm). Please dont hesitate to call and ask to speak to one of our nurses for clinical concerns.  If you have a medical emergency, go to the nearest emergency room  or call 911.  A surgeon from [facility] is always on call at the [facility], [place]  [address], [place], [place] [address] ?  MAIN: [phone] ? TOLL FREE: [phone] ?  FAX [phone]  [email or web address]      Information on Rib Fractures  A rib fracture is a break or crack in one of the bones of the ribs. The ribs are long, curved bones that wrap around your chest and attach to your spine and your breastbone. The ribs protect your heart, lungs, and other organs in the chest. A broken or cracked rib is often painful but is not usually serious. Most rib fractures heal on their own over time. However, rib fractures can be more serious if multiple ribs are broken or if broken ribs move out of place and push against other structures or organs. What are the causes? This condition is caused by: Repetitive movements with high force, such as pitching a baseball or having severe coughing spells. A direct blow to the chest, such as a sports injury, a car accident, or a fall. Cancer that has spread to the bones, which can weaken bones and cause them to break. What are the signs or symptoms? Symptoms of this condition include: Pain when you breathe in or cough. Pain when someone presses on the injured area. Feeling short of breath. How is this diagnosed? This condition is diagnosed with a physical exam and medical history. Imaging tests may also be done, such as: Chest X-ray. CT scan. MRI. Bone scan. Chest ultrasound. How is this treated? Treatment for this condition depends on the severity of the fracture. Most rib fractures usually heal on their own in 1-3 months. Sometimes healing takes longer if there is a cough that does not stop or if there are other activities that make the injury worse (aggravating factors). While you heal, you will be given medicines to control the pain. You will also be taught deep  breathing exercises. Severe injuries may require hospitalization or surgery. Follow these instructions at home: Managing pain, stiffness, and swelling If directed, apply ice to the injured area. Put ice in a plastic bag. Place a towel between your skin and the bag. Leave the ice on for 20 minutes, 2-3 times a day. Take over-the-counter and prescription medicines only as told by your health care provider. Activity Avoid a lot of activity and any activities or movements that cause pain. Be careful during activities and avoid bumping the injured rib. Slowly increase your activity as told by your health care provider. General instructions Do deep breathing exercises as told by your health care provider. This helps prevent pneumonia, which is a common complication of a broken rib. Your health care provider may instruct you to: Take deep breaths several times a day. Try to cough several times a day, holding a pillow against the injured area. Use a device called incentive spirometer to practice deep breathing several times a day. Drink enough fluid to keep your urine pale yellow. Do not wear a rib belt or binder. These restrict breathing, which can lead to pneumonia. Keep all  follow-up visits as told by your health care provider. This is important. Contact a health care provider if: You have a fever. Get help right away if: You have difficulty breathing or you are short of breath. You develop a cough that does not stop, or you cough up thick or bloody sputum. You have nausea, vomiting, or pain in your abdomen. Your pain gets worse and medicine does not help. Summary A rib fracture is a break or crack in one of the bones of the ribs. A broken or cracked rib is often painful but is not usually serious. Most rib fractures heal on their own over time. Treatment for this condition depends on the severity of the fracture. Avoid a lot of activity and any activities or movements that cause  pain. This information is not intended to replace advice given to you by your health care provider. Make sure you discuss any questions you have with your health care provider. Document Released: 03/25/2005 Document Revised: 06/24/2016 Document Reviewed: 06/24/2016 Elsevier Interactive Patient Education  2019 [employer]    Pneumothorax A pneumothorax is commonly called a collapsed lung. It is a condition in which air leaks from a lung and builds up between the thin layer of tissue that covers the lungs (visceral pleura) and the interior wall of the chest cavity (parietal pleura). The air gets trapped outside the lung, between the lung and the chest wall (pleural space). The air takes up space and prevents the lung from fully expanding. This condition sometimes occurs suddenly with no apparent cause. The buildup of air may be small or large. A small pneumothorax may go away on its own. A large pneumothorax will require treatment and hospitalization. What are the causes? This condition may be caused by: Trauma and injury to the chest wall. Surgery and other medical procedures. A complication of an underlying lung problem, especially chronic obstructive pulmonary disease (COPD) or emphysema. Sometimes the cause of this condition is not known. What increases the risk? You are more likely to develop this condition if: You have an underlying lung problem. You smoke. You are 13-62 years old, male, tall, and underweight. You have a personal or family history of pneumothorax. You have an eating disorder (anorexia nervosa). This condition can also happen quickly, even in people with no history of lung problems. What are the signs or symptoms? Sometimes a pneumothorax will have no symptoms. When symptoms are present, they can include: Chest pain. Shortness of breath. Increased rate of breathing. Bluish color to your lips or skin (cyanosis). How is this diagnosed? This condition may be  diagnosed by: A medical history and physical exam. A chest X-ray, chest CT scan, or ultrasound. How is this treated? Treatment depends on how severe your condition is. The goal of treatment is to remove the extra air and allow your lung to expand back to its normal size. For a small pneumothorax: No treatment may be needed. Extra oxygen is sometimes used to make it go away more quickly. For a large pneumothorax or a pneumothorax that is causing symptoms, a procedure is done to drain the air from your lungs. To do this, a health care provider may use: A needle with a syringe. This is used to suck air from a pleural space where no additional leakage is taking place. A chest tube. This is used to suck air where there is ongoing leakage into the pleural space. The chest tube may need to remain in place for several days until the  air leak has healed. In more severe cases, surgery may be needed to repair the damage that is causing the leak. If you have multiple pneumothorax episodes or have an air leak that will not heal, a procedure called a pleurodesis may be done. A medicine is placed in the pleural space to irritate the tissues around the lung so that the lung will stick to the chest wall, seal any leaks, and stop any buildup of air in that space. If you have an underlying lung problem, severe symptoms, or a large pneumothorax you will usually need to stay in the hospital. Follow these instructions at home: Lifestyle Do not use any products that contain nicotine or tobacco, such as cigarettes and e-cigarettes. These are major risk factors in pneumothorax. If you need help quitting, ask your health care provider. Do not lift anything that is heavier than 10 lb (4.5 kg), or the limit that your health care provider tells you, until he or she says that it is safe. Avoid activities that take a lot of effort (strenuous) for as long as told by your health care provider. Return to your normal activities as  told by your health care provider. Ask your health care provider what activities are safe for you. Do not fly in an airplane or scuba dive until your health care provider says it is okay. General instructions Take over-the-counter and prescription medicines only as told by your health care provider. If a cough or pain makes it difficult for you to sleep at night, try sleeping in a semi-upright position in a recliner or by using 2 or 3 pillows. If you had a chest tube and it was removed, ask your health care provider when you can remove the bandage (dressing). While the dressing is in place, do not allow it to get wet. Keep all follow-up visits as told by your health care provider. This is important. Contact a health care provider if: You cough up thick mucus (sputum) that is yellow or green in color. You were treated with a chest tube, and you have redness, increasing pain, or discharge at the site where it was placed. Get help right away if: You have increasing chest pain or shortness of breath. You have a cough that will not go away. You begin coughing up blood. You have pain that is getting worse or is not controlled with medicines. The site where your chest tube was located opens up. You feel air coming out of the site where the chest tube was placed. You have a fever or persistent symptoms for more than 2-3 days. You have a fever and your symptoms suddenly get worse. These symptoms may represent a serious problem that is an emergency. Do not wait to see if the symptoms will go away. Get medical help right away. Call your local emergency services (911 in the [place] Do not drive yourself to the hospital. Summary A pneumothorax, commonly called a collapsed lung, is a condition in which air leaks from a lung and gets trapped between the lung and the chest wall (pleural space). The buildup of air may be small or large. A small pneumothorax may go away on its own. A large pneumothorax will  require treatment and hospitalization. Treatment for this condition depends on how severe the pneumothorax is. The goal of treatment is to remove the extra air and allow the lung to expand back to its normal size. This information is not intended to replace advice given to you by your  health care provider. Make sure you discuss any questions you have with your health care provider. Document Released: 03/25/2005 Document Revised: 03/03/2017 Document Reviewed: 03/03/2017 Elsevier Interactive Patient Education  2019 [employer].

## 2024-04-23 NOTE — Progress Notes (Signed)
 SATURATION QUALIFICATIONS: (This note is used to comply with regulatory documentation for home oxygen)  Patient Saturations on Room Air at Rest = 87%  Patient Saturations on Room Air while Ambulating = N/A%  Patient Saturations on 6 Liters of oxygen while Ambulating = >90%  Please briefly explain why patient needs home oxygen:  Pt requires 3L O2 at rest, on 3L during mobility SpO2 dropped to 79%, Pt required 6L O2 during activity/mobility to maintain SpO2 >90% along with cues for mouth closed and breathe in through nose where supplemental O2 is located (Pt is naturally a mouth breather)  Leita DEL OTR/L Acute Rehabilitation Services Office: [phone]

## 2024-04-23 NOTE — Telephone Encounter (Signed)
 Pharmacy Patient Advocate Encounter  Insurance verification completed.    The patient is insured through [employer]/ [employer]. Patient has Medicare and is not eligible for a copay card, but may be able to apply for patient assistance or Medicare RX Payment Plan (Patient Must reach out to their plan, if eligible for payment plan), if available.    Ran test claim for Advair Diskus 100-50mcg and the current 30 day co-pay is $3.  Ran test claim for Symbicort 160-4.5mcg and the current 30 day co-pay is $235.04 due to deductible.  Rand test claim for Dulera 100-5mcg and it requires a prior authorization.   This test claim was processed through [facility]- copay amounts may vary at other pharmacies due to pharmacy/plan contracts, or as the patient moves through the different stages of their insurance plan.

## 2024-04-23 NOTE — TOC Transition Note (Signed)
 Transition of Care [facility]) - Discharge Note   Patient Details  Name: Jeremy Venhuizen Sr. MRN: [id number] Date of Birth: [birth year]  Transition of Care [facility]) CM/SW Contact:  Manpreet Strey M, RN Phone Number: 04/23/2024, 10:09 AM   Clinical Narrative:    Patient medically stable for discharge home with spouse and sons to provide needed assistance.  Notified [facility] of discharge today; they will follow for HHRN/PT and OT.  [facility] has been notified of need for home oxygen at 6L/[place], and order updated.   Patient has a walker at home.    Final next level of care: Home w Home Health Services Barriers to Discharge: Barriers Resolved   Patient Goals and CMS Choice Patient states their goals for this hospitalization and ongoing recovery are:: to go home CMS Medicare.gov Compare Post Acute Care list provided to:: Patient Choice offered to / list presented to : Patient                            Discharge Plan and Services Additional resources added to the After Visit Summary for     Discharge Planning Services: CM Consult Post Acute Care Choice: Home Health          DME Arranged: Oxygen DME Agency: [facility] Date DME Agency Contacted: 04/22/24 Time DME Agency Contacted: [phone] Representative spoke with at DME Agency: Ryan Breed HH Arranged: PT, OT, RN, Disease Management HH Agency: [facility] Date [facility] Agency Contacted: 04/13/24 Time HH Agency Contacted: 1415 Representative spoke with at [facility] Agency: Darleene Gowda  Social Drivers of Health (SDOH) Interventions SDOH Screenings   Food Insecurity: No Food Insecurity (04/12/2024)  Housing: Low Risk (04/12/2024)  Transportation Needs: No Transportation Needs (07/29/2023)   Received from [facility]  Utilities: Not At Risk (07/29/2023)   Received from [facility]  Depression [phone]): Low Risk (10/28/2022)  Financial Resource Strain: Low Risk  (07/29/2023)   Received from  [facility]  Social Connections: Moderately Isolated (04/12/2024)  Tobacco Use: Medium Risk (04/12/2024)     Readmission Risk Interventions     No data to display         Mliss MICAEL Fass, RN, BSN  Trauma/Neuro ICU Case Manager [phone]
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
## 2024-06-02 ENCOUNTER — Ambulatory Visit: Admitting: Physician Assistant
